# Patient Record
Sex: Female | Born: 1955
Health system: Southern US, Community
[De-identification: ages and names within clinical notes are randomized; demographics above are authoritative.]

## PROBLEM LIST (undated history)

## (undated) DIAGNOSIS — C20 Malignant neoplasm of rectum: Secondary | ICD-10-CM

## (undated) DIAGNOSIS — T451X5A Adverse effect of antineoplastic and immunosuppressive drugs, initial encounter: Secondary | ICD-10-CM

## (undated) DIAGNOSIS — Z85048 Personal history of other malignant neoplasm of rectum, rectosigmoid junction, and anus: Secondary | ICD-10-CM

## (undated) DIAGNOSIS — G620 Drug-induced polyneuropathy: Secondary | ICD-10-CM

## (undated) DIAGNOSIS — N870 Mild cervical dysplasia: Secondary | ICD-10-CM

## (undated) DIAGNOSIS — N879 Dysplasia of cervix uteri, unspecified: Secondary | ICD-10-CM

## (undated) DIAGNOSIS — N871 Moderate cervical dysplasia: Secondary | ICD-10-CM

## (undated) DIAGNOSIS — M1711 Unilateral primary osteoarthritis, right knee: Secondary | ICD-10-CM

## (undated) DIAGNOSIS — Z8719 Personal history of other diseases of the digestive system: Secondary | ICD-10-CM

## (undated) DIAGNOSIS — R1013 Epigastric pain: Secondary | ICD-10-CM

## (undated) DIAGNOSIS — R195 Other fecal abnormalities: Secondary | ICD-10-CM

## (undated) DIAGNOSIS — R1032 Left lower quadrant pain: Secondary | ICD-10-CM

## (undated) DIAGNOSIS — C189 Malignant neoplasm of colon, unspecified: Secondary | ICD-10-CM

## (undated) DIAGNOSIS — D649 Anemia, unspecified: Secondary | ICD-10-CM

## (undated) DIAGNOSIS — R112 Nausea with vomiting, unspecified: Secondary | ICD-10-CM

## (undated) DIAGNOSIS — K219 Gastro-esophageal reflux disease without esophagitis: Secondary | ICD-10-CM

## (undated) DIAGNOSIS — I10 Essential (primary) hypertension: Secondary | ICD-10-CM

## (undated) DIAGNOSIS — E7800 Pure hypercholesterolemia, unspecified: Secondary | ICD-10-CM

## (undated) HISTORY — DX: Adverse effect of antineoplastic and immunosuppressive drugs, initial encounter: T45.1X5A

## (undated) HISTORY — DX: Malignant neoplasm of colon, unspecified: C18.9

## (undated) HISTORY — DX: Personal history of other malignant neoplasm of rectum, rectosigmoid junction, and anus: Z85.048

## (undated) HISTORY — DX: Nausea with vomiting, unspecified: R11.2

## (undated) HISTORY — DX: Pure hypercholesterolemia, unspecified: E78.00

## (undated) HISTORY — PX: MOUTH SURGERY: SHX715

## (undated) HISTORY — DX: Other fecal abnormalities: R19.5

## (undated) HISTORY — DX: Dysplasia of cervix uteri, unspecified: N87.9

## (undated) HISTORY — DX: Gastro-esophageal reflux disease without esophagitis: K21.9

## (undated) HISTORY — DX: Malignant neoplasm of rectum: C20

## (undated) HISTORY — DX: Epigastric pain: R10.13

## (undated) HISTORY — DX: Anemia, unspecified: D64.9

## (undated) HISTORY — DX: Unilateral primary osteoarthritis, right knee: M17.11

## (undated) HISTORY — DX: Mild cervical dysplasia: N87.0

## (undated) HISTORY — DX: Left lower quadrant pain: R10.32

## (undated) HISTORY — DX: Moderate cervical dysplasia: N87.1

## (undated) HISTORY — DX: Personal history of other diseases of the digestive system: Z87.19

## (undated) HISTORY — PX: OTHER SURGICAL HISTORY: SHX169

## (undated) HISTORY — PX: COLON SURGERY: SHX602

## (undated) HISTORY — DX: Essential (primary) hypertension: I10

## (undated) HISTORY — DX: Drug-induced polyneuropathy: G62.0

---

## 2004-05-21 ENCOUNTER — Other Ambulatory Visit: Admission: RE | Admit: 2004-05-21 | Discharge: 2004-05-21 | Payer: Self-pay | Admitting: Family Medicine

## 2006-06-10 ENCOUNTER — Other Ambulatory Visit: Admission: RE | Admit: 2006-06-10 | Discharge: 2006-06-10 | Payer: Self-pay | Admitting: Family Medicine

## 2006-09-08 ENCOUNTER — Encounter (INDEPENDENT_AMBULATORY_CARE_PROVIDER_SITE_OTHER): Payer: Self-pay | Admitting: *Deleted

## 2006-09-08 ENCOUNTER — Ambulatory Visit (HOSPITAL_COMMUNITY): Admission: RE | Admit: 2006-09-08 | Discharge: 2006-09-08 | Payer: Self-pay | Admitting: Gastroenterology

## 2007-10-20 ENCOUNTER — Ambulatory Visit (HOSPITAL_COMMUNITY): Admission: RE | Admit: 2007-10-20 | Discharge: 2007-10-20 | Payer: Self-pay | Admitting: Gastroenterology

## 2007-10-20 ENCOUNTER — Encounter (INDEPENDENT_AMBULATORY_CARE_PROVIDER_SITE_OTHER): Payer: Self-pay | Admitting: Gastroenterology

## 2007-10-29 ENCOUNTER — Encounter: Admission: RE | Admit: 2007-10-29 | Discharge: 2007-10-29 | Payer: Self-pay | Admitting: General Surgery

## 2007-12-01 ENCOUNTER — Encounter (INDEPENDENT_AMBULATORY_CARE_PROVIDER_SITE_OTHER): Payer: Self-pay | Admitting: General Surgery

## 2007-12-01 ENCOUNTER — Inpatient Hospital Stay (HOSPITAL_COMMUNITY): Admission: RE | Admit: 2007-12-01 | Discharge: 2007-12-05 | Payer: Self-pay | Admitting: General Surgery

## 2007-12-15 ENCOUNTER — Ambulatory Visit: Payer: Self-pay | Admitting: Oncology

## 2007-12-30 ENCOUNTER — Ambulatory Visit: Admission: RE | Admit: 2007-12-30 | Discharge: 2008-02-29 | Payer: Self-pay | Admitting: Radiation Oncology

## 2008-01-12 LAB — CBC WITH DIFFERENTIAL/PLATELET
Basophils Absolute: 0 10*3/uL (ref 0.0–0.1)
EOS%: 4.2 % (ref 0.0–7.0)
HCT: 34 % — ABNORMAL LOW (ref 34.8–46.6)
HGB: 11.4 g/dL — ABNORMAL LOW (ref 11.6–15.9)
MCH: 28.5 pg (ref 26.0–34.0)
MCV: 84.7 fL (ref 81.0–101.0)
MONO%: 4.7 % (ref 0.0–13.0)
NEUT%: 64.8 % (ref 39.6–76.8)
RDW: 13.5 % (ref 11.3–14.5)

## 2008-01-19 LAB — COMPREHENSIVE METABOLIC PANEL
AST: 15 U/L (ref 0–37)
Albumin: 4.2 g/dL (ref 3.5–5.2)
Alkaline Phosphatase: 71 U/L (ref 39–117)
Potassium: 3.9 mEq/L (ref 3.5–5.3)
Sodium: 141 mEq/L (ref 135–145)
Total Protein: 7.2 g/dL (ref 6.0–8.3)

## 2008-01-19 LAB — CBC WITH DIFFERENTIAL/PLATELET
EOS%: 4.4 % (ref 0.0–7.0)
MCH: 28.3 pg (ref 26.0–34.0)
MCV: 83.9 fL (ref 81.0–101.0)
MONO%: 7.2 % (ref 0.0–13.0)
RBC: 3.8 10*6/uL (ref 3.70–5.32)
RDW: 13.4 % (ref 11.3–14.5)

## 2008-01-26 ENCOUNTER — Ambulatory Visit: Payer: Self-pay | Admitting: Oncology

## 2008-01-27 LAB — CBC WITH DIFFERENTIAL/PLATELET
Basophils Absolute: 0 10*3/uL (ref 0.0–0.1)
Eosinophils Absolute: 0.2 10*3/uL (ref 0.0–0.5)
HCT: 34.3 % — ABNORMAL LOW (ref 34.8–46.6)
HGB: 11 g/dL — ABNORMAL LOW (ref 11.6–15.9)
MCH: 28 pg (ref 26.0–34.0)
MONO#: 0.3 10*3/uL (ref 0.1–0.9)
NEUT%: 81.2 % — ABNORMAL HIGH (ref 39.6–76.8)
WBC: 4.3 10*3/uL (ref 3.9–10.0)
lymph#: 0.3 10*3/uL — ABNORMAL LOW (ref 0.9–3.3)

## 2008-02-02 LAB — CBC WITH DIFFERENTIAL/PLATELET
Basophils Absolute: 0 10*3/uL (ref 0.0–0.1)
EOS%: 8 % — ABNORMAL HIGH (ref 0.0–7.0)
HCT: 32.7 % — ABNORMAL LOW (ref 34.8–46.6)
HGB: 11 g/dL — ABNORMAL LOW (ref 11.6–15.9)
MCH: 28.4 pg (ref 26.0–34.0)
MCV: 84.1 fL (ref 81.0–101.0)
MONO%: 8.9 % (ref 0.0–13.0)
NEUT%: 75.5 % (ref 39.6–76.8)
Platelets: 188 10*3/uL (ref 145–400)

## 2008-03-01 LAB — CBC WITH DIFFERENTIAL/PLATELET
Basophils Absolute: 0.1 10*3/uL (ref 0.0–0.1)
EOS%: 3 % (ref 0.0–7.0)
Eosinophils Absolute: 0.2 10*3/uL (ref 0.0–0.5)
LYMPH%: 8.2 % — ABNORMAL LOW (ref 14.0–48.0)
MCH: 28.7 pg (ref 26.0–34.0)
MCV: 87.4 fL (ref 81.0–101.0)
MONO%: 12.3 % (ref 0.0–13.0)
NEUT#: 3.9 10*3/uL (ref 1.5–6.5)
Platelets: 185 10*3/uL (ref 145–400)
RBC: 3.91 10*6/uL (ref 3.70–5.32)

## 2008-03-01 LAB — COMPREHENSIVE METABOLIC PANEL
AST: 20 U/L (ref 0–37)
Alkaline Phosphatase: 79 U/L (ref 39–117)
BUN: 13 mg/dL (ref 6–23)
Glucose, Bld: 95 mg/dL (ref 70–99)
Sodium: 137 mEq/L (ref 135–145)
Total Bilirubin: 0.6 mg/dL (ref 0.3–1.2)

## 2008-03-18 ENCOUNTER — Ambulatory Visit: Payer: Self-pay | Admitting: Oncology

## 2008-03-22 LAB — CBC WITH DIFFERENTIAL/PLATELET
BASO%: 0.7 % (ref 0.0–2.0)
EOS%: 1.6 % (ref 0.0–7.0)
HCT: 34.6 % — ABNORMAL LOW (ref 34.8–46.6)
LYMPH%: 11.8 % — ABNORMAL LOW (ref 14.0–48.0)
MCH: 29.3 pg (ref 26.0–34.0)
MCHC: 33 g/dL (ref 32.0–36.0)
MCV: 89 fL (ref 81.0–101.0)
MONO#: 0.5 10*3/uL (ref 0.1–0.9)
NEUT%: 73.2 % (ref 39.6–76.8)
Platelets: 170 10*3/uL (ref 145–400)

## 2008-03-22 LAB — COMPREHENSIVE METABOLIC PANEL
ALT: 22 U/L (ref 0–35)
CO2: 20 mEq/L (ref 19–32)
Creatinine, Ser: 0.67 mg/dL (ref 0.40–1.20)
Total Bilirubin: 0.4 mg/dL (ref 0.3–1.2)

## 2008-03-22 LAB — CEA: CEA: <0.5 ng/mL (ref 0.0–5.0)

## 2008-04-07 ENCOUNTER — Encounter: Admission: RE | Admit: 2008-04-07 | Discharge: 2008-04-07 | Payer: Self-pay | Admitting: General Surgery

## 2008-04-12 LAB — CBC WITH DIFFERENTIAL/PLATELET
Basophils Absolute: 0.1 10*3/uL (ref 0.0–0.1)
Eosinophils Absolute: 0 10*3/uL (ref 0.0–0.5)
HCT: 32.2 % — ABNORMAL LOW (ref 34.8–46.6)
HGB: 10.3 g/dL — ABNORMAL LOW (ref 11.6–15.9)
MCH: 28.7 pg (ref 26.0–34.0)
MCV: 89.5 fL (ref 81.0–101.0)
MONO%: 19.8 % — ABNORMAL HIGH (ref 0.0–13.0)
NEUT#: 1.4 10*3/uL — ABNORMAL LOW (ref 1.5–6.5)
NEUT%: 60.1 % (ref 39.6–76.8)
Platelets: 175 10*3/uL (ref 145–400)
RDW: 19.9 % — ABNORMAL HIGH (ref 11.3–14.5)

## 2008-04-27 ENCOUNTER — Encounter (INDEPENDENT_AMBULATORY_CARE_PROVIDER_SITE_OTHER): Payer: Self-pay | Admitting: General Surgery

## 2008-04-27 ENCOUNTER — Inpatient Hospital Stay (HOSPITAL_COMMUNITY): Admission: RE | Admit: 2008-04-27 | Discharge: 2008-04-30 | Payer: Self-pay | Admitting: General Surgery

## 2008-04-30 ENCOUNTER — Emergency Department (HOSPITAL_COMMUNITY): Admission: EM | Admit: 2008-04-30 | Discharge: 2008-04-30 | Payer: Self-pay | Admitting: Emergency Medicine

## 2008-05-06 ENCOUNTER — Ambulatory Visit: Payer: Self-pay | Admitting: Oncology

## 2008-05-10 LAB — CBC WITH DIFFERENTIAL/PLATELET
Basophils Absolute: 0.1 10*3/uL (ref 0.0–0.1)
Eosinophils Absolute: 0.1 10*3/uL (ref 0.0–0.5)
HCT: 31.8 % — ABNORMAL LOW (ref 34.8–46.6)
HGB: 10.2 g/dL — ABNORMAL LOW (ref 11.6–15.9)
MONO#: 0.6 10*3/uL (ref 0.1–0.9)
NEUT#: 3.1 10*3/uL (ref 1.5–6.5)
NEUT%: 70.7 % (ref 39.6–76.8)
RDW: 17.2 % — ABNORMAL HIGH (ref 11.3–14.5)
lymph#: 0.6 10*3/uL — ABNORMAL LOW (ref 0.9–3.3)

## 2008-05-10 LAB — COMPREHENSIVE METABOLIC PANEL
AST: 16 U/L (ref 0–37)
Albumin: 4.1 g/dL (ref 3.5–5.2)
BUN: 10 mg/dL (ref 6–23)
Calcium: 9.1 mg/dL (ref 8.4–10.5)
Chloride: 106 mEq/L (ref 96–112)
Creatinine, Ser: 0.64 mg/dL (ref 0.40–1.20)
Glucose, Bld: 77 mg/dL (ref 70–99)
Potassium: 3.9 mEq/L (ref 3.5–5.3)

## 2008-05-31 LAB — CBC WITH DIFFERENTIAL/PLATELET
BASO%: 1 % (ref 0.0–2.0)
EOS%: 1.1 % (ref 0.0–7.0)
HCT: 30.1 % — ABNORMAL LOW (ref 34.8–46.6)
LYMPH%: 12.2 % — ABNORMAL LOW (ref 14.0–48.0)
MCH: 30.2 pg (ref 26.0–34.0)
MCHC: 32.1 g/dL (ref 32.0–36.0)
MONO%: 14.7 % — ABNORMAL HIGH (ref 0.0–13.0)
NEUT%: 71 % (ref 39.6–76.8)
Platelets: 174 10*3/uL (ref 145–400)

## 2008-05-31 LAB — COMPREHENSIVE METABOLIC PANEL
ALT: 16 U/L (ref 0–35)
Alkaline Phosphatase: 67 U/L (ref 39–117)
CO2: 24 mEq/L (ref 19–32)
Sodium: 139 mEq/L (ref 135–145)
Total Bilirubin: 0.4 mg/dL (ref 0.3–1.2)
Total Protein: 6.7 g/dL (ref 6.0–8.3)

## 2008-05-31 LAB — CEA: CEA: <0.5 ng/mL (ref 0.0–5.0)

## 2008-06-13 ENCOUNTER — Other Ambulatory Visit: Admission: RE | Admit: 2008-06-13 | Discharge: 2008-06-13 | Payer: Self-pay | Admitting: Family Medicine

## 2008-06-21 ENCOUNTER — Ambulatory Visit: Payer: Self-pay | Admitting: Oncology

## 2008-06-21 LAB — CBC WITH DIFFERENTIAL/PLATELET
Basophils Absolute: 0.1 10*3/uL (ref 0.0–0.1)
EOS%: 1.5 % (ref 0.0–7.0)
Eosinophils Absolute: 0.1 10*3/uL (ref 0.0–0.5)
HCT: 35.3 % (ref 34.8–46.6)
HGB: 11.1 g/dL — ABNORMAL LOW (ref 11.6–15.9)
MCH: 31 pg (ref 26.0–34.0)
MCV: 98.1 fL (ref 81.0–101.0)
NEUT%: 68.9 % (ref 39.6–76.8)
lymph#: 0.6 10*3/uL — ABNORMAL LOW (ref 0.9–3.3)

## 2008-06-21 LAB — CEA: CEA: <0.5 ng/mL (ref 0.0–5.0)

## 2008-06-21 LAB — COMPREHENSIVE METABOLIC PANEL
ALT: 26 U/L (ref 0–35)
AST: 32 U/L (ref 0–37)
Creatinine, Ser: 0.74 mg/dL (ref 0.40–1.20)
Total Bilirubin: 0.7 mg/dL (ref 0.3–1.2)

## 2008-07-12 LAB — CBC WITH DIFFERENTIAL/PLATELET
Basophils Absolute: 0 10*3/uL (ref 0.0–0.1)
EOS%: 0.7 % (ref 0.0–7.0)
HGB: 10.8 g/dL — ABNORMAL LOW (ref 11.6–15.9)
LYMPH%: 14.3 % (ref 14.0–48.0)
MCH: 30.9 pg (ref 26.0–34.0)
MCV: 96.1 fL (ref 81.0–101.0)
MONO%: 16.5 % — ABNORMAL HIGH (ref 0.0–13.0)
NEUT%: 67.4 % (ref 39.6–76.8)
Platelets: 159 10*3/uL (ref 145–400)
RDW: 19.4 % — ABNORMAL HIGH (ref 11.3–14.5)

## 2008-07-12 LAB — COMPREHENSIVE METABOLIC PANEL
AST: 33 U/L (ref 0–37)
Alkaline Phosphatase: 86 U/L (ref 39–117)
BUN: 9 mg/dL (ref 6–23)
Creatinine, Ser: 0.69 mg/dL (ref 0.40–1.20)
Potassium: 3.6 mEq/L (ref 3.5–5.3)
Total Bilirubin: 0.7 mg/dL (ref 0.3–1.2)

## 2008-08-02 LAB — COMPREHENSIVE METABOLIC PANEL
ALT: 15 U/L (ref 0–35)
AST: 29 U/L (ref 0–37)
Alkaline Phosphatase: 104 U/L (ref 39–117)
Calcium: 8.8 mg/dL (ref 8.4–10.5)
Chloride: 105 mEq/L (ref 96–112)
Creatinine, Ser: 0.64 mg/dL (ref 0.40–1.20)
Potassium: 3.6 mEq/L (ref 3.5–5.3)

## 2008-08-02 LAB — CBC WITH DIFFERENTIAL/PLATELET
Basophils Absolute: 0 10*3/uL (ref 0.0–0.1)
Eosinophils Absolute: 0 10*3/uL (ref 0.0–0.5)
HGB: 11.1 g/dL — ABNORMAL LOW (ref 11.6–15.9)
LYMPH%: 15.4 % (ref 14.0–48.0)
MCV: 97.1 fL (ref 81.0–101.0)
MONO#: 0.5 10*3/uL (ref 0.1–0.9)
MONO%: 15.4 % — ABNORMAL HIGH (ref 0.0–13.0)
NEUT#: 2.3 10*3/uL (ref 1.5–6.5)
Platelets: 107 10*3/uL — ABNORMAL LOW (ref 145–400)
RBC: 3.54 10*6/uL — ABNORMAL LOW (ref 3.70–5.32)
WBC: 3.4 10*3/uL — ABNORMAL LOW (ref 3.9–10.0)

## 2008-08-17 ENCOUNTER — Ambulatory Visit: Payer: Self-pay | Admitting: Oncology

## 2008-08-23 LAB — CBC WITH DIFFERENTIAL/PLATELET
Basophils Absolute: 0 10*3/uL (ref 0.0–0.1)
EOS%: 0.5 % (ref 0.0–7.0)
Eosinophils Absolute: 0 10*3/uL (ref 0.0–0.5)
HCT: 33.1 % — ABNORMAL LOW (ref 34.8–46.6)
HGB: 10.9 g/dL — ABNORMAL LOW (ref 11.6–15.9)
LYMPH%: 15.9 % (ref 14.0–48.0)
MCH: 32.2 pg (ref 26.0–34.0)
MCV: 97.6 fL (ref 81.0–101.0)
MONO%: 16.8 % — ABNORMAL HIGH (ref 0.0–13.0)
NEUT#: 2.2 10*3/uL (ref 1.5–6.5)
NEUT%: 65.8 % (ref 39.6–76.8)
Platelets: 60 10*3/uL — ABNORMAL LOW (ref 145–400)
RDW: 21.7 % — ABNORMAL HIGH (ref 11.3–14.5)

## 2008-08-23 LAB — COMPREHENSIVE METABOLIC PANEL
AST: 26 U/L (ref 0–37)
Albumin: 4.2 g/dL (ref 3.5–5.2)
Alkaline Phosphatase: 106 U/L (ref 39–117)
BUN: 11 mg/dL (ref 6–23)
Creatinine, Ser: 0.68 mg/dL (ref 0.40–1.20)
Glucose, Bld: 99 mg/dL (ref 70–99)
Total Bilirubin: 0.8 mg/dL (ref 0.3–1.2)

## 2008-09-26 ENCOUNTER — Ambulatory Visit: Payer: Self-pay | Admitting: Oncology

## 2008-09-28 ENCOUNTER — Ambulatory Visit (HOSPITAL_COMMUNITY): Admission: RE | Admit: 2008-09-28 | Discharge: 2008-09-28 | Payer: Self-pay | Admitting: Oncology

## 2008-09-28 LAB — CBC WITH DIFFERENTIAL/PLATELET
BASO%: 0.3 % (ref 0.0–2.0)
Basophils Absolute: 0 10*3/uL (ref 0.0–0.1)
EOS%: 0.4 % (ref 0.0–7.0)
Eosinophils Absolute: 0 10*3/uL (ref 0.0–0.5)
HCT: 36.7 % (ref 34.8–46.6)
HGB: 12.3 g/dL (ref 11.6–15.9)
LYMPH%: 11 % — ABNORMAL LOW (ref 14.0–48.0)
MCH: 34.1 pg — ABNORMAL HIGH (ref 26.0–34.0)
MCHC: 33.6 g/dL (ref 32.0–36.0)
MCV: 101.5 fL — ABNORMAL HIGH (ref 81.0–101.0)
MONO#: 0.3 10*3/uL (ref 0.1–0.9)
MONO%: 9.4 % (ref 0.0–13.0)
NEUT#: 2.8 10*3/uL (ref 1.5–6.5)
NEUT%: 78.9 % — ABNORMAL HIGH (ref 39.6–76.8)
Platelets: 130 10*3/uL — ABNORMAL LOW (ref 145–400)
RBC: 3.62 10*6/uL — ABNORMAL LOW (ref 3.70–5.32)
RDW: 17.9 % — ABNORMAL HIGH (ref 11.3–14.5)
WBC: 3.6 10*3/uL — ABNORMAL LOW (ref 3.9–10.0)
lymph#: 0.4 10*3/uL — ABNORMAL LOW (ref 0.9–3.3)

## 2008-09-28 LAB — COMPREHENSIVE METABOLIC PANEL
ALT: 18 U/L (ref 0–35)
AST: 34 U/L (ref 0–37)
Alkaline Phosphatase: 117 U/L (ref 39–117)
BUN: 10 mg/dL (ref 6–23)
Calcium: 9.2 mg/dL (ref 8.4–10.5)
Chloride: 103 mEq/L (ref 96–112)
Creatinine, Ser: 0.73 mg/dL (ref 0.40–1.20)

## 2008-12-22 ENCOUNTER — Ambulatory Visit: Payer: Self-pay | Admitting: Oncology

## 2008-12-27 LAB — COMPREHENSIVE METABOLIC PANEL
Albumin: 4.5 g/dL (ref 3.5–5.2)
Alkaline Phosphatase: 102 U/L (ref 39–117)
BUN: 15 mg/dL (ref 6–23)
Calcium: 9.1 mg/dL (ref 8.4–10.5)
Glucose, Bld: 99 mg/dL (ref 70–99)
Potassium: 4.1 mEq/L (ref 3.5–5.3)

## 2008-12-27 LAB — CBC WITH DIFFERENTIAL/PLATELET
Basophils Absolute: 0 10*3/uL (ref 0.0–0.1)
Eosinophils Absolute: 0 10*3/uL (ref 0.0–0.5)
HCT: 36.8 % (ref 34.8–46.6)
HGB: 12.4 g/dL (ref 11.6–15.9)
MCV: 91.4 fL (ref 79.5–101.0)
MONO%: 8.2 % (ref 0.0–14.0)
NEUT#: 3.3 10*3/uL (ref 1.5–6.5)
Platelets: 160 10*3/uL (ref 145–400)
RDW: 12.8 % (ref 11.2–14.5)

## 2009-03-30 ENCOUNTER — Ambulatory Visit: Payer: Self-pay | Admitting: Oncology

## 2009-04-03 ENCOUNTER — Ambulatory Visit (HOSPITAL_COMMUNITY): Admission: RE | Admit: 2009-04-03 | Discharge: 2009-04-03 | Payer: Self-pay | Admitting: Oncology

## 2009-04-03 LAB — CBC WITH DIFFERENTIAL/PLATELET
BASO%: 0.5 % (ref 0.0–2.0)
Basophils Absolute: 0 10*3/uL (ref 0.0–0.1)
EOS%: 1.8 % (ref 0.0–7.0)
HCT: 34.3 % — ABNORMAL LOW (ref 34.8–46.6)
HGB: 11.5 g/dL — ABNORMAL LOW (ref 11.6–15.9)
MCH: 29.1 pg (ref 25.1–34.0)
MCHC: 33.5 g/dL (ref 31.5–36.0)
MCV: 86.8 fL (ref 79.5–101.0)
MONO%: 9 % (ref 0.0–14.0)
NEUT%: 65.7 % (ref 38.4–76.8)
RDW: 13.3 % (ref 11.2–14.5)
lymph#: 0.9 10*3/uL (ref 0.9–3.3)

## 2009-04-03 LAB — COMPREHENSIVE METABOLIC PANEL
Albumin: 4 g/dL (ref 3.5–5.2)
Alkaline Phosphatase: 84 U/L (ref 39–117)
BUN: 10 mg/dL (ref 6–23)
Creatinine, Ser: 0.72 mg/dL (ref 0.40–1.20)
Glucose, Bld: 94 mg/dL (ref 70–99)
Total Bilirubin: 0.5 mg/dL (ref 0.3–1.2)

## 2009-07-03 ENCOUNTER — Emergency Department (HOSPITAL_COMMUNITY): Admission: EM | Admit: 2009-07-03 | Discharge: 2009-07-03 | Payer: Self-pay | Admitting: Emergency Medicine

## 2009-07-07 ENCOUNTER — Ambulatory Visit: Payer: Self-pay | Admitting: Oncology

## 2009-07-11 LAB — CBC WITH DIFFERENTIAL/PLATELET
BASO%: 0.4 % (ref 0.0–2.0)
EOS%: 1.8 % (ref 0.0–7.0)
HCT: 35.2 % (ref 34.8–46.6)
MCH: 29.1 pg (ref 25.1–34.0)
MCHC: 33.1 g/dL (ref 31.5–36.0)
MONO#: 0.4 10*3/uL (ref 0.1–0.9)
RBC: 4.01 10*6/uL (ref 3.70–5.45)
RDW: 14.3 % (ref 11.2–14.5)
WBC: 4 10*3/uL (ref 3.9–10.3)
lymph#: 0.7 10*3/uL — ABNORMAL LOW (ref 0.9–3.3)

## 2009-07-11 LAB — COMPREHENSIVE METABOLIC PANEL
ALT: 10 U/L (ref 0–35)
AST: 16 U/L (ref 0–37)
CO2: 25 mEq/L (ref 19–32)
Calcium: 9.3 mg/dL (ref 8.4–10.5)
Chloride: 105 mEq/L (ref 96–112)
Creatinine, Ser: 0.82 mg/dL (ref 0.40–1.20)
Sodium: 143 mEq/L (ref 135–145)
Total Protein: 7.5 g/dL (ref 6.0–8.3)

## 2009-07-11 LAB — CEA: CEA: <0.5 ng/mL (ref 0.0–5.0)

## 2009-09-29 ENCOUNTER — Ambulatory Visit: Payer: Self-pay | Admitting: Oncology

## 2009-10-03 ENCOUNTER — Ambulatory Visit (HOSPITAL_COMMUNITY): Admission: RE | Admit: 2009-10-03 | Discharge: 2009-10-03 | Payer: Self-pay | Admitting: Oncology

## 2009-10-03 LAB — COMPREHENSIVE METABOLIC PANEL
ALT: 14 U/L (ref 0–35)
Albumin: 3.8 g/dL (ref 3.5–5.2)
Alkaline Phosphatase: 93 U/L (ref 39–117)
CO2: 28 mEq/L (ref 19–32)
Calcium: 8.9 mg/dL (ref 8.4–10.5)
Creatinine, Ser: 0.69 mg/dL (ref 0.40–1.20)
Glucose, Bld: 100 mg/dL — ABNORMAL HIGH (ref 70–99)
Sodium: 138 mEq/L (ref 135–145)
Total Bilirubin: 0.4 mg/dL (ref 0.3–1.2)
Total Protein: 7.5 g/dL (ref 6.0–8.3)

## 2009-10-03 LAB — CBC WITH DIFFERENTIAL/PLATELET
BASO%: 0.4 % (ref 0.0–2.0)
Eosinophils Absolute: 0.1 10*3/uL (ref 0.0–0.5)
HCT: 35.1 % (ref 34.8–46.6)
LYMPH%: 17.2 % (ref 14.0–49.7)
MONO%: 9.4 % (ref 0.0–14.0)
NEUT#: 3.1 10*3/uL (ref 1.5–6.5)
Platelets: 199 10*3/uL (ref 145–400)
RBC: 3.93 10*6/uL (ref 3.70–5.45)

## 2009-10-03 LAB — CEA: CEA: <0.5 ng/mL (ref 0.0–5.0)

## 2009-11-07 IMAGING — CT CT PELVIS W/ CM
2 of 5 series · 16 of 46 positions shown, 18 images · IV contrast (RADICAT/WATER & [ID] OMNI 300)
Comparison: None.
COMPARISON: None.

CLINICAL DATA: Rectal carcinoma, staging.  
ABDOMEN CT WITH CONTRAST:
TECHNIQUE: Multidetector CT imaging of the abdomen was performed following the standard protocol during bolus administration of intravenous contrast.
Contrast:  100 cc of Omnipaque 300.
TECHNIQUE: Multidetector CT imaging of the pelvis was performed following the standard protocol during bolus administration of intravenous contrast.

[Series 2: abdomen w/ · axial · 0.70mm/px · z∈[-410,-20]mm · 13 of 88 slices shown, 15 images]
[im 5/88  soft-tissue]
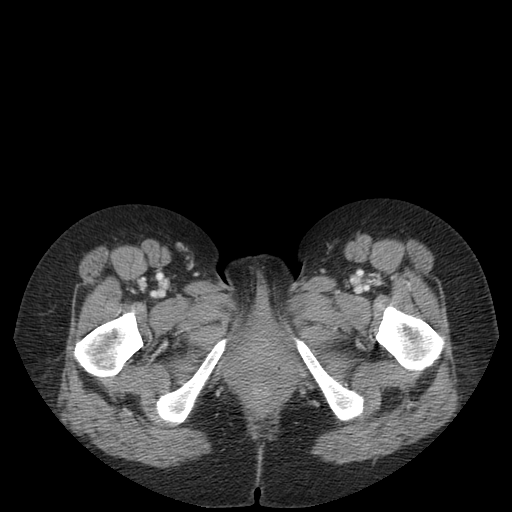
[im 5/88  bone]
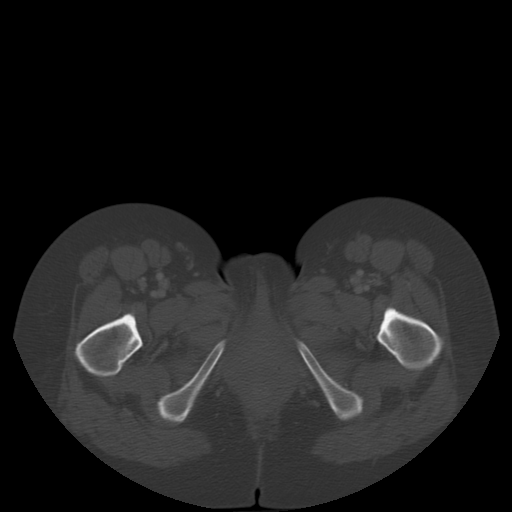
[im 14/88  soft-tissue]
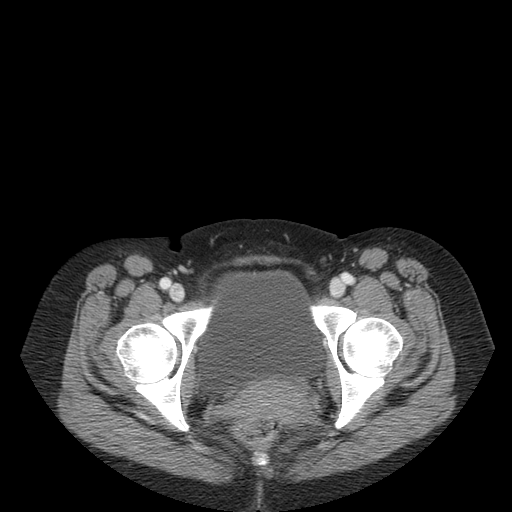
[im 19/88  soft-tissue]
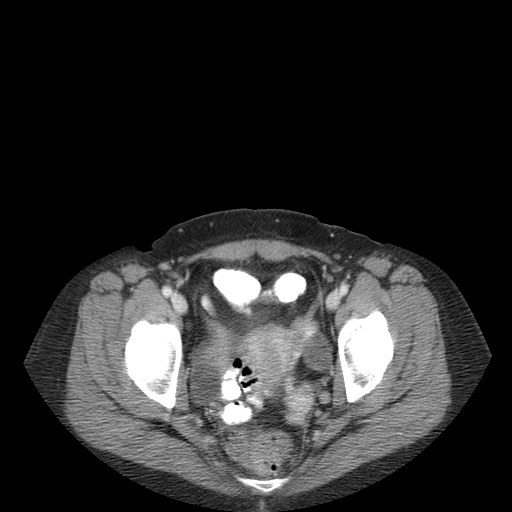
[im 23/88  soft-tissue]
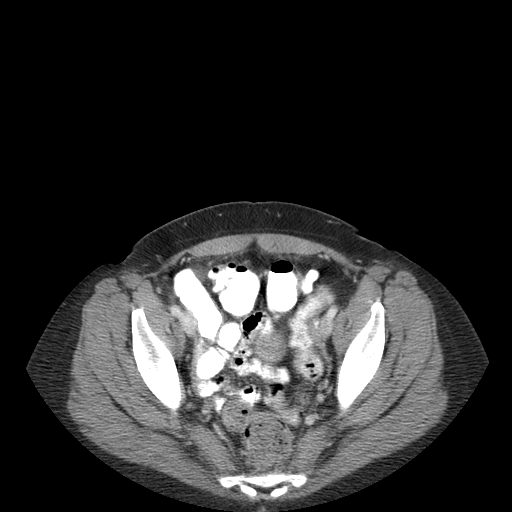
[im 33/88  soft-tissue]
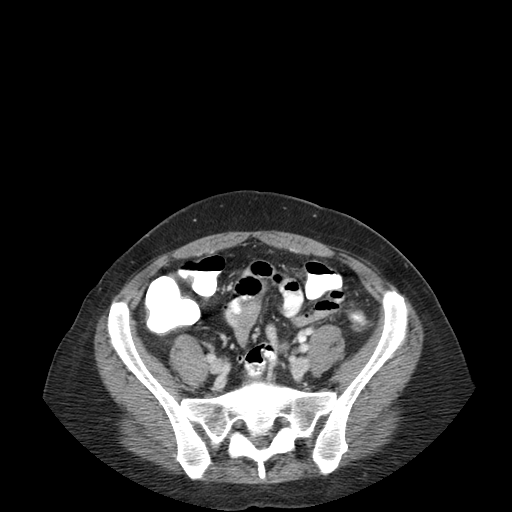
[im 37/88  soft-tissue]
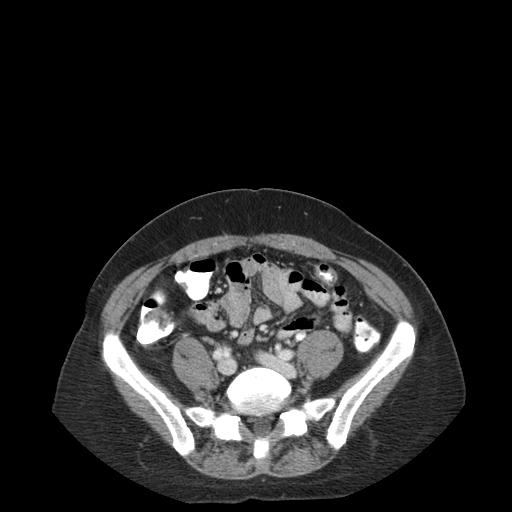
[im 46/88  soft-tissue]
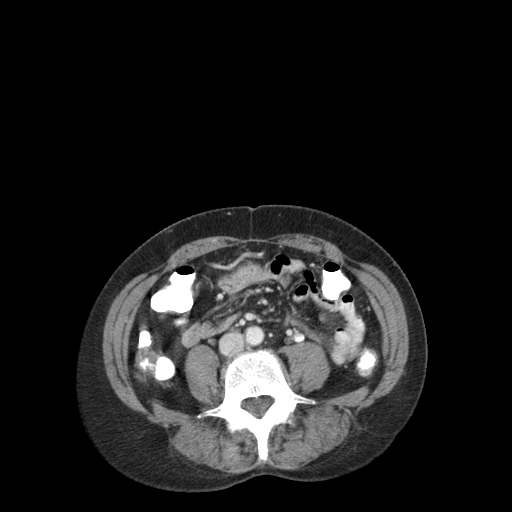
[im 51/88  soft-tissue]
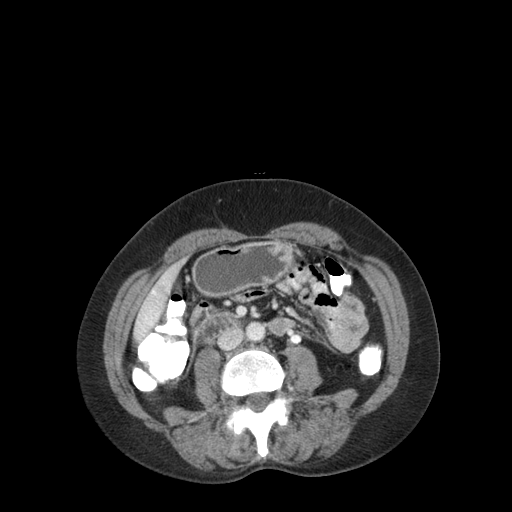
[im 55/88  soft-tissue]
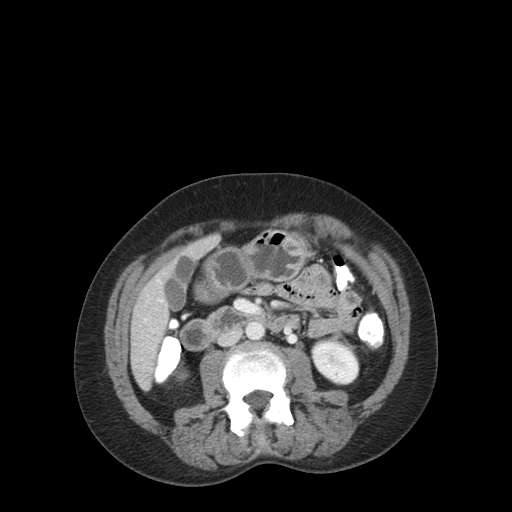
[im 55/88  bone]
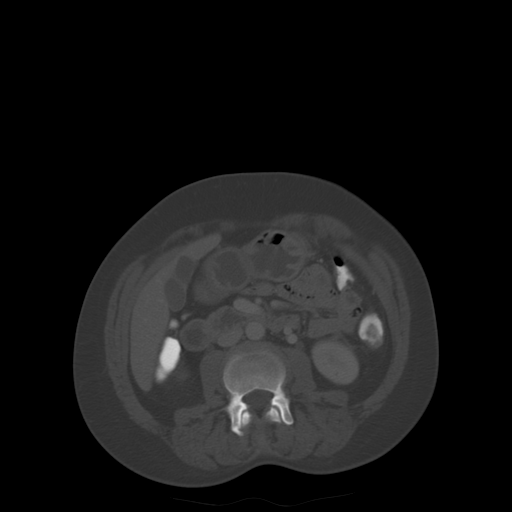
[im 65/88  soft-tissue]
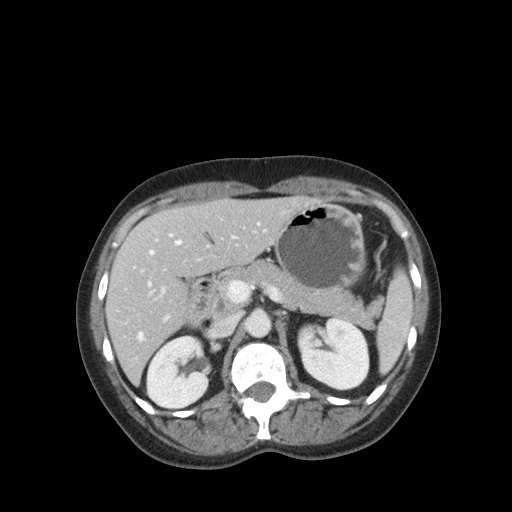
[im 69/88  soft-tissue]
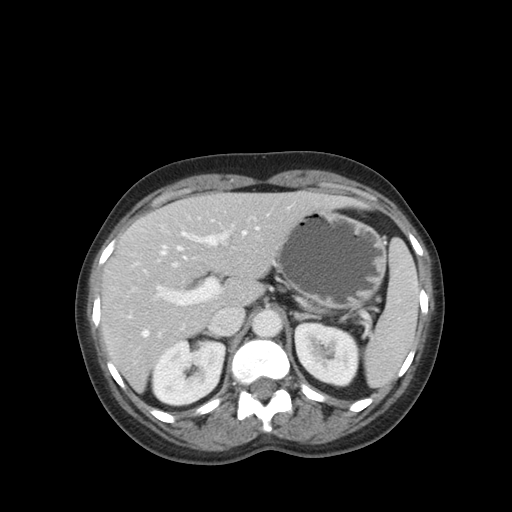
[im 74/88  soft-tissue]
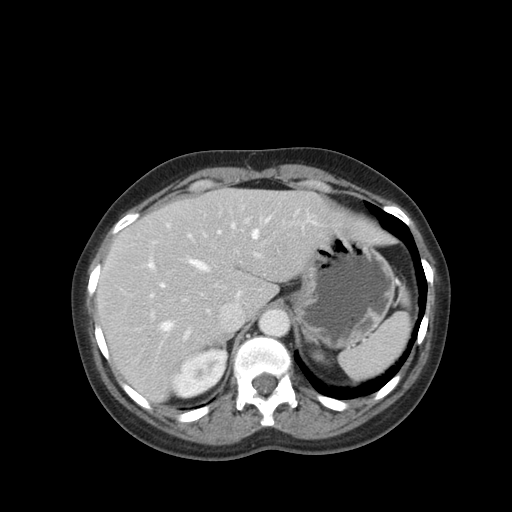
[im 83/88  soft-tissue]
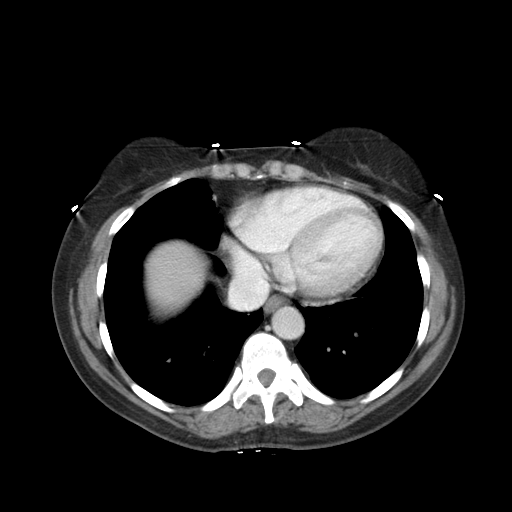

[Series 401: coronal · coronal · 0.87mm/px · 3 of 105 slices shown]
[im 35/105  soft-tissue]
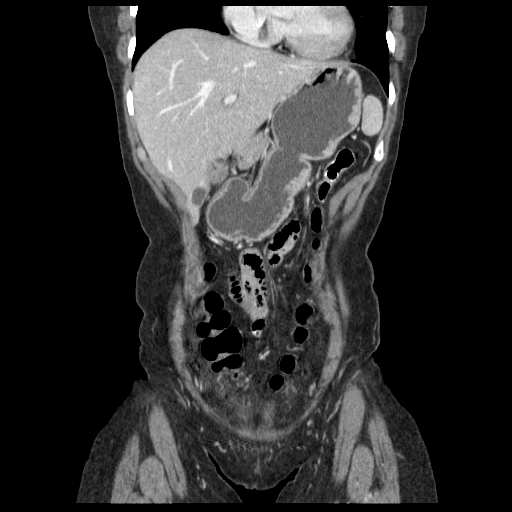
[im 47/105  soft-tissue]
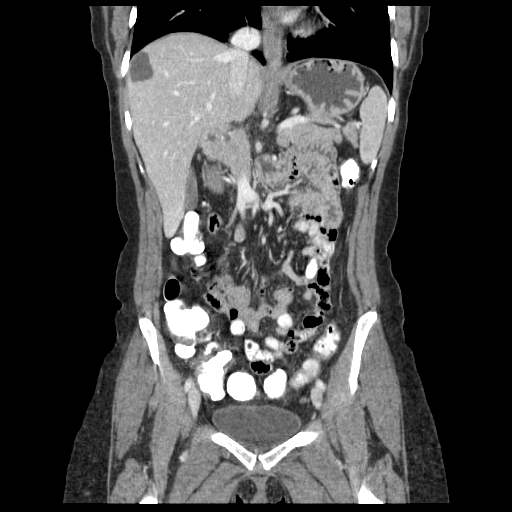
[im 58/105  soft-tissue]
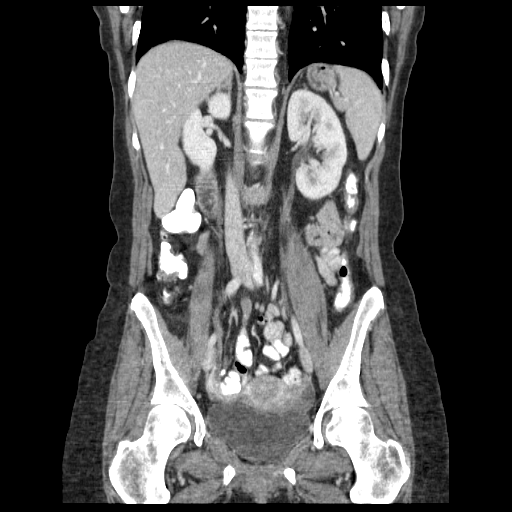

[16 of 46 positions shown; findings below may reference images not displayed]

FINDINGS: ubcapsular right hepatic low density cyst measures 2.1 cm long (sagittal image 21) by 2.1 cm AP by 2.2 cm wide (axial image 12).  4 mm anterior right dome liver low density focus (sagittal image 21) is too small to accurately characterize.  No other focal liver lesions are seen.  Base of lungs are clear.  No mesenteric nor retroperitoneal adenopathy is seen.  Slight dextrorotary scoliosis of the lumbar spine is noted with moderately severe bilateral L4-5 facet degenerative joint disease with no other significant osseous lesions.  Slight calcification of distal abdominal aorta of normal caliber noted.  Remaining abdominal organs appear normal without inflammation nor free fluid.
IMPRESSION: 1.  No evidence for metastatic disease.
2.  2.2 cm subcapsular right hepatic cyst with tiny indeterminate anterior superior right hepatic low density focus ? favor incidental cyst statistically. 
3.  No acute findings.
PELVIS CT WITH CONTRAST:
FINDINGS: No locally invasive nor metastatic rectal carcinoma findings seen without adenopathy.  Bilateral ovaries demonstrate cystic changes with the right measuring 2.8 cm AP by 1.9 cm wide and left 2.7 cm AP by 1.6 cm wide.  Heterogeneous myometrium favors subtle fibroid changes although uterine adenomyosis is possible. Remaining intestine appears normal as does distal urinary tract.
IMPRESSION: 1.  No CT evidence for locally invasive nor metastatic rectal carcinoma.
2.  Bilateral ovarian cysts with heterogeneous myometrium, as described.
3.  Otherwise negative.

## 2009-12-29 ENCOUNTER — Ambulatory Visit: Payer: Self-pay | Admitting: Oncology

## 2010-01-02 LAB — CBC WITH DIFFERENTIAL/PLATELET
BASO%: 0.3 % (ref 0.0–2.0)
Eosinophils Absolute: 0.1 10*3/uL (ref 0.0–0.5)
HCT: 35.3 % (ref 34.8–46.6)
MCH: 29.9 pg (ref 25.1–34.0)
MONO#: 0.4 10*3/uL (ref 0.1–0.9)
MONO%: 9.5 % (ref 0.0–14.0)
RDW: 14.3 % (ref 11.2–14.5)
WBC: 3.8 10*3/uL — ABNORMAL LOW (ref 3.9–10.3)

## 2010-01-02 LAB — COMPREHENSIVE METABOLIC PANEL
ALT: 9 U/L (ref 0–35)
Albumin: 4.5 g/dL (ref 3.5–5.2)
Alkaline Phosphatase: 72 U/L (ref 39–117)
BUN: 14 mg/dL (ref 6–23)
CO2: 25 mEq/L (ref 19–32)
Chloride: 106 mEq/L (ref 96–112)
Total Bilirubin: 0.5 mg/dL (ref 0.3–1.2)

## 2010-04-06 ENCOUNTER — Ambulatory Visit: Payer: Self-pay | Admitting: Oncology

## 2010-04-10 ENCOUNTER — Ambulatory Visit (HOSPITAL_COMMUNITY): Admission: RE | Admit: 2010-04-10 | Discharge: 2010-04-10 | Payer: Self-pay | Admitting: Oncology

## 2010-04-10 LAB — CBC WITH DIFFERENTIAL/PLATELET
BASO%: 0.3 % (ref 0.0–2.0)
Basophils Absolute: 0 10*3/uL (ref 0.0–0.1)
Eosinophils Absolute: 0.1 10*3/uL (ref 0.0–0.5)
HGB: 11.8 g/dL (ref 11.6–15.9)
MCH: 29.6 pg (ref 25.1–34.0)
MCHC: 33.7 g/dL (ref 31.5–36.0)
MCV: 87.8 fL (ref 79.5–101.0)
MONO#: 0.4 10*3/uL (ref 0.1–0.9)
MONO%: 9.6 % (ref 0.0–14.0)
NEUT#: 2.5 10*3/uL (ref 1.5–6.5)
RDW: 14.4 % (ref 11.2–14.5)
WBC: 3.9 10*3/uL (ref 3.9–10.3)
lymph#: 1 10*3/uL (ref 0.9–3.3)

## 2010-04-10 LAB — COMPREHENSIVE METABOLIC PANEL
ALT: 14 U/L (ref 0–35)
AST: 23 U/L (ref 0–37)
Albumin: 4 g/dL (ref 3.5–5.2)
Calcium: 8.8 mg/dL (ref 8.4–10.5)
Creatinine, Ser: 0.75 mg/dL (ref 0.40–1.20)
Glucose, Bld: 98 mg/dL (ref 70–99)
Potassium: 3.7 mEq/L (ref 3.5–5.3)

## 2010-04-10 LAB — CEA: CEA: <0.5 ng/mL (ref 0.0–5.0)

## 2010-07-02 ENCOUNTER — Other Ambulatory Visit: Admission: RE | Admit: 2010-07-02 | Discharge: 2010-07-02 | Payer: Self-pay | Admitting: Family Medicine

## 2010-07-13 ENCOUNTER — Ambulatory Visit: Payer: Self-pay | Admitting: Oncology

## 2010-07-17 LAB — CBC WITH DIFFERENTIAL/PLATELET
HCT: 35.6 % (ref 34.8–46.6)
HGB: 11.8 g/dL (ref 11.6–15.9)
NEUT%: 64.4 % (ref 38.4–76.8)
RDW: 14.1 % (ref 11.2–14.5)
WBC: 4.2 10*3/uL (ref 3.9–10.3)
lymph#: 1.1 10*3/uL (ref 0.9–3.3)

## 2010-07-17 LAB — COMPREHENSIVE METABOLIC PANEL
Albumin: 4.5 g/dL (ref 3.5–5.2)
Alkaline Phosphatase: 77 U/L (ref 39–117)
Potassium: 4 mEq/L (ref 3.5–5.3)
Total Bilirubin: 0.4 mg/dL (ref 0.3–1.2)

## 2010-10-05 ENCOUNTER — Ambulatory Visit: Payer: Self-pay | Admitting: Oncology

## 2010-10-09 ENCOUNTER — Ambulatory Visit (HOSPITAL_COMMUNITY)
Admission: RE | Admit: 2010-10-09 | Discharge: 2010-10-09 | Payer: Self-pay | Source: Home / Self Care | Attending: Oncology | Admitting: Oncology

## 2010-10-09 LAB — CBC WITH DIFFERENTIAL/PLATELET
BASO%: 0.2 % (ref 0.0–2.0)
Basophils Absolute: 0 10*3/uL (ref 0.0–0.1)
EOS%: 1.9 % (ref 0.0–7.0)
Eosinophils Absolute: 0.1 10*3/uL (ref 0.0–0.5)
HCT: 33.8 % — ABNORMAL LOW (ref 34.8–46.6)
HGB: 11.4 g/dL — ABNORMAL LOW (ref 11.6–15.9)
LYMPH%: 16.8 % (ref 14.0–49.7)
MCH: 29.2 pg (ref 25.1–34.0)
MCHC: 33.7 g/dL (ref 31.5–36.0)
MCV: 86.7 fL (ref 79.5–101.0)
MONO#: 0.4 10*3/uL (ref 0.1–0.9)
MONO%: 7.7 % (ref 0.0–14.0)
NEUT#: 3.5 10*3/uL (ref 1.5–6.5)
NEUT%: 73.4 % (ref 38.4–76.8)
Platelets: 197 10*3/uL (ref 145–400)
RBC: 3.9 10*6/uL (ref 3.70–5.45)
RDW: 14.9 % — ABNORMAL HIGH (ref 11.2–14.5)
WBC: 4.8 10*3/uL (ref 3.9–10.3)
lymph#: 0.8 10*3/uL — ABNORMAL LOW (ref 0.9–3.3)

## 2010-10-09 LAB — CMP (CANCER CENTER ONLY)
ALT(SGPT): 19 U/L (ref 10–47)
AST: 20 U/L (ref 11–38)
Albumin: 3.5 g/dL (ref 3.3–5.5)
Alkaline Phosphatase: 84 U/L (ref 26–84)
BUN, Bld: 14 mg/dL (ref 7–22)
CO2: 29 mEq/L (ref 18–33)
Calcium: 8.8 mg/dL (ref 8.0–10.3)
Chloride: 101 mEq/L (ref 98–108)
Creat: 0.7 mg/dl (ref 0.6–1.2)
Glucose, Bld: 102 mg/dL (ref 73–118)
Potassium: 4.1 mEq/L (ref 3.3–4.7)
Sodium: 140 mEq/L (ref 128–145)
Total Bilirubin: 0.5 mg/dl (ref 0.20–1.60)
Total Protein: 7.5 g/dL (ref 6.4–8.1)

## 2010-10-09 LAB — CEA: CEA: <0.5 ng/mL (ref 0.0–5.0)

## 2010-10-14 ENCOUNTER — Encounter: Payer: Self-pay | Admitting: Oncology

## 2010-10-16 ENCOUNTER — Other Ambulatory Visit: Payer: Self-pay | Admitting: Oncology

## 2010-10-16 DIAGNOSIS — C189 Malignant neoplasm of colon, unspecified: Secondary | ICD-10-CM

## 2011-02-05 NOTE — Op Note (Signed)
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   SURGEON:  [name], M.D.   ASSISTANT:  Adolph Pollack, M.D.   PROCEDURE:  A laparoscopic low anterior resection with diverting  ileostomy.   SPECIMENS:  Distal sigmoid proximal rectum.   ESTIMATED BLOOD LOSS:  Approximately 75 ml.   COMPLICATIONS:  None immediate.   A 19 [place] Blake drain was placed in the pelvis.   INDICATIONS FOR PROCEDURE:  Tiffany Walsh is a 55 year old female who was  found to have a polyp in the distal sigmoid proximal rectum.  A biopsy  was consistent with adenocarcinoma.  Due to the location of the lesion,  I discussed with the patient performing a laparoscopic low anterior  resection, and it was discussed that she may have an ostomy temporarily  due to the location of the lesion.  Informed consent was obtained prior  to the procedure.   DETAILS OF THE PROCEDURE:  Tiffany Walsh was identified in the  preoperative holding suite.  She was given heparin subcutaneously as  well as cefoxitin.  She was then taken to the operating room, placed in  a supine position.  After administration of general endotracheal  anesthesia, she was placed in a modified lithotomy position.  All  appropriate pressure points were padded.  Her rectum was irrigated with  saline and a small amount of Betadine.  Her abdomen was then prepped and  draped in the usual sterile fashion.  A time-out procedure indicated the  patient and the procedure were performed.  A supraumbilical incision was  placed.  A Varies needle was used to obtain  pneumoperitoneum.  After  adequate insufflation, a 5 mm trocar was placed at the superior  incision.  Using the OptiView, I placed this trocar in the abdominal  cavity.  Upon inspection, there was no evidence of injury on placement  of the trocar or the Varies needle.  The abdomen was fully inspected.  There were some adhesions noted on the right side of the abdomen.  No  abnormalities were noted.  The liver appeared normal.  A 5 mm port was  then placed in the right mid clavicular line approximately 10 cm lateral  to the midline trocar.  A second port was placed at the suprapubic  region.  A third 5 mm trocar was placed in the right upper quadrant at  the left mid clavicular line.  The sigmoid was identified.  Following  the sigmoid down into the peritoneal reflection, we were able to  identify the blue tattoo mark just at the peritoneal reflection.  I then  chose a point on the distal sigmoid and chose a medial to lateral  approach.  The superior rectal vessels were dissected.  I transected  this with the Ligasure device.  I then proceeded distally along the  sigmoid.  I chose a safe window through the mesentery.  At that time, I  pulled the colon medially and dissected along the line of Toldt.  The  ureter was able to be visualized along the iliac vessels.  This was kept  in view during this dissection.  I proceeded proximally and dissected  the splenic flexure for full laxity of the colon.  There were adhesions  of omentum to the spleen; however, I did not take these fully down, but  I was able to perform the splenic flexure mobilization without  difficulty.  I then turned our attention to the distal sigmoid.  I  scored the peritoneum, the right and left sides of the proximal rectum  with the electrocautery.  The dissection continued circumferentially  around the rectum.  At that time, I chose to extend the suprapubic  incision and placed a hand port.  The Gel port was placed using a  hand  dissection.  I was able to further dissect the mesentery off the distal  semi-proximal rectum with the Ligasure.  I then further carried out the  lateral dissection of the peritoneum.  At that time, we released the  pneumoperitoneum and then continued the dissection under direct  visualization into the pelvis.  The lateral attachments were divided  with the electrocautery with care to avoid injuring the proximal rectum.  The mass was able to be easily palpated, and we did provide  approximately 4 cm distal from this lesion.  I then chose a point with a  reticulating TA60 stapler, stapled across the rectum.  I then chose a  point in the sigmoid which could easily reach down into the pelvis, in  which a blood supply was good.  Using an Allen clamp, I clamped across  the distal sigmoid.  This was then incised with Mayo scissors.  The  specimen was passed off the operating room table.  The pelvic cavity was  irrigated.  There was a mild amount of bleeding on the lateral aspect of  the stapel line which was controlled with a 3-0 Vicryl suture.  With  further irrigation, there was no evidence of bleeding.  I oriented the  distal sigmoid with a mesentery in its anatomical position and placed  the anvil of the EEA stapler and secured this with a 3-0 Prolene suture.  Once the anvil was in place, Dr. Abbey Chatters went below the patient and  advanced the stapler through the patient's rectum.  I was able to  visualize this during all times.  The stapling device was then deployed  just anterior to the staple line.  I placed the anvil securely to the  stapling device; however, at some point, it became loose and the distal  portion of the EEA stapler was removed distally before I noticed this.  We then attempted to redeploy the EEA stapler through the previous  enterotomy; however, at that point, the tension on the suture line was  too great, and this pulled apart.  Dr. Abbey Chatters then removed the  EEA  stapler.  We performed a hand closure interrupted with 3-0 Vicryl  sutures of the defective staple line.  More extensive mobilization of  the anterior and posterior rectum was performed.  Once satisfied with  this closure, once again we placed the EEA staple through the rectum.  The  stapler was again placed through the patien'ts bottom; however, once  again, the staple line appeared to be ripping.  At that time, we placed  the introducer of the stapler through this small hole in the distal  rectum and deployed the stapler through the repair.  Upon firing of the  stapler and inspection of the ring, proximal ring appeared intact but  the distal donut did not appear to be fully intact.  This anastomosis  was fully visualized.  No defects were apparent.  The staple line was  secured with 3-0 Vicryl interrupted suture. Once performing the repair  posteriorly, Dr. Abbey Chatters performed a rigid proctoscopy.  Air was  insufflated through this staple line, and the abdomen was filled to  saline.  There was no bubbling within the pelvic cavity.  The staple  line did appear intact without any defects.  Once satisfied with this  repair, the proctoscope was removed.  I placed a #19 Blake drain into  the pelvis.  Due to the separation of the staple line, I chose to make a  diverting ileostomy in order to let this anastomosis heal without  complications.  The cecum was identified.  The distal ileum followed  approximately 10 cm proximally.  Using a Kocher, an incision was placed.  The anterior rectus fascia was identified.  A cruciate incision was  placed, allowing two fingers to easily fit through the anterior and  posterior fascia, separating the rectus muscle.  A distal ileum was  brought up this defect.  A rent was made in the mesentery, and a #16 red  rubber catheter was placed to secure the ileostomy while closure of the  fascia was performed.  The fascial defect at the suprapubic region was   closed with a running 0 PDS suture.  Once again, the abdomen was  insufflated and inspected the fascial repair.  This did appear intact.  The drain was in the pelvis, and no bleeding was noted upon final  inspection of the abdominal cavity.  The pneumoperitoneum was then  released.  The skin was closed with 4-0 Monocryl in a running fashion.  All port sites were closed with 4-0 Monocryl.  These were then dressed  with dry gauze and a Tegaderm.  The ileostomy was then matured with 4-0 Vicryl.  The red rubber was  secured to the patient's skin as an anchoring device for the ileostomy.  An ostomy appliance was then applied.  The patient was then awakened and  transferred to the post anesthesia care unit in stable condition.      [name], MD  Electronically Signed     ALA/MEDQ  D:  12/01/2007  T:  12/01/2007  Job:  [id number]

## 2011-02-05 NOTE — Discharge Summary (Signed)
Tiffany Walsh, Tiffany Walsh              ACCOUNT NO.:  [id number]   MEDICAL RECORD NO.:  [id number]          PATIENT TYPE:  INP   LOCATION:  1525                         FACILITY:  [facility]   PHYSICIAN:  [name], MD      DATE OF BIRTH:  11-21-1955   DATE OF ADMISSION:  12/01/2007  DATE OF DISCHARGE:  12/05/2007                               DISCHARGE SUMMARY   FINAL DIAGNOSIS:  Adenocarcinoma of the rectum.   PROCEDURE:  Laparoscopic hand-assisted low anterior resection.   POSTOPERATIVE COURSE:  Tiffany Walsh is a 55 year old female who received  a laparoscopic low anterior resection with diverting ostomy on the 10th  of March.  Postoperatively, she began having output out of her ostomy,  was advanced to a regular diet.  Pain was initially controlled with the  PCA and was switched to Percocet orally.  I did start her on Imodium  t.i.d. to aid with thickening of the stool to prevent dehydration.  The  patient was somewhat upset about having the diverting ostomy; however,  was appreciative of her care during the hospital.  She received wound  ostomy teaching prior to her discharge, and was comfortable taking care  of her ostomy.  Her discharge medications include Percocet 5/325,  Imodium 4 mg 3 times a day.  She will come this Monday following her  discharge to have the bridging device removed from her loop ileostomy,  to follow up with me in approximately 2 weeks' time.  The final  pathology was consistent with adenocarcinoma with 3 out of 9 lymph  nodes.  I will make a referral outpatient for medical oncology and have  a discussion with oncologist about possible referral to radiation  oncology.      [name], MD  Electronically Signed     ALA/MEDQ  D:  12/15/2007  T:  12/15/2007  Job:  [id number]   cc:   Everardo All. Madilyn Fireman, M.D.  Fax: [phone]   [name], M.D.  Fax: [phone]   Medical Oncology Department

## 2011-02-05 NOTE — Discharge Summary (Signed)
NAMEJAKYRIA, Tiffany Walsh              ACCOUNT NO.:  [id number]   MEDICAL RECORD NO.:  [id number]          PATIENT TYPE:  INP   LOCATION:  1524                         [facility]ACILITY:  [facility]   PHYSICIAN:  [name], MD      DATE OF BIRTH:  12/11/1955   DATE OF ADMISSION:  04/27/2008  DATE OF DISCHARGE:  04/30/2008                               DISCHARGE SUMMARY   FINAL DIAGNOSIS:  Rectal cancer status post low anterior resection with  diverting ostomy, now for ostomy takedown.   PROCEDURES PERFORMED:  [name] underwent a loop ileostomy takedown  on April 27, 2008.   HOSPITAL COURSE:  She was kept postoperatively for pain control as well  as monitoring for postoperative ileus.  She was able to have advance of  her diet to a regular diet.  She has had pain well controlled with  Percocet.  She has had some difficulty tolerating the swallowing pills,  therefore I will discharge her home on Roxicet.  Her hospital course has  been otherwise unremarkable.  She has had bowel movement today and has  had minimal amount of pain.   DISCHARGE MEDICATIONS:  Roxicet, her home medications of Nexium and  metoprolol.   She will follow up in clinic with a nurse visit Monday for removal of  the 2 wicks in her wound.  I have removed 2 today.  I have instructed  her to place a dry gauze over it, take a shower at home and she will see  me in approximately 2 week's time.      [name], MD  Electronically Signed     ALA/MEDQ  D:  04/30/2008  T:  04/30/2008  Job:  [id number]   cc:   [name], M.D.  Fax: [phone]   Blenda Nicely. Shadad  Fax: [phone]

## 2011-02-05 NOTE — Op Note (Signed)
Tiffany Walsh, Tiffany Walsh              ACCOUNT NO.:  [id number]   MEDICAL RECORD NO.:  [id number]          PATIENT TYPE:  INP   LOCATION:  1524                         [facility]ACILITY:  [facility]   PHYSICIAN:  [name], MD      DATE OF BIRTH:  Feb 08, 1956   DATE OF PROCEDURE:  04/27/2008  DATE OF DISCHARGE:                               OPERATIVE REPORT   PREOPERATIVE DIAGNOSIS:  Sigmoid rectal cancer, status post chemotherapy  and radiation treatment, the diverting ostomy now ready for takedown.   PROCEDURE:  Ostomy takedown.   SURGEON:  Bertram Savin, MD.   ASSISTANT:  None.   ANESTHESIA:  General endotracheal anesthesia.   FINDINGS:  No significant findings.   ESTIMATED BLOOD LOSS:  Approximately 50 mL.   COMPLICATIONS:  No immediate complications.   DRAINS:  No drains were placed.   INDICATIONS FOR PROCEDURE:  [name] is a 55 year old female who had  rectal cancer, had LAR approximately 5-6 months ago.  She had received  chemotherapy and radiation treatment, had a diverting ostomy.  Due to a  question of leakage she did have a preoperative barium enema which  showed no leakage from her anastomosis.  Informed consent was obtained  for the takedown.   DETAILS OF PROCEDURE:  [name] was identified in the preoperative  holding area.  She received subcu heparin and a gram Kefzol, and was  taken to the operating room.  Once in the operating room, placed in a  supine position.  Her abdomen was prepped and draped in the usual  sterile fashion.  A time-out procedure indicating the patient and  procedure were performed.  At the previous ostomy site, I performed an  elliptical incision around the ostomy.  Subcutaneous tissues were  divided with electrocautery.  Using blunt dissection as well as  electrocautery, I dissected around the loop ileostomy after closing this  with a 2-0 silk suture.  I was able to find the rectus fascia and using  blunt dissection electrocautery, I was able  to isolate the small bowel  from the rectus muscle, freeing up the small intestine.  This was  brought out to the operative field.  I then performed a side-by-side  stapled anastomosis using the GIA stapler.  I then stapled the  enterotomy site with another load of the GIA stapler.  There did seem to  be somewhat of an adhesion towards the midline.  I tried to do this with  blunt dissection but did not become too aggressive with my dissection.  The small bowel delivered easily.  Therefore, I just performed a stapled  anastomosis.  The intestine was then delivered back into the abdominal  cavity.  I irrigated the wound bed and closed it with a 1-0 PDS suture.  Once again irrigated the wound bed.  Closed the dermis with a 3-0 Vicryl  suture.  I placed Telfa pad  within the small opening.  Dry gauze was placed for a final dressing.  The patient was extubated and transferred to the postanesthesia care  unit in stable condition.  She will be monitored  for the next couple of  days and transitioned to oral medicines and a regular diet.  Will be  discharged home hopefully in a couple of days.      [name], MD  Electronically Signed     ALA/MEDQ  D:  04/27/2008  T:  04/28/2008  Job:  [id number]   cc:   [name], M.D.  Fax: [phone]   [name], M.D.  Fax: [phone]   Blenda Nicely. Shadad  Fax: [phone]

## 2011-02-05 NOTE — Op Note (Signed)
Tiffany Walsh, Tiffany Walsh              ACCOUNT NO.:  [id number]   MEDICAL RECORD NO.:  [id number]          PATIENT TYPE:  AMB   LOCATION:  ENDO                         FACILITY:  [facility]   PHYSICIAN:  [name], M.D.    DATE OF BIRTH:  07-Dec-1955   DATE OF PROCEDURE:  10/20/2007  DATE OF DISCHARGE:                               OPERATIVE REPORT   PROCEDURE:  Colonoscopy with polypectomy and biopsy.   INDICATION FOR PROCEDURE:  Previous colonoscopy one month ago showing an  ill-defined friable lesion at the rectosigmoid junction with biopsy  showing high grade dysplasia or adenocarcinoma in situ.  Aso attempted  cautery was done at that time with uncertainty as to whether the lesion  was fulgurated.  Procedure is to follow-up lesion and attempt complete  fulguration if feasible.   PROCEDURE:  The patient was placed in the left lateral decubitus  position and placed on the pulse monitor with continuous low-flow oxygen  delivered by nasal cannula.  She was sedated 75 mcg IV fentanyl and 6 mg  IV Versed.  The Olympus video colonoscope was inserted into the rectum  and advanced to the cecum, confirmed by transillumination of McBurney's  point and visualization of ileocecal valve and appendiceal orifice.  Prep was excellent.  The cecum, ascending, transverse and descending  colon all appeared normal with no masses, polyps, diverticula or other  mucosal abnormalities.  Within the sigmoid colon there was a small  translucent sessile 6 mm polyp that was fulgurated by hot biopsy.  At  the first bend in the rectum and from approximately 12-14 cm there was  previously seen vaguely delineated friable irregular small nearly flat  mass without much elevation from the surrounding mucosa.  After  prolonged inspection of this lesion I felt by appearance, it likely  harbored unfavorable histology and was of unfavorable configuration to  predict likely definitive endoscopic removal.  I therefore  decided  simply take cold biopsies and we will plan on a surgical referral.  No  other lesions were seen distal to 12 cm.  Retroflexed view of the anus  revealed no obviously enlarged internal hemorrhoids.  The scope was then  withdrawn and the patient returned to the recovery room in stable  condition.  She tolerated the procedure well.  There were no immediate  complications.   IMPRESSION:  1. Worrisome appearing small rectal flat mass with previously      determined high-grade dysplasia or carcinoma in situ.   PLAN:  Await biopsies but in any case will refer for surgical treatment           ______________________________  Everardo All. Madilyn Fireman, M.D.     JCH/MEDQ  D:  10/20/2007  T:  10/20/2007  Job:  [id number]   cc:   [name], M.D.  Fax: [phone]

## 2011-02-08 NOTE — Op Note (Signed)
Tiffany Walsh, PILLING              ACCOUNT NO.:  [id number]   MEDICAL RECORD NO.:  [id number]          PATIENT TYPE:  AMB   LOCATION:  ENDO                         FACILITY:  [facility]   PHYSICIAN:  [name], M.D.    DATE OF BIRTH:  Oct 25, 1955   DATE OF PROCEDURE:  09/08/2006  DATE OF DISCHARGE:                               OPERATIVE REPORT   PROCEDURE:  Flexible sigmoidoscopy with polypectomy.   INDICATIONS FOR PROCEDURE:  History of multiple colon polyps on initial  colonoscopy earlier this year with one sessile rectal polyp that it was  felt might not have been completely removed.  Procedure is to reassess  this area and consider laser therapy is needed.   PROCEDURE:  The patient was placed in the left lateral decubitus  position and placed on the pulse monitor with continuous low flow oxygen  delivered by nasal cannula.  She was sedated with 50 mcg IV fentanyl and  5 mg IV Versed.  Olympus video colonoscope was inserted into the rectum  and advanced to approximately 30 cm.  The prep was good.  The sigmoid  colon appeared normal.  Within the rectum at approximately 15 cm from  the anal verge, there was a 6 mm polyp that was removed by snare.  It  was not clear whether this represented the polyp previously removed.  Slightly more distal there was a tiny polyp adjacent to a white area of  apparent scar tissue that looked to be from possible previous  polypectomy.  This was also fulgurated with a snare.  On careful  inspection, no other lesions were seen in the rectum or rectosigmoid.  The scope was then withdrawn and the patient returned to the recovery  room in stable condition.  She tolerated the procedure well and there  were no immediate complications.   IMPRESSION:  Two relatively small polyps in the rectum; identification  of previous piecemeal removed polyp not completely certain but probably  representing one of the two polyps seen today.   PLAN:  Await histology and  we will probably repeat colonoscopy in 1-2  years.           ______________________________  Everardo All Madilyn Fireman, M.D.     JCH/MEDQ  D:  09/08/2006  T:  09/09/2006  Job:  [id number]   cc:   [name], M.D.

## 2011-04-09 ENCOUNTER — Other Ambulatory Visit: Payer: Self-pay | Admitting: Oncology

## 2011-04-09 ENCOUNTER — Ambulatory Visit (HOSPITAL_COMMUNITY)
Admission: RE | Admit: 2011-04-09 | Discharge: 2011-04-09 | Disposition: A | Discharge status: Home / Self Care | Payer: BC Managed Care – PPO | Source: Ambulatory Visit | Attending: Oncology | Admitting: Oncology

## 2011-04-09 ENCOUNTER — Encounter (HOSPITAL_BASED_OUTPATIENT_CLINIC_OR_DEPARTMENT_OTHER): Payer: BC Managed Care – PPO | Admitting: Oncology

## 2011-04-09 DIAGNOSIS — C19 Malignant neoplasm of rectosigmoid junction: Secondary | ICD-10-CM

## 2011-04-09 DIAGNOSIS — Z9221 Personal history of antineoplastic chemotherapy: Secondary | ICD-10-CM | POA: Insufficient documentation

## 2011-04-09 DIAGNOSIS — I2510 Atherosclerotic heart disease of native coronary artery without angina pectoris: Secondary | ICD-10-CM | POA: Insufficient documentation

## 2011-04-09 DIAGNOSIS — C189 Malignant neoplasm of colon, unspecified: Secondary | ICD-10-CM | POA: Insufficient documentation

## 2011-04-09 DIAGNOSIS — K7689 Other specified diseases of liver: Secondary | ICD-10-CM | POA: Insufficient documentation

## 2011-04-09 DIAGNOSIS — Z923 Personal history of irradiation: Secondary | ICD-10-CM | POA: Insufficient documentation

## 2011-04-09 LAB — CMP (CANCER CENTER ONLY)
ALT(SGPT): 18 U/L (ref 10–47)
AST: 21 U/L (ref 11–38)
Alkaline Phosphatase: 84 U/L (ref 26–84)
Sodium: 142 mEq/L (ref 128–145)
Total Bilirubin: 0.5 mg/dl (ref 0.20–1.60)
Total Protein: 7.7 g/dL (ref 6.4–8.1)

## 2011-04-09 LAB — CBC WITH DIFFERENTIAL/PLATELET
BASO%: 0.3 % (ref 0.0–2.0)
LYMPH%: 17.1 % (ref 14.0–49.7)
MCHC: 33.3 g/dL (ref 31.5–36.0)
MCV: 87.2 fL (ref 79.5–101.0)
MONO%: 11 % (ref 0.0–14.0)
Platelets: 208 10*3/uL (ref 145–400)
RBC: 3.98 10*6/uL (ref 3.70–5.45)
RDW: 15 % — ABNORMAL HIGH (ref 11.2–14.5)
WBC: 5.7 10*3/uL (ref 3.9–10.3)

## 2011-04-09 MED ORDER — IOHEXOL 300 MG/ML  SOLN
100.0000 mL | Freq: Once | INTRAMUSCULAR | Status: AC | PRN
  Administered 2011-04-09: 100 mL via INTRAVENOUS

## 2011-04-16 ENCOUNTER — Encounter (HOSPITAL_BASED_OUTPATIENT_CLINIC_OR_DEPARTMENT_OTHER): Payer: BC Managed Care – PPO | Admitting: Oncology

## 2011-04-16 ENCOUNTER — Other Ambulatory Visit: Payer: Self-pay | Admitting: Oncology

## 2011-04-16 DIAGNOSIS — C19 Malignant neoplasm of rectosigmoid junction: Secondary | ICD-10-CM

## 2011-04-16 DIAGNOSIS — C189 Malignant neoplasm of colon, unspecified: Secondary | ICD-10-CM

## 2011-06-17 LAB — CBC
HCT: 26.3 — ABNORMAL LOW
HCT: 27.4 — ABNORMAL LOW
Hemoglobin: 11.9 — ABNORMAL LOW
Hemoglobin: 9.1 — ABNORMAL LOW
MCHC: 34.6
MCHC: 34.8
MCV: 84.4
MCV: 85
Platelets: 179
RBC: 3.23 — ABNORMAL LOW
RDW: 14
RDW: 14.3

## 2011-06-17 LAB — BASIC METABOLIC PANEL
CO2: 28
Calcium: 9.4
Creatinine, Ser: 0.69
Glucose, Bld: 112 — ABNORMAL HIGH
Sodium: 140

## 2011-06-17 LAB — COMPREHENSIVE METABOLIC PANEL
ALT: 11
AST: 19
BUN: 3 — ABNORMAL LOW
CO2: 25
Calcium: 7.8 — ABNORMAL LOW
Calcium: 8.6
Creatinine, Ser: 0.69
Creatinine, Ser: 0.73
GFR calc Af Amer: >60
GFR calc Af Amer: >60
GFR calc non Af Amer: >60
Glucose, Bld: 124 — ABNORMAL HIGH
Sodium: 140
Total Protein: 5.9 — ABNORMAL LOW

## 2011-06-17 LAB — PHOSPHORUS: Phosphorus: 2.6

## 2011-06-17 LAB — MAGNESIUM: Magnesium: 1.5

## 2011-06-17 LAB — TYPE AND SCREEN: Antibody Screen: NEGATIVE

## 2011-06-21 LAB — CBC
HCT: 32.7 — ABNORMAL LOW
MCHC: 33.8
Platelets: 178
RDW: 21.8 — ABNORMAL HIGH

## 2011-06-21 LAB — DIFFERENTIAL
Basophils Relative: 0
Eosinophils Absolute: 0
Eosinophils Relative: 1
Lymphocytes Relative: 15
Neutro Abs: 2.2
Neutrophils Relative %: 73

## 2011-06-21 LAB — COMPREHENSIVE METABOLIC PANEL
Albumin: 3.7
Alkaline Phosphatase: 70
BUN: 12
Calcium: 9
Glucose, Bld: 98
Potassium: 3.8
Sodium: 138
Total Protein: 6.9

## 2011-07-09 ENCOUNTER — Other Ambulatory Visit: Payer: Self-pay | Admitting: Family Medicine

## 2011-07-09 DIAGNOSIS — M25569 Pain in unspecified knee: Secondary | ICD-10-CM

## 2011-07-16 ENCOUNTER — Ambulatory Visit
Admission: RE | Admit: 2011-07-16 | Discharge: 2011-07-16 | Disposition: A | Discharge status: Home / Self Care | Payer: BC Managed Care – PPO | Source: Ambulatory Visit | Attending: Family Medicine | Admitting: Family Medicine

## 2011-07-16 DIAGNOSIS — M25569 Pain in unspecified knee: Secondary | ICD-10-CM

## 2011-09-20 ENCOUNTER — Telehealth: Payer: Self-pay | Admitting: Oncology

## 2011-09-20 NOTE — Telephone Encounter (Signed)
l/m with appt info for 1/29 and 2/6.  also info for ct sent     aom °

## 2011-10-15 ENCOUNTER — Other Ambulatory Visit (HOSPITAL_COMMUNITY): Payer: BC Managed Care – PPO

## 2011-10-21 ENCOUNTER — Encounter: Payer: Self-pay | Admitting: *Deleted

## 2011-10-22 ENCOUNTER — Ambulatory Visit (HOSPITAL_COMMUNITY)
Admission: RE | Admit: 2011-10-22 | Discharge: 2011-10-22 | Disposition: A | Discharge status: Home / Self Care | Payer: BC Managed Care – PPO | Source: Ambulatory Visit | Attending: Oncology | Admitting: Oncology

## 2011-10-22 ENCOUNTER — Other Ambulatory Visit (HOSPITAL_BASED_OUTPATIENT_CLINIC_OR_DEPARTMENT_OTHER): Payer: BC Managed Care – PPO | Admitting: Lab

## 2011-10-22 DIAGNOSIS — Z9049 Acquired absence of other specified parts of digestive tract: Secondary | ICD-10-CM | POA: Insufficient documentation

## 2011-10-22 DIAGNOSIS — Z923 Personal history of irradiation: Secondary | ICD-10-CM | POA: Insufficient documentation

## 2011-10-22 DIAGNOSIS — Z9221 Personal history of antineoplastic chemotherapy: Secondary | ICD-10-CM | POA: Insufficient documentation

## 2011-10-22 DIAGNOSIS — J984 Other disorders of lung: Secondary | ICD-10-CM | POA: Insufficient documentation

## 2011-10-22 DIAGNOSIS — I10 Essential (primary) hypertension: Secondary | ICD-10-CM

## 2011-10-22 DIAGNOSIS — Z23 Encounter for immunization: Secondary | ICD-10-CM

## 2011-10-22 DIAGNOSIS — C189 Malignant neoplasm of colon, unspecified: Secondary | ICD-10-CM

## 2011-10-22 DIAGNOSIS — I2510 Atherosclerotic heart disease of native coronary artery without angina pectoris: Secondary | ICD-10-CM | POA: Insufficient documentation

## 2011-10-22 DIAGNOSIS — K7689 Other specified diseases of liver: Secondary | ICD-10-CM | POA: Insufficient documentation

## 2011-10-22 DIAGNOSIS — G609 Hereditary and idiopathic neuropathy, unspecified: Secondary | ICD-10-CM

## 2011-10-22 DIAGNOSIS — N289 Disorder of kidney and ureter, unspecified: Secondary | ICD-10-CM | POA: Insufficient documentation

## 2011-10-22 DIAGNOSIS — C19 Malignant neoplasm of rectosigmoid junction: Secondary | ICD-10-CM

## 2011-10-22 DIAGNOSIS — Z85038 Personal history of other malignant neoplasm of large intestine: Secondary | ICD-10-CM | POA: Insufficient documentation

## 2011-10-22 LAB — CBC WITH DIFFERENTIAL/PLATELET
Basophils Absolute: 0 10*3/uL (ref 0.0–0.1)
Eosinophils Absolute: 0.1 10*3/uL (ref 0.0–0.5)
HCT: 34.7 % — ABNORMAL LOW (ref 34.8–46.6)
HGB: 11.6 g/dL (ref 11.6–15.9)
LYMPH%: 23 % (ref 14.0–49.7)
MCV: 87.9 fL (ref 79.5–101.0)
MONO#: 0.3 10*3/uL (ref 0.1–0.9)
MONO%: 6.7 % (ref 0.0–14.0)
NEUT#: 3.2 10*3/uL (ref 1.5–6.5)
NEUT%: 68.6 % (ref 38.4–76.8)
Platelets: 228 10*3/uL (ref 145–400)
WBC: 4.6 10*3/uL (ref 3.9–10.3)

## 2011-10-22 LAB — CMP (CANCER CENTER ONLY)
Alkaline Phosphatase: 94 U/L — ABNORMAL HIGH (ref 26–84)
BUN, Bld: 11 mg/dL (ref 7–22)
CO2: 28 mEq/L (ref 18–33)
Glucose, Bld: 96 mg/dL (ref 73–118)
Sodium: 144 mEq/L (ref 128–145)
Total Bilirubin: 0.5 mg/dl (ref 0.20–1.60)
Total Protein: 7.6 g/dL (ref 6.4–8.1)

## 2011-10-22 LAB — CEA: CEA: <0.5 ng/mL (ref 0.0–5.0)

## 2011-10-22 MED ORDER — IOHEXOL 300 MG/ML  SOLN
100.0000 mL | Freq: Once | INTRAMUSCULAR | Status: AC | PRN
  Administered 2011-10-22: 100 mL via INTRAVENOUS

## 2011-10-30 ENCOUNTER — Ambulatory Visit (HOSPITAL_BASED_OUTPATIENT_CLINIC_OR_DEPARTMENT_OTHER): Payer: BC Managed Care – PPO | Admitting: Oncology

## 2011-10-30 ENCOUNTER — Telehealth: Payer: Self-pay | Admitting: Oncology

## 2011-10-30 VITALS — BP 123/82 | HR 65 | Temp 97.3°F | Ht 65.5 in | Wt 130.1 lb

## 2011-10-30 DIAGNOSIS — C189 Malignant neoplasm of colon, unspecified: Secondary | ICD-10-CM

## 2011-10-30 DIAGNOSIS — I10 Essential (primary) hypertension: Secondary | ICD-10-CM

## 2011-10-30 NOTE — Progress Notes (Signed)
Hematology and Oncology Follow Up Visit  Tiffany Walsh [id number] Jun 01, 1956 56 y.o. 10/30/2011 4:04 PM  CC: Tiffany Muckle, MD  Tiffany Walsh, M.D.  Tiffany Walsh, M.D.  Tiffany Walsh, M.D.    Principle Diagnosis: :  A 56 year old female diagnosed with a T3 N1, moderately differentiated adenocarcinoma of the upper rectum diagnosed in March 2009.   Prior Therapy: 1. Status post laparoscopic low anterior resection.  Tumor was T3 N1 with 3 out of 19 lymph nodes involved.  Operation was done in March 2009.  2. The patient received radiation therapy concomitantly with Xeloda.  Therapy concluded on February 22, 2008.  3. She is status post ostomy takedown in August 2009.  4. She received 6 months of therapy adjuvantly, receiving oxaliplatin and Xeloda therapy, concluded in December 2009.  Current therapy: Observation and surveillance  Interim History:  Tiffany Walsh presents today for a followup visit.  A very pleasant 56 year old with the above history.  She has continued to have no evidence of any recurrent disease.  She is close to 4 years out from her operation and is 3.5 years out from completion of her systemic chemotherapy.  She is doing very good at this point and really not reporting any major health concerns.  She is not reporting any nausea or vomiting.  Had not reported any abdominal distention.  She does report some occasional diarrhea.  Had not reported any recurrent neurological symptoms or residual neuropathy.  She has very faint sensory neuropathy in her lower extremity, but had not really changed or gotten worse by any stretch.  She has continued to work full-time and have excellent quality of life and performance status.  Medications: I have reviewed the patient's current medications. Current outpatient prescriptions:esomeprazole (NEXIUM) 20 MG capsule, Take 40 mg by mouth daily before breakfast. , Disp: , Rfl: ;  metoprolol succinate (TOPROL-XL) 100 MG 24 hr tablet, Take 100 mg by  mouth daily. Take with or immediately following a meal., Disp: , Rfl: ;  naproxen sodium (ANAPROX) 220 MG tablet, Take 220 mg by mouth as needed., Disp: , Rfl: ;  verapamil (CALAN) 120 MG tablet, Take 120 mg by mouth daily., Disp: , Rfl:   Allergies: Allergies no known allergies  Past Medical History, Surgical history, Social history, and Family History were reviewed and updated.  Review of Systems: Constitutional:  Negative for fever, chills, night sweats, anorexia, weight loss, pain. Cardiovascular: no chest pain or dyspnea on exertion Respiratory: no cough, shortness of breath, or wheezing Neurological: no TIA or stroke symptoms Dermatological: negative ENT: negative Skin: Negative. Gastrointestinal: no abdominal pain, change in bowel habits, or black or bloody stools Genito-Urinary: no dysuria, trouble voiding, or hematuria Hematological and Lymphatic: negative Breast: negative Musculoskeletal: negative Remaining ROS negative. Physical Exam: Blood pressure 123/82, pulse 65, temperature 97.3 F (36.3 C), temperature source Oral, height 5' 5.5" (1.664 m), weight 130 lb 1.6 oz (59.013 kg). ECOG: 0 General appearance: alert Head: Normocephalic, without obvious abnormality, atraumatic Neck: no adenopathy, no carotid bruit, no JVD, supple, symmetrical, trachea midline and thyroid not enlarged, symmetric, no tenderness/mass/nodules Lymph nodes: Cervical, supraclavicular, and axillary nodes normal. Heart:regular rate and rhythm, S1, S2 normal, no murmur, click, rub or gallop Lung:chest clear, no wheezing, rales, normal symmetric air entry Abdomin: soft, non-tender, without masses or organomegaly EXT:no erythema, induration, or nodules   Lab Results: Lab Results  Component Value Date   WBC 4.6 10/22/2011   HGB 11.6 10/22/2011   HCT 34.7*  10/22/2011   MCV 87.9 10/22/2011   PLT 228 10/22/2011     Chemistry      Component Value Date/Time   NA 144 10/22/2011 0818   NA 140 07/17/2010  0949   K 3.9 10/22/2011 0818   K 4.0 07/17/2010 0949   CL 105 10/22/2011 0818   CL 106 07/17/2010 0949   CO2 28 10/22/2011 0818   CO2 23 07/17/2010 0949   BUN 11 10/22/2011 0818   BUN 14 07/17/2010 0949   CREATININE 0.7 10/22/2011 0818   CREATININE 0.69 07/17/2010 0949      Component Value Date/Time   CALCIUM 8.7 10/22/2011 0818   CALCIUM 9.0 07/17/2010 0949   ALKPHOS 94* 10/22/2011 0818   ALKPHOS 77 07/17/2010 0949   AST 23 10/22/2011 0818   AST 19 07/17/2010 0949   ALT 13 07/17/2010 0949   BILITOT 0.50 10/22/2011 0818   BILITOT 0.4 07/17/2010 0949     CT CHEST, ABDOMEN AND PELVIS WITH CONTRAST  Technique: Multidetector CT imaging of the chest, abdomen and  pelvis was performed following the standard protocol during bolus  administration of intravenous contrast.  Contrast: OMNIPAQUE IOHEXOL 300 MG/ML IV SOLN  Comparison: Prior examination 04/09/2011 and 10/09/2010.  CT CHEST  Findings: The mediastinum appears stable. There are no enlarged  mediastinal or hilar lymph nodes. There is no pleural or  pericardial effusion. Coronary artery calcifications are again  noted.  Linear scarring at the left lung base is stable. The lungs are  otherwise clear. There are no suspicious osseous lesions.  IMPRESSION:  Stable chest CT. No evidence of metastatic disease.  CT ABDOMEN AND PELVIS  Findings: Dominant cystic lesion within the right hepatic lobe  demonstrates continued slow growth, measuring 3.7 x 3.2 image 47  (previously 3.4 x 3.3 cm). This demonstrates minimal thin  septations and no solid components. Additional tiny low density  liver lesions are stable. A small focus of enhancement in the  caudate lobe on image 48 is unchanged. Vague area of previously  demonstrated hyperenhancement in the left lobe is faintly  visualized on image 61, not significantly changed.  The gallbladder, biliary system and pancreas appear normal. The  spleen and adrenal glands appear normal. Small  low-density renal  lesions are unchanged.  There is apparent chronic proximal gastric wall thickening, similar  to prior studies and possibly in part due to incomplete distension.  The rectosigmoid anastomosis has a stable appearance. There is  stable stranding in the perirectal and presacral fat. The uterus  and ovaries appear normal. There are no enlarged abdominal pelvic  lymph nodes. The appendix appears normal.  There are no suspicious osseous findings. Lower lumbar spine facet  degenerative changes are associated with a stable anterolisthesis  at L4-L5.  IMPRESSION:  1. Stable postsurgical changes in the pelvis with perirectal and  presacral fibrosis. No local recurrence identified.  2. No evidence of metastatic disease.  3. Slow growth of minimally complex cystic lesion in the right  hepatic lobe. Despite the growth, this is likely a benign finding.  4. Chronic proximal gastric wall thickening may in part be due to  under distension. Chronic gastritis is a possibility. The lack of  significant change would argue against an infiltrative neoplastic  process. Correlate clinically.      Impression and Plan:  This is a pleasant 56 year old female with the following issues:  1. Stage III colorectal cancer.  She had disease diagnosed close to years out now.  She is  status post surgical resection and received adjuvant radiation therapy due to her disease being slightly under the peritoneal reflection, followed by 6 months of chemotherapy and currently really no evidence to suggest recurrent disease by her recent scan and blood testing..  The plan is to continue with active surveillance followup every 6 months and imaging study is every 12 months for the time being.   2. Screening colonoscopy will be due in 2013.  3. Hypertension is under excellent control.  Followup will be in 6 months' time.  [facility], MD 2/6/20134:04 PM

## 2011-10-30 NOTE — Telephone Encounter (Signed)
8/6 appt made and printed for pt  aom

## 2012-01-20 ENCOUNTER — Other Ambulatory Visit: Payer: Self-pay | Admitting: Gastroenterology

## 2012-03-19 ENCOUNTER — Telehealth: Payer: Self-pay | Admitting: Oncology

## 2012-03-19 NOTE — Telephone Encounter (Signed)
lmonvm for pt re appt for 8/7. Moved appt forom 8/6 to 8/7 due to call.

## 2012-03-23 ENCOUNTER — Telehealth: Payer: Self-pay | Admitting: Oncology

## 2012-03-23 NOTE — Telephone Encounter (Signed)
pt called to r/s 8/7,needs a tuesday   aom

## 2012-04-28 ENCOUNTER — Other Ambulatory Visit: Payer: BC Managed Care – PPO | Admitting: Lab

## 2012-04-28 ENCOUNTER — Ambulatory Visit: Payer: BC Managed Care – PPO | Admitting: Oncology

## 2012-04-29 ENCOUNTER — Ambulatory Visit: Payer: BC Managed Care – PPO | Admitting: Oncology

## 2012-04-29 ENCOUNTER — Other Ambulatory Visit: Payer: BC Managed Care – PPO | Admitting: Lab

## 2012-05-05 ENCOUNTER — Ambulatory Visit (HOSPITAL_BASED_OUTPATIENT_CLINIC_OR_DEPARTMENT_OTHER): Payer: BC Managed Care – PPO | Admitting: Oncology

## 2012-05-05 ENCOUNTER — Other Ambulatory Visit (HOSPITAL_BASED_OUTPATIENT_CLINIC_OR_DEPARTMENT_OTHER): Payer: BC Managed Care – PPO | Admitting: Lab

## 2012-05-05 ENCOUNTER — Telehealth: Payer: Self-pay | Admitting: Oncology

## 2012-05-05 VITALS — BP 152/87 | HR 47 | Temp 97.3°F | Resp 20 | Ht 65.5 in | Wt 127.9 lb

## 2012-05-05 DIAGNOSIS — C19 Malignant neoplasm of rectosigmoid junction: Secondary | ICD-10-CM

## 2012-05-05 DIAGNOSIS — C189 Malignant neoplasm of colon, unspecified: Secondary | ICD-10-CM

## 2012-05-05 DIAGNOSIS — I10 Essential (primary) hypertension: Secondary | ICD-10-CM

## 2012-05-05 LAB — CBC WITH DIFFERENTIAL/PLATELET
Basophils Absolute: 0 10*3/uL (ref 0.0–0.1)
Eosinophils Absolute: 0.1 10*3/uL (ref 0.0–0.5)
HGB: 12.1 g/dL (ref 11.6–15.9)
MCV: 87.8 fL (ref 79.5–101.0)
MONO#: 0.4 10*3/uL (ref 0.1–0.9)
MONO%: 8 % (ref 0.0–14.0)
NEUT#: 3.4 10*3/uL (ref 1.5–6.5)
RDW: 14.3 % (ref 11.2–14.5)
WBC: 5.1 10*3/uL (ref 3.9–10.3)
lymph#: 1.2 10*3/uL (ref 0.9–3.3)

## 2012-05-05 LAB — CEA: CEA: <0.5 ng/mL (ref 0.0–5.0)

## 2012-05-05 LAB — COMPREHENSIVE METABOLIC PANEL
Albumin: 4.2 g/dL (ref 3.5–5.2)
BUN: 13 mg/dL (ref 6–23)
CO2: 27 mEq/L (ref 19–32)
Calcium: 9.1 mg/dL (ref 8.4–10.5)
Chloride: 105 mEq/L (ref 96–112)
Glucose, Bld: 87 mg/dL (ref 70–99)
Potassium: 3.9 mEq/L (ref 3.5–5.3)
Total Protein: 7.6 g/dL (ref 6.0–8.3)

## 2012-05-05 NOTE — Progress Notes (Signed)
Hematology and Oncology Follow Up Visit  Tiffany Walsh [id number] [birth year] 56 y.o. 05/05/2012 11:44 AM  CC: Tiffany Muckle, MD  L. Tiffany Walsh, M.D.  Tiffany Walsh, M.D.  Tiffany Walsh, M.D.    Principle Diagnosis:  A 57 year old female diagnosed with a T3 N1, moderately differentiated adenocarcinoma of the upper rectum diagnosed in March 2009.  Prior Therapy: 1. Status post laparoscopic low anterior resection.  Tumor was T3 N1 with 3 out of 19 lymph nodes involved.  Operation was done in March 2009.  2. The patient received radiation therapy concomitantly with Xeloda.  Therapy concluded on February 22, 2008.  3. She is status post ostomy takedown in August 2009.  4. She received 6 months of therapy adjuvantly, receiving oxaliplatin and Xeloda therapy, concluded in December 2009.  Current therapy: Observation and surveillance  Interim History:  [name] presents today for a followup visit.  A very pleasant 56 year old with the above history.  She has continued to have no evidence of any recurrent disease.  She is over 4 years out from her operation and from completion of her systemic chemotherapy.  She is doing very good at this point and really not reporting any major health concerns.  She is not reporting any nausea or vomiting.  Had not reported any abdominal distention.  She does report some occasional diarrhea.  Had not reported any recurrent neurological symptoms or residual neuropathy.  She has very faint sensory neuropathy in her lower extremity, but had not really changed or gotten worse by any stretch.  She has continued to work full-time and have excellent quality of life and performance status. She had a colonoscopy this year with out evidence of disease at this time.   Medications: I have reviewed the patient's current medications. Current outpatient prescriptions:esomeprazole (NEXIUM) 20 MG capsule, Take 40 mg by mouth daily before breakfast. , Disp: , Rfl: ;  metoprolol  succinate (TOPROL-XL) 100 MG 24 hr tablet, Take 100 mg by mouth daily. Take with or immediately following a meal., Disp: , Rfl: ;  naproxen sodium (ANAPROX) 220 MG tablet, Take 220 mg by mouth as needed., Disp: , Rfl: ;  verapamil (CALAN) 120 MG tablet, Take 120 mg by mouth daily., Disp: , Rfl:   Allergies: Allergies no known allergies  Past Medical History, Surgical history, Social history, and Family History were reviewed and updated.  Review of Systems: Constitutional:  Negative for fever, chills, night sweats, anorexia, weight loss, pain. Cardiovascular: no chest pain or dyspnea on exertion Respiratory: no cough, shortness of breath, or wheezing Neurological: no TIA or stroke symptoms Dermatological: negative ENT: negative Skin: Negative. Gastrointestinal: no abdominal pain, change in bowel habits, or black or bloody stools Genito-Urinary: no dysuria, trouble voiding, or hematuria Hematological and Lymphatic: negative Breast: negative Musculoskeletal: negative Remaining ROS negative. Physical Exam: Blood pressure 152/87, pulse 47, temperature 97.3 F (36.3 C), temperature source Oral, resp. rate 20, height 5' 5.5" (1.664 m), weight 127 lb 14.4 oz (58.015 kg). ECOG: 0 General appearance: alert Head: Normocephalic, without obvious abnormality, atraumatic Neck: no adenopathy, no carotid bruit, no JVD, supple, symmetrical, trachea midline and thyroid not enlarged, symmetric, no tenderness/mass/nodules Lymph nodes: Cervical, supraclavicular, and axillary nodes normal. Heart:regular rate and rhythm, S1, S2 normal, no murmur, click, rub or gallop Lung:chest clear, no wheezing, rales, normal symmetric air entry Abdomin: soft, non-tender, without masses or organomegaly EXT:no erythema, induration, or nodules   Lab Results: Lab Results  Component Value Date   WBC  5.1 05/05/2012   HGB 12.1 05/05/2012   HCT 36.8 05/05/2012   MCV 87.8 05/05/2012   PLT 188 05/05/2012     Chemistry        Component Value Date/Time   NA 144 10/22/2011 0818   NA 140 07/17/2010 0949   K 3.9 10/22/2011 0818   K 4.0 07/17/2010 0949   CL 105 10/22/2011 0818   CL 106 07/17/2010 0949   CO2 28 10/22/2011 0818   CO2 23 07/17/2010 0949   BUN 11 10/22/2011 0818   BUN 14 07/17/2010 0949   CREATININE 0.7 10/22/2011 0818   CREATININE 0.69 07/17/2010 0949      Component Value Date/Time   CALCIUM 8.7 10/22/2011 0818   CALCIUM 9.0 07/17/2010 0949   ALKPHOS 94* 10/22/2011 0818   ALKPHOS 77 07/17/2010 0949   AST 23 10/22/2011 0818   AST 19 07/17/2010 0949   ALT 13 07/17/2010 0949   BILITOT 0.50 10/22/2011 0818   BILITOT 0.4 07/17/2010 0949      Impression and Plan:  This is a pleasant 56 year old female with the following issues:  1. Stage III colorectal cancer.  She remains disease free 4 years out now.  She is status post surgical resection and received adjuvant radiation therapy due to her disease being slightly under the peritoneal reflection, followed by 6 months of chemotherapy and currently really no evidence to suggest recurrent disease by her recent scan and blood testing or colonoscopy. The plan is to continue with active surveillance followup every 6 months and imaging study is every 12 months. She will be doe for that in 10/2012. 2. Screening colonoscopy will be due in 5 years.  3. Hypertension is under excellent control.  Followup will be in 6 months' time with a CT scan.   [facility], MD 8/13/201311:44 AM

## 2012-05-05 NOTE — Telephone Encounter (Signed)
Gave pt appt calendar for February 2014 lab lab and CT, gave pt oral contrast and NPO 4 hours prior to CT. Pt will see MD after CT

## 2012-07-27 ENCOUNTER — Other Ambulatory Visit (HOSPITAL_COMMUNITY)
Admission: RE | Admit: 2012-07-27 | Discharge: 2012-07-27 | Disposition: A | Discharge status: Home / Self Care | Payer: BC Managed Care – PPO | Source: Ambulatory Visit | Attending: Family Medicine | Admitting: Family Medicine

## 2012-07-27 ENCOUNTER — Other Ambulatory Visit: Payer: Self-pay | Admitting: Family Medicine

## 2012-07-27 DIAGNOSIS — Z124 Encounter for screening for malignant neoplasm of cervix: Secondary | ICD-10-CM | POA: Insufficient documentation

## 2012-10-18 IMAGING — CT CT CHEST W/ CM
1 of 3 series · 14 of 31 positions shown, 18 images · IV contrast (omnipaque)
Comparison: CT 04/10/2010

CT CHEST

CLINICAL DATA: Colon cancer with chemotherapy and radiation
therapy completed 5447.

CT CHEST, ABDOMEN AND PELVIS WITH CONTRAST
TECHNIQUE: Multidetector CT imaging of the chest, abdomen and
pelvis was performed following the standard protocol during bolus
administration of intravenous contrast.
Contrast: 100 ml Omnipaque 300

[Series 2: cap with st · axial · 0.74mm/px · z∈[-595,-70]mm · 14 of 123 slices shown, 18 images]
[im 9/123  mediastinal]
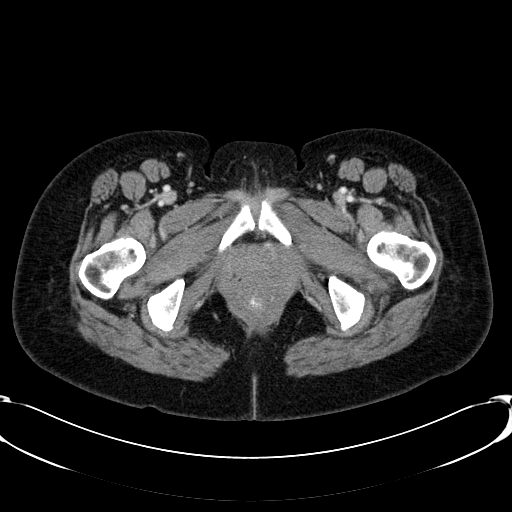
[im 9/123  lung]
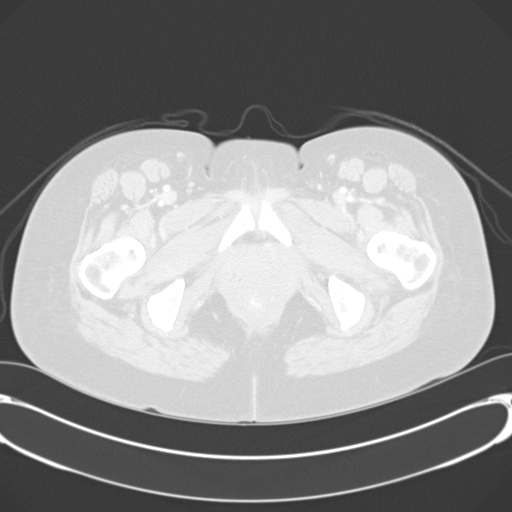
[im 17/123  lung]
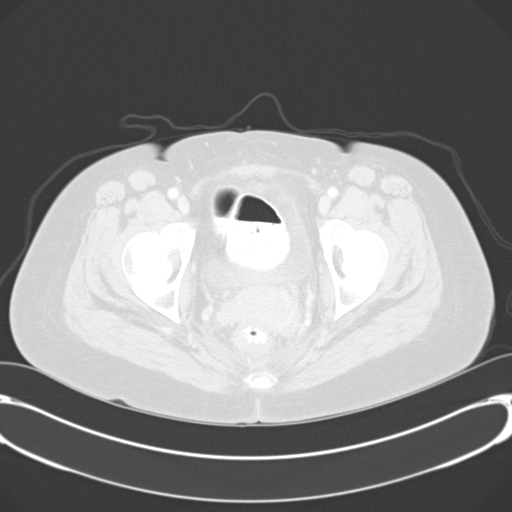
[im 25/123  lung]
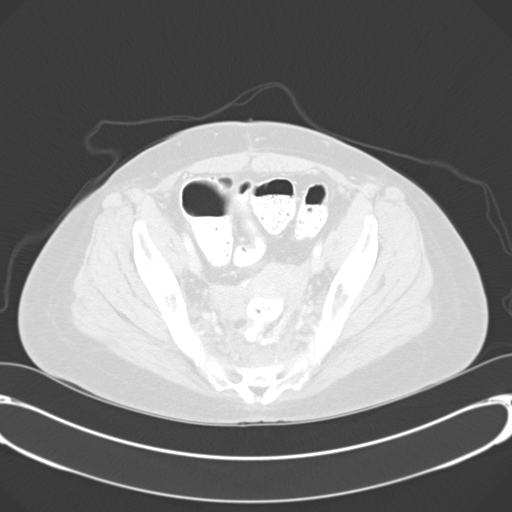
[im 41/123  lung]
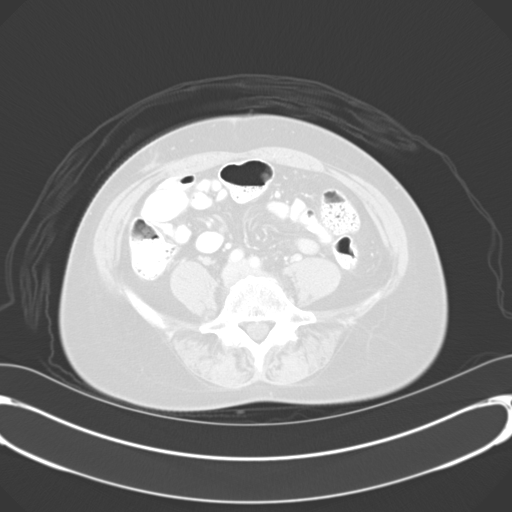
[im 49/123  mediastinal]
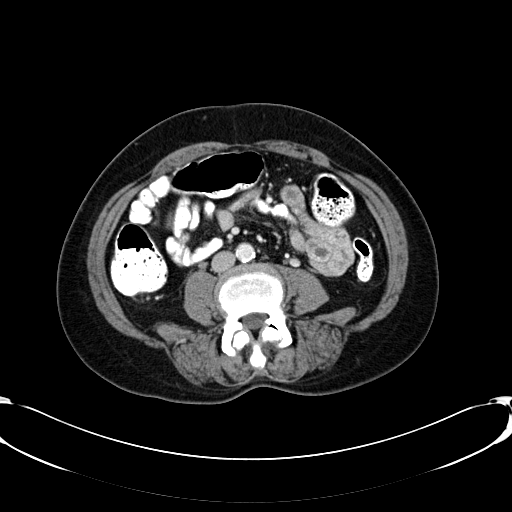
[im 49/123  lung]
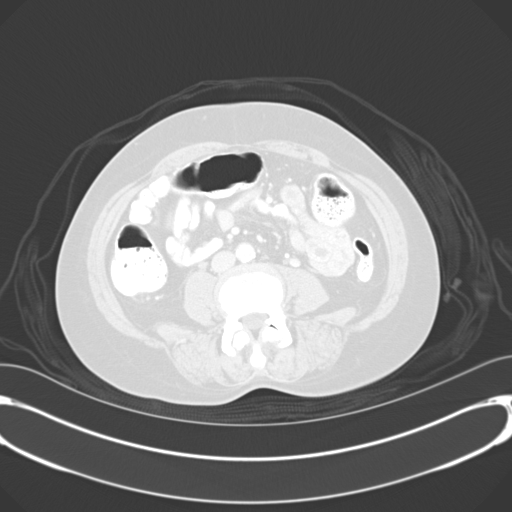
[im 57/123  lung]
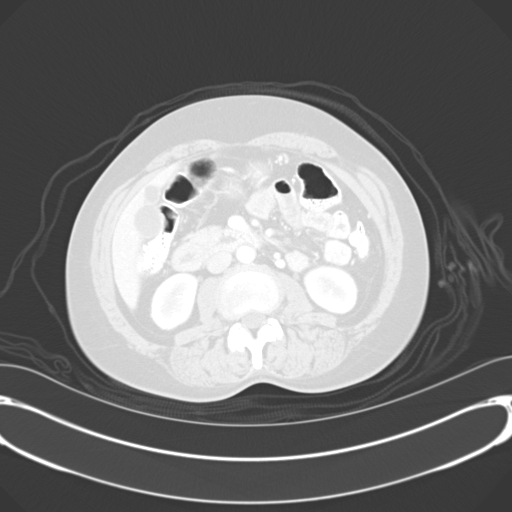
[im 60/123  lung]
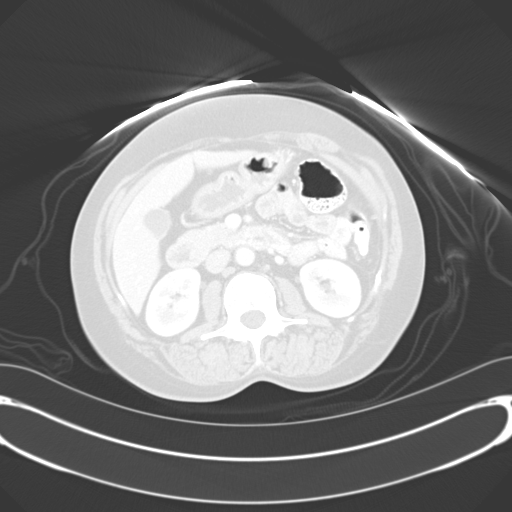
[im 62/123  lung]
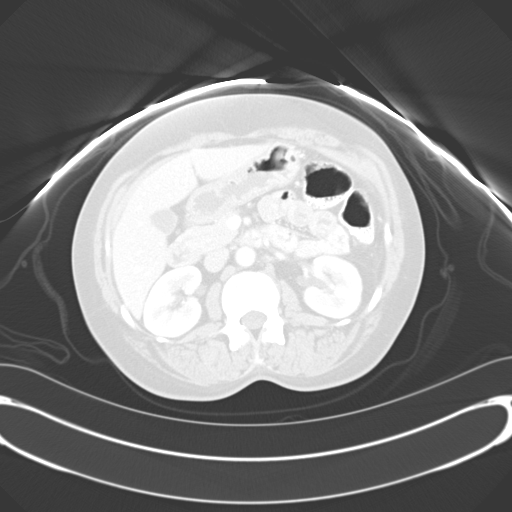
[im 66/123  mediastinal]
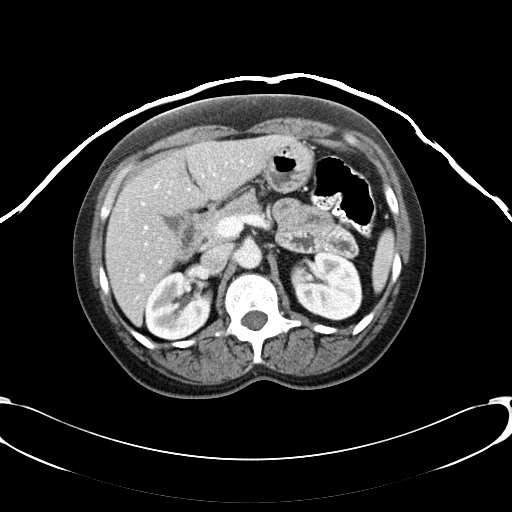
[im 66/123  lung]
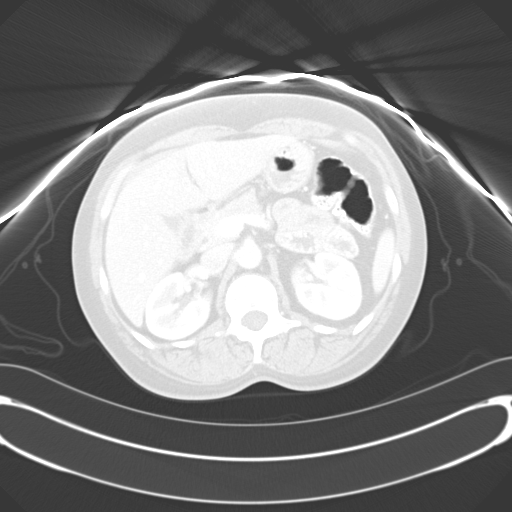
[im 74/123  lung]
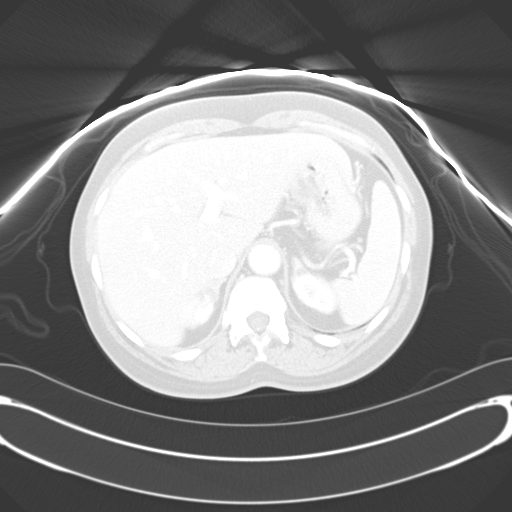
[im 90/123  lung]
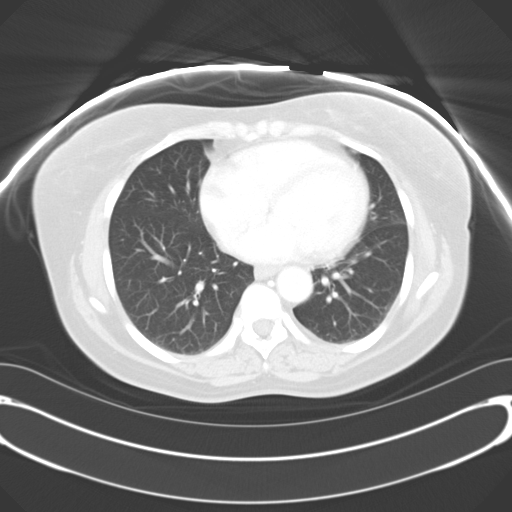
[im 98/123  lung]
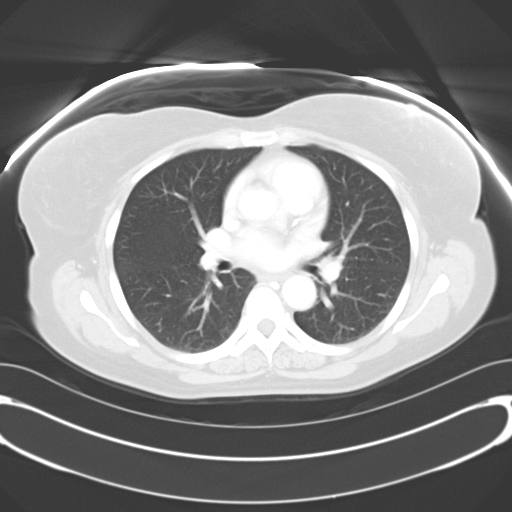
[im 106/123  mediastinal]
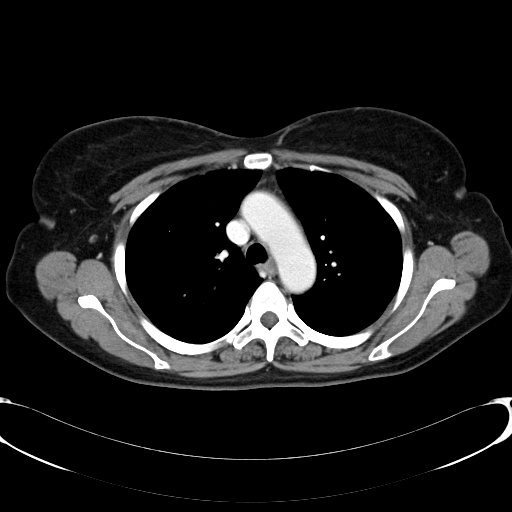
[im 106/123  lung]
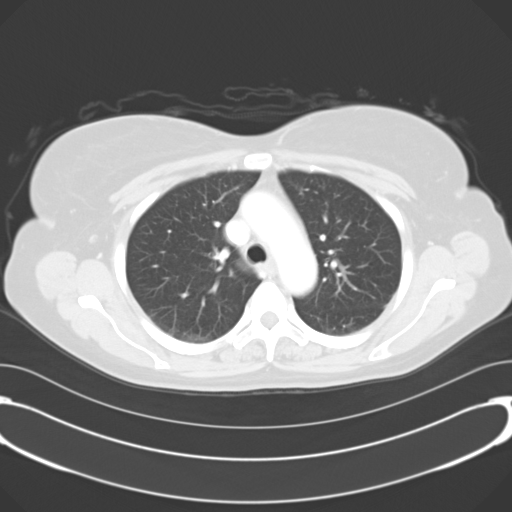
[im 114/123  lung]
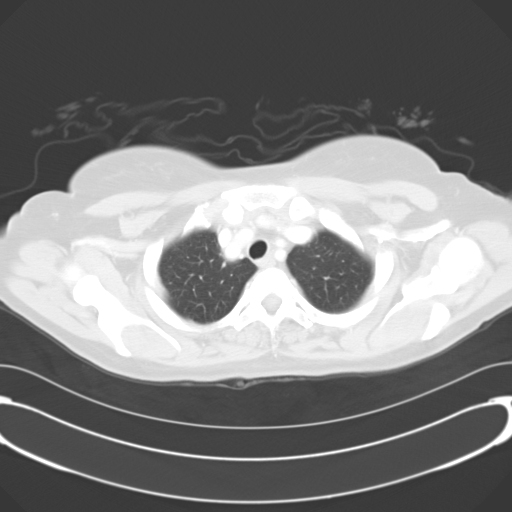

[14 of 31 positions shown; findings below may reference images not displayed]

FINDINGS: No evidence of axillary or supraclavicular
lymphadenopathy.  No mediastinal or hilar lymphadenopathy.  No
pericardial fluid.

Review of the lung parenchyma demonstrates no new or suspicious
pulmonary nodules.  The airways are normal.
IMPRESSION: No evidence of thoracic metastasis.

CT ABDOMEN AND PELVIS
FINDINGS: There is a large cystic lesion in the right hepatic lobe
which measures 29 x 35 mm not significantly changed from 30 x 34 mm
on prior.  This does have simple fluid attenuation but was noted to
enlarged on most recent comparison exam.  Smaller 3 mm hypodensity
in the anterior left hepatic lobe is also unchanged.  Similar tiny
hypodensity in the right hepatic lobe (image 48) is also unchanged.
The gallbladder, pancreas, spleen, adrenal glands, and kidneys are
normal.

The stomach, small bowel, cecum and appendix are normal.  The colon
and rectosigmoid colon are normal.  There is a surgical anastomoses
in the distal sigmoid colon without evidence of nodularity or
obstruction.  There is some thickening of the distal rectum which
is similar to prior (image 109).

Abdominal aorta is normal caliber.  No retroperitoneal or
periportal lymphadenopathy.

No free fluid the pelvis.  There is some fat stranding and fascial
thickening within the presacral soft tissues which is similar to
prior.  No evidence of lymphadenopathy the uterus, ovaries, and
bladder are normal.  Mild thickening of the rectal wall is
unchanged.

Review of  bone windows demonstrates no aggressive osseous lesions.
IMPRESSION: 1.  No evidence of local rectal cancer recurrence or metastasis
within the abdomen pelvis.
2.  Thickening of distal rectum as well as stranding in the
presacral soft tissues likely relates to postsurgical or
postradiation change.
3.  Stable hepatic hypodensities.

## 2012-10-27 ENCOUNTER — Ambulatory Visit (HOSPITAL_COMMUNITY)
Admission: RE | Admit: 2012-10-27 | Discharge: 2012-10-27 | Disposition: A | Discharge status: Home / Self Care | Payer: BC Managed Care – PPO | Source: Ambulatory Visit | Attending: Oncology | Admitting: Oncology

## 2012-10-27 ENCOUNTER — Other Ambulatory Visit (HOSPITAL_BASED_OUTPATIENT_CLINIC_OR_DEPARTMENT_OTHER): Payer: BC Managed Care – PPO | Admitting: Lab

## 2012-10-27 DIAGNOSIS — Z980 Intestinal bypass and anastomosis status: Secondary | ICD-10-CM | POA: Insufficient documentation

## 2012-10-27 DIAGNOSIS — K7689 Other specified diseases of liver: Secondary | ICD-10-CM | POA: Insufficient documentation

## 2012-10-27 DIAGNOSIS — C189 Malignant neoplasm of colon, unspecified: Secondary | ICD-10-CM

## 2012-10-27 DIAGNOSIS — I2510 Atherosclerotic heart disease of native coronary artery without angina pectoris: Secondary | ICD-10-CM | POA: Insufficient documentation

## 2012-10-27 LAB — CBC WITH DIFFERENTIAL/PLATELET
BASO%: 0.7 % (ref 0.0–2.0)
EOS%: 1.2 % (ref 0.0–7.0)
LYMPH%: 16.5 % (ref 14.0–49.7)
MCH: 29.2 pg (ref 25.1–34.0)
MCHC: 33.3 g/dL (ref 31.5–36.0)
MCV: 87.7 fL (ref 79.5–101.0)
MONO%: 9.3 % (ref 0.0–14.0)
Platelets: 162 10*3/uL (ref 145–400)
RBC: 3.9 10*6/uL (ref 3.70–5.45)
WBC: 4.8 10*3/uL (ref 3.9–10.3)

## 2012-10-27 LAB — COMPREHENSIVE METABOLIC PANEL (CC13)
ALT: 12 U/L (ref 0–55)
AST: 16 U/L (ref 5–34)
BUN: 14.9 mg/dL (ref 7.0–26.0)
Creatinine: 0.8 mg/dL (ref 0.6–1.1)
Total Bilirubin: 0.36 mg/dL (ref 0.20–1.20)

## 2012-10-27 LAB — CEA: CEA: <0.5 ng/mL (ref 0.0–5.0)

## 2012-10-27 MED ORDER — IOHEXOL 300 MG/ML  SOLN
100.0000 mL | Freq: Once | INTRAMUSCULAR | Status: AC | PRN
  Administered 2012-10-27: 100 mL via INTRAVENOUS

## 2012-11-03 ENCOUNTER — Telehealth: Payer: Self-pay | Admitting: Oncology

## 2012-11-03 ENCOUNTER — Encounter: Payer: Self-pay | Admitting: *Deleted

## 2012-11-03 ENCOUNTER — Ambulatory Visit (HOSPITAL_BASED_OUTPATIENT_CLINIC_OR_DEPARTMENT_OTHER): Payer: BC Managed Care – PPO | Admitting: Oncology

## 2012-11-03 VITALS — BP 169/94 | HR 61 | Temp 96.9°F | Resp 20 | Ht 65.5 in | Wt 129.7 lb

## 2012-11-03 DIAGNOSIS — C19 Malignant neoplasm of rectosigmoid junction: Secondary | ICD-10-CM

## 2012-11-03 DIAGNOSIS — I10 Essential (primary) hypertension: Secondary | ICD-10-CM

## 2012-11-03 DIAGNOSIS — C189 Malignant neoplasm of colon, unspecified: Secondary | ICD-10-CM

## 2012-11-03 NOTE — Progress Notes (Signed)
Hematology and Oncology Follow Up Walsh  [name] [id number] 1956-09-07 57 y.o. 11/03/2012 10:49 AM  CC: [name], MD  [name], M.D.  [name], M.D.  [name], M.D.    Principle Diagnosis:  A 57 year old female diagnosed with a T3 N1, moderately differentiated adenocarcinoma of the upper rectum diagnosed in March 2009.  Prior Therapy: 1. Status post laparoscopic low anterior resection.  Tumor was T3 N1 with 3 out of 19 lymph nodes involved.  Operation was done in March 2009.  2. The patient received radiation therapy concomitantly with Xeloda.  Therapy concluded on February 22, 2008.  3. She is status post ostomy takedown in August 2009.  4. She received 6 months of therapy adjuvantly, receiving oxaliplatin and Xeloda therapy, concluded in December 2009.  Current therapy: Observation and surveillance.  Interim History:  [name] presents today for a followup Walsh.  A very pleasant 57 year old with the above history.  She has continued to have no evidence of any recurrent disease.  She is over 4 years out from her operation and from completion of her systemic chemotherapy.  She is doing very good at this point and really not reporting any major health concerns.  She is not reporting any nausea or vomiting.  Had not reported any abdominal distention.  She does report some occasional diarrhea.  Had not reported any recurrent neurological symptoms or residual neuropathy.  She has very faint sensory neuropathy in her lower extremity, but had not really changed or gotten worse by any stretch.  She has continued to work full-time and have excellent quality of life and performance status.   Medications: I have reviewed the patient's current medications. Current outpatient prescriptions:esomeprazole (NEXIUM) 20 MG capsule, Take 40 mg by mouth daily before breakfast. , Disp: , Rfl: ;  metoprolol succinate (TOPROL-XL) 100 MG 24 hr tablet, Take 100 mg by mouth daily. Take  with or immediately following a meal., Disp: , Rfl: ;  naproxen sodium (ANAPROX) 220 MG tablet, Take 220 mg by mouth as needed., Disp: , Rfl: ;  verapamil (CALAN) 120 MG tablet, Take 120 mg by mouth daily., Disp: , Rfl:   Allergies: No Known Allergies  Past Medical History, Surgical history, Social history, and Family History were reviewed and updated.  Review of Systems: Constitutional:  Negative for fever, chills, night sweats, anorexia, weight loss, pain. Cardiovascular: no chest pain or dyspnea on exertion Respiratory: no cough, shortness of breath, or wheezing Neurological: no TIA or stroke symptoms Dermatological: negative ENT: negative Skin: Negative. Gastrointestinal: no abdominal pain, change in bowel habits, or black or bloody stools Genito-Urinary: no dysuria, trouble voiding, or hematuria Hematological and Lymphatic: negative Breast: negative Musculoskeletal: negative Remaining ROS negative. Physical Exam: Blood pressure 169/94, pulse 61, temperature 96.9 F (36.1 C), temperature source Oral, resp. rate 20, height 5' 5.5" (1.664 m), weight 129 lb 11.2 oz (58.832 kg). ECOG: 0 General appearance: alert Head: Normocephalic, without obvious abnormality, atraumatic Neck: no adenopathy, no carotid bruit, no JVD, supple, symmetrical, trachea midline and thyroid not enlarged, symmetric, no tenderness/mass/nodules Lymph nodes: Cervical, supraclavicular, and axillary nodes normal. Heart:regular rate and rhythm, S1, S2 normal, no murmur, click, rub or gallop Lung:chest clear, no wheezing, rales, normal symmetric air entry Abdomin: soft, non-tender, without masses or organomegaly EXT:no erythema, induration, or nodules   Lab Results: Lab Results  Component Value Date   WBC 4.8 10/27/2012   HGB 11.4* 10/27/2012   HCT 34.2* 10/27/2012   MCV  87.7 10/27/2012   PLT 162 10/27/2012     Chemistry      Component Value Date/Time   NA 137 10/27/2012 0913   NA 142 05/05/2012 1110   NA 144  10/22/2011 0818   K 3.8 10/27/2012 0913   K 3.9 05/05/2012 1110   K 3.9 10/22/2011 0818   CL 101 10/27/2012 0913   CL 105 05/05/2012 1110   CL 105 10/22/2011 0818   CO2 25 10/27/2012 0913   CO2 27 05/05/2012 1110   CO2 28 10/22/2011 0818   BUN 14.9 10/27/2012 0913   BUN 13 05/05/2012 1110   BUN 11 10/22/2011 0818   CREATININE 0.8 10/27/2012 0913   CREATININE 0.89 05/05/2012 1110   CREATININE 0.7 10/22/2011 0818      Component Value Date/Time   CALCIUM 8.5 10/27/2012 0913   CALCIUM 9.1 05/05/2012 1110   CALCIUM 8.7 10/22/2011 0818   ALKPHOS 80 10/27/2012 0913   ALKPHOS 73 05/05/2012 1110   ALKPHOS 94* 10/22/2011 0818   AST 16 10/27/2012 0913   AST 16 05/05/2012 1110   AST 23 10/22/2011 0818   ALT 12 10/27/2012 0913   ALT 9 05/05/2012 1110   BILITOT 0.36 10/27/2012 0913   BILITOT 0.4 05/05/2012 1110   BILITOT 0.50 10/22/2011 0818     CT CHEST, ABDOMEN AND PELVIS WITH CONTRAST  Technique: Multidetector CT imaging of the chest, abdomen and  pelvis was performed following the standard protocol during bolus  administration of intravenous contrast.  Contrast: OMNIPAQUE IOHEXOL 300 MG/ML SOLN  Comparison: 10/22/2011  CT CHEST  Findings: Lungs/pleura: No pleural effusion. Scar is identified  within the left base, image 43. There is no pulmonary nodule or  mass identified.  Heart/Mediastinum: No mediastinal or hilar adenopathy. Normal heart  size. No pericardial effusion. Calcification identified within  the LAD coronary artery.  Bones/Musculoskeletal: Review of the visualized bony structures is  unremarkable.  IMPRESSION:  1. No acute findings. No evidence of metastatic disease.  CT ABDOMEN AND PELVIS  Findings: Mildly complex cyst within the right hepatic lobe  measures 3.6 x 3.4 cm, image 47. Previously 3.7 x 3.2 cm. Tiny  hypodensity within the medial segment of the left hepatic lobe is  unchanged, image 50. Stable hypodensity within the periphery of  the posterior inferior right hepatic lobe, image  57/series 2.  The gallbladder appears within normal limits. There is no biliary  dilatation. The pancreas is unremarkable. Normal appearance of  the spleen.  Both of the adrenal glands are within normal limits. The right  kidney is normal.  The left kidney is normal. Urinary bladder is unremarkable. The  uterus and adnexal structures are negative.  Mild calcified atherosclerotic changes involve the abdominal aorta.  No aneurysm noted. No upper abdominal adenopathy identified.  There is no pelvic or inguinal adenopathy noted.  No free fluid or abnormal fluid collections identified within the  abdomen or the pelvis. The stomach and the small bowel loops are  unremarkable. Normal appearance of the colon.  The rectosigmoid anastomose has a stable appearance, image  101/series 2. Similar appearance of stranding in the perirectal  and presacral fat.  Review of the visualized bony structures is significant for mild  degenerative disc disease. This is most severe at the L4-5 level  where there is a first-degree anterolisthesis.  IMPRESSION:  1. No acute findings.  2. Stable postsurgical changes in the pelvis with peri rectal  presacral fibrosis. No local recurrence or evidence of metastatic  disease.   Impression and Plan:  This is a pleasant 57 year old female with the following issues:  1. Stage III colorectal cancer.  She remains disease free more than 4 years out now.  She is status post surgical resection and received adjuvant radiation therapy due to her disease being slightly under the peritoneal reflection, followed by 6 months of chemotherapy and currently really no evidence to suggest recurrent disease by her recent scan and blood testing or colonoscopy. The plan is to continue with active surveillance followup every 6 months and imaging study is every 12 months.  2. Screening colonoscopy will be due in 5 years.  3. Hypertension is under excellent control.  Followup will be in 6  months' time with labs and exam.   Zori Benbrook, MD 2/11/201410:49 AM

## 2012-11-03 NOTE — Telephone Encounter (Signed)
gv and printed appt schedule for pt for Aug °

## 2013-05-04 ENCOUNTER — Telehealth: Payer: Self-pay | Admitting: Oncology

## 2013-05-04 ENCOUNTER — Other Ambulatory Visit (HOSPITAL_BASED_OUTPATIENT_CLINIC_OR_DEPARTMENT_OTHER): Payer: BC Managed Care – PPO | Admitting: Lab

## 2013-05-04 ENCOUNTER — Ambulatory Visit (HOSPITAL_BASED_OUTPATIENT_CLINIC_OR_DEPARTMENT_OTHER): Payer: BC Managed Care – PPO | Admitting: Oncology

## 2013-05-04 VITALS — BP 156/91 | HR 61 | Temp 97.1°F | Resp 18 | Ht 65.5 in | Wt 128.3 lb

## 2013-05-04 DIAGNOSIS — C189 Malignant neoplasm of colon, unspecified: Secondary | ICD-10-CM

## 2013-05-04 DIAGNOSIS — Z85038 Personal history of other malignant neoplasm of large intestine: Secondary | ICD-10-CM

## 2013-05-04 DIAGNOSIS — I10 Essential (primary) hypertension: Secondary | ICD-10-CM

## 2013-05-04 LAB — COMPREHENSIVE METABOLIC PANEL (CC13)
ALT: 10 U/L (ref 0–55)
BUN: 12.1 mg/dL (ref 7.0–26.0)
CO2: 23 mEq/L (ref 22–29)
Calcium: 8.9 mg/dL (ref 8.4–10.4)
Chloride: 108 mEq/L (ref 98–109)
Creatinine: 0.9 mg/dL (ref 0.6–1.1)
Total Bilirubin: 0.38 mg/dL (ref 0.20–1.20)

## 2013-05-04 LAB — CBC WITH DIFFERENTIAL/PLATELET
BASO%: 1 % (ref 0.0–2.0)
Basophils Absolute: 0 10*3/uL (ref 0.0–0.1)
EOS%: 3.6 % (ref 0.0–7.0)
HCT: 35 % (ref 34.8–46.6)
HGB: 11.7 g/dL (ref 11.6–15.9)
LYMPH%: 23.3 % (ref 14.0–49.7)
MCH: 28.6 pg (ref 25.1–34.0)
MCHC: 33.3 g/dL (ref 31.5–36.0)
MONO#: 0.4 10*3/uL (ref 0.1–0.9)
NEUT%: 61.7 % (ref 38.4–76.8)
Platelets: 157 10*3/uL (ref 145–400)

## 2013-05-04 NOTE — Telephone Encounter (Signed)
Moved all appt to tueday per pt rqst

## 2013-05-04 NOTE — Telephone Encounter (Signed)
gave pt appt for lab, Ct before Md on february 2014, gave pt oral contrast

## 2013-05-04 NOTE — Progress Notes (Signed)
Hematology and Oncology Follow Up Visit  Tiffany Walsh [id number] 1956-07-27 57 y.o. 05/04/2013 10:00 AM  CC: Tiffany Muckle, MD  Tiffany Walsh, M.D.  Tiffany Walsh, M.D.  Tiffany Walsh, M.D.    Principle Diagnosis:  A 57 year old female diagnosed with a T3 N1, moderately differentiated adenocarcinoma of the upper rectum diagnosed in March 2009.  Prior Therapy: 1. Status post laparoscopic low anterior resection.  Tumor was T3 N1 with 3 out of 19 lymph nodes involved.  Operation was done in March 2009.  2. The patient received radiation therapy concomitantly with Xeloda.  Therapy concluded on February 22, 2008.  3. She is status post ostomy takedown in August 2009.  4. She received 6 months of therapy adjuvantly, receiving oxaliplatin and Xeloda therapy, concluded in December 2009.  Current therapy: Observation and surveillance.  Interim History:  [name] presents today for a followup visit.  A very pleasant 57 year old with the above history.  She has continued to have no evidence of any recurrent disease.  She is over 5 years out from her operation and from completion of her systemic chemotherapy. She is doing very good at this point and really not reporting any major health concerns.  She is not reporting any nausea or vomiting.  Had not reported any abdominal distention.  She does report some occasional diarrhea.  Had not reported any recurrent neurological symptoms or residual neuropathy.  She has very faint sensory neuropathy in her lower extremity, but had not really changed or gotten worse by any stretch.  She has continued to work full-time and have excellent quality of life and performance status.   Medications: I have reviewed the patient's current medications.   Allergies: No Known Allergies  Past Medical History, Surgical history, Social history, and Family History were reviewed and updated.  Review of Systems: Constitutional:  Negative for fever, chills, night sweats,  anorexia, weight loss, pain. Cardiovascular: no chest pain or dyspnea on exertion Respiratory: no cough, shortness of breath, or wheezing Neurological: no TIA or stroke symptoms Dermatological: negative ENT: negative Skin: Negative. Gastrointestinal: no abdominal pain, change in bowel habits, or black or bloody stools Genito-Urinary: no dysuria, trouble voiding, or hematuria Hematological and Lymphatic: negative Breast: negative Musculoskeletal: negative Remaining ROS negative. Physical Exam: Blood pressure 156/91, pulse 61, temperature 97.1 F (36.2 C), temperature source Oral, resp. rate 18, height 5' 5.5" (1.664 m), weight 128 lb 4.8 oz (58.196 kg). ECOG: 0 General appearance: alert Head: Normocephalic, without obvious abnormality, atraumatic Neck: no adenopathy, no carotid bruit, no JVD, supple, symmetrical, trachea midline and thyroid not enlarged, symmetric, no tenderness/mass/nodules Lymph nodes: Cervical, supraclavicular, and axillary nodes normal. Heart:regular rate and rhythm, S1, S2 normal, no murmur, click, rub or gallop Lung:chest clear, no wheezing, rales, normal symmetric air entry Abdomin: soft, non-tender, without masses or organomegaly EXT:no erythema, induration, or nodules   Lab Results: Lab Results  Component Value Date   WBC 3.9 05/04/2013   HGB 11.7 05/04/2013   HCT 35.0 05/04/2013   MCV 85.9 05/04/2013   PLT 157 05/04/2013     Impression and Plan:  This is a pleasant 57 year old female with the following issues:  1. Stage III colorectal cancer.  She remains disease free more than 5 years out now.  She is status post surgical resection and received adjuvant radiation therapy due to her disease being slightly under the peritoneal reflection, followed by 6 months of chemotherapy and currently really no evidence to suggest recurrent disease by  her recent scan and blood testing or colonoscopy. The plan is to continue with active surveillance. She will follow up  in 6 months and have a CT scan then as well.  2. Screening colonoscopy will be due in 5 years.  3. Hypertension: followed by her PCP. Her medications have been adjusted recently.   [facility], MD 8/12/201410:00 AM

## 2013-10-26 ENCOUNTER — Ambulatory Visit (HOSPITAL_COMMUNITY)
Admission: RE | Admit: 2013-10-26 | Discharge: 2013-10-26 | Disposition: A | Discharge status: Home / Self Care | Payer: BC Managed Care – PPO | Source: Ambulatory Visit | Attending: Oncology | Admitting: Oncology

## 2013-10-26 ENCOUNTER — Other Ambulatory Visit (HOSPITAL_BASED_OUTPATIENT_CLINIC_OR_DEPARTMENT_OTHER): Payer: BC Managed Care – PPO

## 2013-10-26 ENCOUNTER — Encounter (HOSPITAL_COMMUNITY): Payer: Self-pay

## 2013-10-26 DIAGNOSIS — M5137 Other intervertebral disc degeneration, lumbosacral region: Secondary | ICD-10-CM | POA: Insufficient documentation

## 2013-10-26 DIAGNOSIS — K7689 Other specified diseases of liver: Secondary | ICD-10-CM | POA: Insufficient documentation

## 2013-10-26 DIAGNOSIS — M51379 Other intervertebral disc degeneration, lumbosacral region without mention of lumbar back pain or lower extremity pain: Secondary | ICD-10-CM | POA: Insufficient documentation

## 2013-10-26 DIAGNOSIS — C189 Malignant neoplasm of colon, unspecified: Secondary | ICD-10-CM

## 2013-10-26 DIAGNOSIS — C19 Malignant neoplasm of rectosigmoid junction: Secondary | ICD-10-CM

## 2013-10-26 LAB — COMPREHENSIVE METABOLIC PANEL (CC13)
ALT: 13 U/L (ref 0–55)
AST: 17 U/L (ref 5–34)
Albumin: 4 g/dL (ref 3.5–5.0)
Alkaline Phosphatase: 87 U/L (ref 40–150)
Anion Gap: 9 meq/L (ref 3–11)
BUN: 12 mg/dL (ref 7.0–26.0)
CO2: 27 meq/L (ref 22–29)
Calcium: 9.4 mg/dL (ref 8.4–10.4)
Chloride: 103 meq/L (ref 98–109)
Creatinine: 0.8 mg/dL (ref 0.6–1.1)
Glucose: 93 mg/dL (ref 70–140)
Potassium: 4.6 meq/L (ref 3.5–5.1)
Sodium: 139 meq/L (ref 136–145)
Total Bilirubin: 0.22 mg/dL (ref 0.20–1.20)
Total Protein: 7.3 g/dL (ref 6.4–8.3)

## 2013-10-26 LAB — CBC WITH DIFFERENTIAL/PLATELET
BASO%: 0.6 % (ref 0.0–2.0)
Basophils Absolute: 0 10*3/uL (ref 0.0–0.1)
EOS%: 1.2 % (ref 0.0–7.0)
Eosinophils Absolute: 0.1 10*3/uL (ref 0.0–0.5)
HCT: 35.3 % (ref 34.8–46.6)
HGB: 11.7 g/dL (ref 11.6–15.9)
LYMPH%: 22.3 % (ref 14.0–49.7)
MCH: 29 pg (ref 25.1–34.0)
MCHC: 33.1 g/dL (ref 31.5–36.0)
MCV: 87.4 fL (ref 79.5–101.0)
MONO#: 0.4 10*3/uL (ref 0.1–0.9)
MONO%: 8.7 % (ref 0.0–14.0)
NEUT#: 3.5 10*3/uL (ref 1.5–6.5)
NEUT%: 67.2 % (ref 38.4–76.8)
Platelets: 181 10*3/uL (ref 145–400)
RBC: 4.03 10*6/uL (ref 3.70–5.45)
RDW: 14.2 % (ref 11.2–14.5)
WBC: 5.1 10*3/uL (ref 3.9–10.3)
lymph#: 1.1 10*3/uL (ref 0.9–3.3)

## 2013-10-26 MED ORDER — IOHEXOL 300 MG/ML  SOLN
80.0000 mL | Freq: Once | INTRAMUSCULAR | Status: AC | PRN
  Administered 2013-10-26: 80 mL via INTRAVENOUS

## 2013-11-02 ENCOUNTER — Ambulatory Visit (HOSPITAL_BASED_OUTPATIENT_CLINIC_OR_DEPARTMENT_OTHER): Payer: BC Managed Care – PPO | Admitting: Oncology

## 2013-11-02 ENCOUNTER — Encounter: Payer: Self-pay | Admitting: Oncology

## 2013-11-02 ENCOUNTER — Telehealth: Payer: Self-pay | Admitting: Oncology

## 2013-11-02 VITALS — BP 164/77 | HR 59 | Temp 97.6°F | Resp 18 | Ht 65.0 in | Wt 129.6 lb

## 2013-11-02 DIAGNOSIS — C189 Malignant neoplasm of colon, unspecified: Secondary | ICD-10-CM

## 2013-11-02 DIAGNOSIS — I10 Essential (primary) hypertension: Secondary | ICD-10-CM

## 2013-11-02 DIAGNOSIS — Z85048 Personal history of other malignant neoplasm of rectum, rectosigmoid junction, and anus: Secondary | ICD-10-CM

## 2013-11-02 NOTE — Telephone Encounter (Signed)
Gave pt appt for lab and MD for February 2015, also gave pt oral contrast for Ct

## 2013-11-02 NOTE — Progress Notes (Signed)
Hematology and Oncology Follow Up Visit  Tiffany Walsh [id number] [birth year] 58 y.o. 11/02/2013 9:39 AM  CC: Tiffany Loan, MD  Tiffany Walsh, M.D.  Tiffany Walsh, M.D.  Tiffany Walsh, M.D.    Principle Diagnosis:  A 58 year old female diagnosed with a T3 N1, moderately differentiated adenocarcinoma of the upper rectum diagnosed in March 2009.  Prior Therapy: 1. Status post laparoscopic low anterior resection.  Tumor was T3 N1 with 3 out of 19 lymph nodes involved.  Operation was done in March 2009.  2. The patient received radiation therapy concomitantly with Xeloda.  Therapy concluded on February 22, 2008.  3. She is status post ostomy takedown in August 2009.  4. She received 6 months of therapy adjuvantly, receiving oxaliplatin and Xeloda therapy, concluded in December 2009.  Current therapy: Observation and surveillance.  Interim History:  Tiffany Walsh presents today for a followup visit.  A very pleasant 58 year old with the above history.  She has continued to have no evidence of any recurrent disease.  She is over 5 years out from her operation and from completion of her systemic chemotherapy. She is doing very good at this point and really not reporting any major health concerns.  She is not reporting any nausea or vomiting.  Had not reported any abdominal distention.  She does report some occasional diarrhea.  Had not reported any recurrent neurological symptoms or residual neuropathy.  She has very faint sensory neuropathy in her lower extremity, but had not really changed or gotten worse by any stretch.  She has continued to work full-time and have excellent quality of life and performance status. She does reports occasional painful bowel movements associated with hemorrhoids. She has not reported any weight loss or appetite changes.  Medications: I have reviewed the patient's current medications.   Allergies: No Known Allergies  Past Medical History, Surgical history, Social  history, and Family History were reviewed and updated.  Review of Systems: Constitutional:  Negative for fever, chills, night sweats, anorexia, weight loss, pain. Cardiovascular: no chest pain or dyspnea on exertion Respiratory: no cough, shortness of breath, or wheezing Neurological: no TIA or stroke symptoms  Remaining ROS negative. Physical Exam: Blood pressure 164/77, pulse 59, temperature 97.6 F (36.4 C), temperature source Oral, resp. rate 18, height 5\' 5"  (1.651 m), weight 129 lb 9.6 oz (58.786 kg), SpO2 97.00%. ECOG: 0 General appearance: alert Head: Normocephalic, without obvious abnormality, atraumatic Neck: no adenopathy, no carotid bruit, no JVD, supple, symmetrical, trachea midline and thyroid not enlarged, symmetric, no tenderness/mass/nodules Lymph nodes: Cervical, supraclavicular, and axillary nodes normal. Heart:regular rate and rhythm, S1, S2 normal, no murmur, click, rub or gallop Lung:chest clear, no wheezing, rales, normal symmetric air entry Abdomin: soft, non-tender, without masses or organomegaly EXT:no erythema, induration, or nodules   Lab Results: Lab Results  Component Value Date   WBC 5.1 10/26/2013   HGB 11.7 10/26/2013   HCT 35.3 10/26/2013   MCV 87.4 10/26/2013   PLT 181 10/26/2013   EXAM:  CT CHEST, ABDOMEN, AND PELVIS WITH CONTRAST  TECHNIQUE:  Multidetector CT imaging of the chest, abdomen and pelvis was  performed following the standard protocol during bolus  administration of intravenous contrast.  CONTRAST: 77mL OMNIPAQUE IOHEXOL 300 MG/ML SOLN  COMPARISON: 10/27/2012  FINDINGS:  CT CHEST FINDINGS  No pleural effusion. No airspace consolidation or atelectasis. No  suspicious pulmonary nodules or mass is identified. The trachea  appears patent and is midline.  The heart size is  normal. There is no pericardial effusion  identified. Calcification within the LAD coronary artery is noted.  No enlarged mediastinal or hilar lymph nodes. There is no  axillary  or supraclavicular lymph nodes.  Review of the visualized bony structures is unremarkable. No  aggressive lytic or sclerotic bone lesions.  CT ABDOMEN AND PELVIS FINDINGS  Stable cyst in the right hepatic lobe measuring 4 mm, image  48/series 2. Stable 4 mm low attenuation structure, image 48/series  2. There is a stable 3 mm hypodensity within the central right  hepatic lobe. The gallbladder is normal. No biliary dilatation.  Normal appearance of the spleen.  The adrenal glands appear normal. Normal appearance of the kidneys.  The urinary bladder is normal. The uterus and the adnexal structures  are unremarkable.  Normal caliber of the abdominal aorta. There is atherosclerotic  disease. No aneurysm. No upper abdominal adenopathy. No pelvic or  inguinal adenopathy.  The stomach appears normal. The small bowel loops have a normal  course and caliber. The appendix is within normal limits. The  proximal colon appears within normal limits. The rectosigmoid  anastomosis has a stable appearance. Similar appearance of stranding  in the perirectal and presacral fat.  Review of the visualized bony structures is remarkable for a mild  lumbar degenerative disc disease and facet degenerative change. A  first degree anterolisthesis of L4 on L5 is again noted.  IMPRESSION:  1. No acute findings. There is no evidence for local recurrence or  evidence of metastatic disease within the chest abdomen or pelvis.  2. Stable postsurgical changes in the pelvis with perirectal and  presacral fibrosis.   Impression and Plan:  This is a pleasant 58 year old female with the following issues:  1. Stage III colorectal cancer.  She remains disease free more than 5 years out now.  She is status post surgical resection and received adjuvant radiation therapy due to her disease being slightly under the peritoneal reflection, followed by 6 months of chemotherapy. Her laboratory data and CT scan from  10/26/2013 were discussed today and showed no evidence of any recurrent disease. The plan is to continue with active surveillance and return in one year and repeat imaging studies and a CT scan. 2. Screening colonoscopy will be due in 5 years.  3. Hypertension: followed by her PCP. Her medications have been adjusted recently.   [facility], MD 2/10/20159:39 AM

## 2014-03-15 ENCOUNTER — Other Ambulatory Visit: Payer: Self-pay | Admitting: Gastroenterology

## 2014-03-15 DIAGNOSIS — R1013 Epigastric pain: Secondary | ICD-10-CM

## 2014-03-16 ENCOUNTER — Ambulatory Visit
Admission: RE | Admit: 2014-03-16 | Discharge: 2014-03-16 | Disposition: A | Discharge status: Home / Self Care | Payer: BC Managed Care – PPO | Source: Ambulatory Visit | Attending: Gastroenterology | Admitting: Gastroenterology

## 2014-03-16 DIAGNOSIS — R1013 Epigastric pain: Secondary | ICD-10-CM

## 2014-04-19 ENCOUNTER — Other Ambulatory Visit: Payer: Self-pay | Admitting: Gastroenterology

## 2014-06-22 ENCOUNTER — Other Ambulatory Visit (HOSPITAL_COMMUNITY): Payer: Self-pay | Admitting: Gastroenterology

## 2014-06-22 DIAGNOSIS — R1010 Upper abdominal pain, unspecified: Secondary | ICD-10-CM

## 2014-06-28 ENCOUNTER — Encounter (HOSPITAL_COMMUNITY)
Admission: RE | Admit: 2014-06-28 | Discharge: 2014-06-28 | Disposition: A | Discharge status: Home / Self Care | Payer: BC Managed Care – PPO | Source: Ambulatory Visit | Attending: Gastroenterology | Admitting: Gastroenterology

## 2014-06-28 DIAGNOSIS — R1010 Upper abdominal pain, unspecified: Secondary | ICD-10-CM | POA: Insufficient documentation

## 2014-06-28 MED ORDER — TECHNETIUM TC 99M SULFUR COLLOID
2.2000 | Freq: Once | INTRAVENOUS | Status: AC | PRN
Start: 2014-06-28 — End: 2014-06-28
  Administered 2014-06-28: 2.2 via ORAL

## 2014-11-01 ENCOUNTER — Encounter (HOSPITAL_COMMUNITY): Payer: Self-pay

## 2014-11-01 ENCOUNTER — Other Ambulatory Visit (HOSPITAL_BASED_OUTPATIENT_CLINIC_OR_DEPARTMENT_OTHER): Payer: 59

## 2014-11-01 ENCOUNTER — Ambulatory Visit (HOSPITAL_COMMUNITY)
Admission: RE | Admit: 2014-11-01 | Discharge: 2014-11-01 | Disposition: A | Discharge status: Home / Self Care | Payer: 59 | Source: Ambulatory Visit | Attending: Oncology | Admitting: Oncology

## 2014-11-01 ENCOUNTER — Telehealth: Payer: Self-pay | Admitting: Oncology

## 2014-11-01 DIAGNOSIS — C189 Malignant neoplasm of colon, unspecified: Secondary | ICD-10-CM | POA: Insufficient documentation

## 2014-11-01 DIAGNOSIS — Z9221 Personal history of antineoplastic chemotherapy: Secondary | ICD-10-CM | POA: Diagnosis not present

## 2014-11-01 DIAGNOSIS — I10 Essential (primary) hypertension: Secondary | ICD-10-CM | POA: Insufficient documentation

## 2014-11-01 DIAGNOSIS — Z85048 Personal history of other malignant neoplasm of rectum, rectosigmoid junction, and anus: Secondary | ICD-10-CM

## 2014-11-01 LAB — CBC WITH DIFFERENTIAL/PLATELET
BASO%: 0.3 % (ref 0.0–2.0)
Basophils Absolute: 0 10*3/uL (ref 0.0–0.1)
EOS ABS: 0.1 10*3/uL (ref 0.0–0.5)
EOS%: 0.9 % (ref 0.0–7.0)
HEMATOCRIT: 31.3 % — AB (ref 34.8–46.6)
HGB: 10.1 g/dL — ABNORMAL LOW (ref 11.6–15.9)
LYMPH#: 1 10*3/uL (ref 0.9–3.3)
LYMPH%: 12.5 % — ABNORMAL LOW (ref 14.0–49.7)
MCH: 28.6 pg (ref 25.1–34.0)
MCHC: 32.3 g/dL (ref 31.5–36.0)
MCV: 88.7 fL (ref 79.5–101.0)
MONO#: 0.8 10*3/uL (ref 0.1–0.9)
MONO%: 9.7 % (ref 0.0–14.0)
NEUT%: 76.6 % (ref 38.4–76.8)
NEUTROS ABS: 6 10*3/uL (ref 1.5–6.5)
PLATELETS: 190 10*3/uL (ref 145–400)
RBC: 3.53 10*6/uL — AB (ref 3.70–5.45)
RDW: 14.2 % (ref 11.2–14.5)
WBC: 7.8 10*3/uL (ref 3.9–10.3)

## 2014-11-01 LAB — COMPREHENSIVE METABOLIC PANEL (CC13)
ALT: 10 U/L (ref 0–55)
AST: 16 U/L (ref 5–34)
Albumin: 3.7 g/dL (ref 3.5–5.0)
Alkaline Phosphatase: 95 U/L (ref 40–150)
Anion Gap: 10 mEq/L (ref 3–11)
BUN: 11.8 mg/dL (ref 7.0–26.0)
CO2: 28 mEq/L (ref 22–29)
Calcium: 8.8 mg/dL (ref 8.4–10.4)
Chloride: 105 mEq/L (ref 98–109)
Creatinine: 0.8 mg/dL (ref 0.6–1.1)
EGFR: >90 mL/min/{1.73_m2} (ref >90)
Glucose: 98 mg/dl (ref 70–140)
POTASSIUM: 3.6 meq/L (ref 3.5–5.1)
SODIUM: 142 meq/L (ref 136–145)
TOTAL PROTEIN: 7.2 g/dL (ref 6.4–8.3)
Total Bilirubin: 0.35 mg/dL (ref 0.20–1.20)

## 2014-11-01 LAB — CEA

## 2014-11-01 MED ORDER — IOHEXOL 300 MG/ML  SOLN
100.0000 mL | Freq: Once | INTRAMUSCULAR | Status: AC | PRN
  Administered 2014-11-01: 100 mL via INTRAVENOUS

## 2014-11-01 NOTE — Telephone Encounter (Signed)
S/w pt confirming updated sch from 02/11 to 02/16 due to work... Tiffany Walsh

## 2014-11-02 ENCOUNTER — Ambulatory Visit: Payer: BC Managed Care – PPO | Admitting: Oncology

## 2014-11-03 ENCOUNTER — Ambulatory Visit: Payer: Self-pay | Admitting: Oncology

## 2014-11-08 ENCOUNTER — Telehealth: Payer: Self-pay | Admitting: Oncology

## 2014-11-08 ENCOUNTER — Ambulatory Visit (HOSPITAL_BASED_OUTPATIENT_CLINIC_OR_DEPARTMENT_OTHER): Payer: 59 | Admitting: Oncology

## 2014-11-08 VITALS — BP 130/76 | HR 61 | Temp 97.8°F | Resp 18 | Ht 65.0 in | Wt 128.5 lb

## 2014-11-08 DIAGNOSIS — N8320 Unspecified ovarian cysts: Secondary | ICD-10-CM

## 2014-11-08 DIAGNOSIS — Z85048 Personal history of other malignant neoplasm of rectum, rectosigmoid junction, and anus: Secondary | ICD-10-CM

## 2014-11-08 DIAGNOSIS — C189 Malignant neoplasm of colon, unspecified: Secondary | ICD-10-CM

## 2014-11-08 DIAGNOSIS — D649 Anemia, unspecified: Secondary | ICD-10-CM

## 2014-11-08 NOTE — Progress Notes (Signed)
Hematology and Oncology Follow Up Visit  Tiffany Walsh [id number] 1956/09/22 59 y.o. 11/08/2014 3:57 PM  CC: [name], MD  [name], M.D.  [name], M.D.  [name], M.D.    Principle Diagnosis:  A 59 year old female diagnosed with a T3 N1, moderately differentiated adenocarcinoma of the upper rectum diagnosed in March 2009.  Prior Therapy: 1. Status post laparoscopic low anterior resection.  Tumor was T3 N1 with 3 out of 19 lymph nodes involved.  Operation was done in March 2009.  2. The patient received radiation therapy concomitantly with Xeloda.  Therapy concluded on February 22, 2008.  3. She is status post ostomy takedown in August 2009.  4. She received 6 months of therapy adjuvantly, receiving oxaliplatin and Xeloda therapy, concluded in December 2009.  Current therapy: Observation and surveillance.  Interim History:  Ms. Rauda presents today for a followup visit. Since the last visit, she has not reported any major complaints. She does report intermittent abdominal discomfort that is described as epigastric and not persistent. Is not associated with any change in her bowel habits. Has not reported any GI, GU or GYN bleeding.  She is not reporting any nausea or vomiting.  Had not reported any abdominal distention.  She does report some occasional diarrhea.  Had not reported any recurrent neurological symptoms or residual neuropathy.  She has very faint sensory neuropathy in her lower extremity, but had not really changed or gotten worse by any stretch.  She has continued to work full-time and have excellent quality of life and performance status. She does reports occasional painful bowel movements associated with hemorrhoids. She has not reported any weight loss or appetite changes. She does not report any headaches or blurry vision or syncope. She does not report any constitutional symptoms. Rest of her review of systems unremarkable.  Medications: I have  reviewed the patient's current medications.   Allergies: No Known Allergies   Physical Exam: Blood pressure 130/76, pulse 61, temperature 97.8 F (36.6 C), temperature source Oral, resp. rate 18, height 5\' 5"  (1.651 m), weight 128 lb 8 oz (58.287 kg), SpO2 100 %. ECOG: 0 General appearance: alert Head: Normocephalic, without obvious abnormality Neck: no adenopathy Lymph nodes: Cervical, supraclavicular, and axillary nodes normal. Heart:regular rate and rhythm, S1, S2 normal, no murmur, click, rub or gallop Lung:chest clear, no wheezing, rales, normal symmetric air entry Abdomin: soft, non-tender, without masses or organomegaly EXT:no erythema, induration, or nodules   Lab Results: Lab Results  Component Value Date   WBC 7.8 11/01/2014   HGB 10.1* 11/01/2014   HCT 31.3* 11/01/2014   MCV 88.7 11/01/2014   PLT 190 11/01/2014   EXAM: CT CHEST, ABDOMEN, AND PELVIS WITH CONTRAST  TECHNIQUE: Multidetector CT imaging of the chest, abdomen and pelvis was performed following the standard protocol during bolus administration of intravenous contrast.  CONTRAST: 160mL OMNIPAQUE IOHEXOL 300 MG/ML SOLN  COMPARISON: Multiple exams, including 10/26/2013  FINDINGS: CT CHEST FINDINGS  Mediastinum/Nodes: No pathologic mediastinal adenopathy. Left anterior descending coronary artery atherosclerotic calcification. Borderline prominent main pulmonary artery, diameter 3.3 cm on image 25 series 2.  Lungs/Pleura: New linear opacity medially in the left lower lobe, image 31 series 4, favoring subsegmental atelectasis based on configuration. Chronic scarring anteriorly in the left lower lobe.  Musculoskeletal: Unremarkable  CT ABDOMEN PELVIS FINDINGS  Hepatobiliary: Dominant 4.5 by 3.6 cm cyst in the right hepatic lobe, possibly with faint internal septation, by my measurements previously 4.3 by 3.8 cm.  A total of 7 additional tiny hypodense lesions are present in the  liver but not changed from 1 year ago, and likely benign given the lack of progression.  Pancreas: Pancreas divisum.  Spleen: Unremarkable  Adrenals/Urinary Tract: Unremarkable  Stomach/Bowel: Rectal anastomotic staple line observed. The cecum is in the central pelvis and the appendix tracks adjacent to the upper rectum on image 96 of series 2.  Vascular/Lymphatic: Unremarkable  Reproductive: 1.7 by 1.2 cm hypodense lesion in the vicinity of the right ovary. Indistinctness of surrounding tissue planes along the uterus and ovaries likely attributable to prior therapy in this vicinity.  Other: Presacral and perirectal stranding/soft tissue density, stable, likely attributable to radiation therapy. No progressive mass in this vicinity. Trace free pelvic fluid.  Musculoskeletal: Degenerative facet arthropathy at L4-5 with grade 1 anterolisthesis, disc bulge, and a central disc protrusion. Mild disc bulge at L3-4.  IMPRESSION: 1. No findings worrisome for recurrence. 2. In addition to the dominant right hepatic lobe cyst, there are a few tiny hypodense lesions in the liver which are stable from last year and highly likely to be benign. 3. Pancreas divisum. 4. 1.7 by 1.2 cm hypodense lesion of the right ovary. Subjective visual assessment is considered "Probably benign Cyst." Accordingly, pelvic ultrasound is recommended. This recommendation follows ACR consensus guidelines: White Paper of the ACR Incidental Findings Committee II on Adnexal Findings. J Am Coll Radiol 2013:10:675-681. 5. Lower lumbar spondylosis and degenerative disc disease. 6. Coronary artery atherosclerosis.    Impression and Plan:  This is a pleasant 59 year old female with the following issues:  1. Stage III colorectal cancer.  She remains disease free more than 5 years out now.  She is status post surgical resection and received adjuvant radiation therapy due to her disease being slightly under  the peritoneal reflection, followed by 6 months of chemotherapy. Her laboratory data and CT scan from 11/01/2014 were discussed today and showed no evidence of any recurrent disease. I see no reason for any further imaging studies regarding her colon cancer. 2. Screening colonoscopy: She follows up routinely with gastroenterology regarding this issue. She will likely be due in 3-4 years. 3. Ovarian cyst: This was picked up on a CT scan incidentally and I do not think is contributing to her intermittent abdominal discomfort. Based on radiology recommendation, I will obtain a ultrasound of the pelvis. I have also recommended she continues with her routine GYN examination by her primary care physician. If her ultrasound is abnormal, I will refer her to GYN for an evaluation.  4. Hypertension: followed by her PCP. Her medications have been adjusted recently.  5. Anemia: Mild at this point I will continue to follow. 6. Follow-up: Will be in 6 months to make sure all her other issues have resolved at this time including the anemia as well as the ovarian cyst.  Zola Button, MD 2/16/20163:57 PM

## 2014-11-08 NOTE — Telephone Encounter (Signed)
Gave avs & calendar for August °

## 2014-11-16 ENCOUNTER — Other Ambulatory Visit: Payer: Self-pay | Admitting: Oncology

## 2014-11-16 ENCOUNTER — Ambulatory Visit (HOSPITAL_COMMUNITY)
Admission: RE | Admit: 2014-11-16 | Discharge: 2014-11-16 | Disposition: A | Discharge status: Home / Self Care | Payer: 59 | Source: Ambulatory Visit | Attending: Oncology | Admitting: Oncology

## 2014-11-16 DIAGNOSIS — C189 Malignant neoplasm of colon, unspecified: Secondary | ICD-10-CM

## 2014-11-16 DIAGNOSIS — N858 Other specified noninflammatory disorders of uterus: Secondary | ICD-10-CM | POA: Insufficient documentation

## 2014-11-16 DIAGNOSIS — N8320 Unspecified ovarian cysts: Secondary | ICD-10-CM | POA: Diagnosis not present

## 2014-11-16 DIAGNOSIS — N8331 Acquired atrophy of ovary: Secondary | ICD-10-CM | POA: Diagnosis not present

## 2014-11-18 ENCOUNTER — Telehealth: Payer: Self-pay | Admitting: *Deleted

## 2014-11-18 NOTE — Telephone Encounter (Signed)
Spoke with patient, per dr Alen Blew, her [place] is fine.

## 2015-02-01 ENCOUNTER — Other Ambulatory Visit (HOSPITAL_COMMUNITY)
Admission: RE | Admit: 2015-02-01 | Discharge: 2015-02-01 | Disposition: A | Discharge status: Home / Self Care | Payer: 59 | Source: Ambulatory Visit | Attending: Physician Assistant | Admitting: Physician Assistant

## 2015-02-01 ENCOUNTER — Other Ambulatory Visit: Payer: Self-pay | Admitting: Physician Assistant

## 2015-02-01 DIAGNOSIS — R87810 Cervical high risk human papillomavirus (HPV) DNA test positive: Secondary | ICD-10-CM | POA: Insufficient documentation

## 2015-02-01 DIAGNOSIS — Z124 Encounter for screening for malignant neoplasm of cervix: Secondary | ICD-10-CM | POA: Insufficient documentation

## 2015-02-01 DIAGNOSIS — Z1151 Encounter for screening for human papillomavirus (HPV): Secondary | ICD-10-CM | POA: Insufficient documentation

## 2015-02-03 LAB — CYTOLOGY - PAP

## 2015-02-28 ENCOUNTER — Other Ambulatory Visit: Payer: Self-pay | Admitting: Obstetrics and Gynecology

## 2015-03-30 ENCOUNTER — Encounter (HOSPITAL_COMMUNITY)
Admission: RE | Admit: 2015-03-30 | Discharge: 2015-03-30 | Disposition: A | Discharge status: Home / Self Care | Payer: 59 | Source: Ambulatory Visit | Attending: Obstetrics and Gynecology | Admitting: Obstetrics and Gynecology

## 2015-03-30 ENCOUNTER — Other Ambulatory Visit: Payer: Self-pay

## 2015-03-30 ENCOUNTER — Encounter (HOSPITAL_COMMUNITY): Payer: Self-pay

## 2015-03-30 DIAGNOSIS — Z01818 Encounter for other preprocedural examination: Secondary | ICD-10-CM | POA: Insufficient documentation

## 2015-03-30 DIAGNOSIS — R87610 Atypical squamous cells of undetermined significance on cytologic smear of cervix (ASC-US): Secondary | ICD-10-CM | POA: Insufficient documentation

## 2015-03-30 LAB — CBC
HEMATOCRIT: 29.3 % — AB (ref 36.0–46.0)
HEMOGLOBIN: 9.4 g/dL — AB (ref 12.0–15.0)
MCH: 25.7 pg — ABNORMAL LOW (ref 26.0–34.0)
MCHC: 32.1 g/dL (ref 30.0–36.0)
MCV: 80.1 fL (ref 78.0–100.0)
PLATELETS: 212 10*3/uL (ref 150–400)
RBC: 3.66 MIL/uL — ABNORMAL LOW (ref 3.87–5.11)
RDW: 16.5 % — AB (ref 11.5–15.5)
WBC: 4.3 10*3/uL (ref 4.0–10.5)

## 2015-03-30 LAB — TYPE AND SCREEN
ABO/RH(D): O POS
Antibody Screen: NEGATIVE

## 2015-03-30 LAB — BASIC METABOLIC PANEL
ANION GAP: 6 (ref 5–15)
BUN: 14 mg/dL (ref 6–20)
CHLORIDE: 107 mmol/L (ref 101–111)
CO2: 27 mmol/L (ref 22–32)
CREATININE: 0.8 mg/dL (ref 0.44–1.00)
Calcium: 8.7 mg/dL — ABNORMAL LOW (ref 8.9–10.3)
GFR calc non Af Amer: >60 mL/min (ref >60)
GLUCOSE: 97 mg/dL (ref 65–99)
Potassium: 3.9 mmol/L (ref 3.5–5.1)
Sodium: 140 mmol/L (ref 135–145)

## 2015-03-30 LAB — ABO/RH: ABO/RH(D): O POS

## 2015-03-30 NOTE — Pre-Procedure Instructions (Signed)
Patient had abnormal EKG at PAT visit. Dr. Royce Macadamia reviewed and no new orders received.

## 2015-03-30 NOTE — Patient Instructions (Signed)
Your procedure is scheduled on:  April 04, 2015    Enter through the Main Entrance of [facility] at:  11;00 am   Pick up the phone at the desk and dial [phone].  Call this number if you have problems the morning of surgery: [phone].  Remember: Do NOT eat food: after midnight on Monday  Do NOT drink clear liquids after:  8:30 am day of surgery    Take these medicines the morning of surgery with a SIP OF WATER: Verapamil, Toprol XL, and Protonix   Do NOT wear jewelry (body piercing), metal hair clips/bobby pins, make-up, or nail polish. Do NOT wear lotions, powders, or perfumes.  You may wear deoderant. Do NOT shave for 48 hours prior to surgery. Do NOT bring valuables to the hospital. Contacts, dentures, or bridgework may not be worn into surgery. Have a responsible adult drive you home and stay with you for 24 hours after your procedure

## 2015-04-03 NOTE — H&P (Signed)
  History of Present Illness  General:  59 year old presents for a preop for cold knife cone biopsy of cervix. H. has a history of an ASCUS Pap with HPV positive. Colposcopy showed CIN-1 on endocervical curettage and more worrisome fragment of higher grade dysplasia grade 2. Patient previously had negative Paps on a regular basis. Last Aleve taken 1 week ago, does not take aspirin regularly. Denies abnormal discharge. We have received medical clearance from Dr. Sabra Heck. History of hypertension well-controlled.   Current Medications  Taking   Metoprolol Succinate 100mg  Tablet Extended Release 24 Hour 1 tablet Once a day   Pantoprazole Sodium 40 MG Tablet Delayed Release 1 tablet twice a day   Sucralfate 1 GM Tablet 1 tablet on an empty stomach four times a day before meals and at bedtime   Verapamil HCl 240MG  ER Capsule Extended Release 24 Hour 1 tablet every morning with food Orally Once a day Once a day    Past Medical History  Esophageal reflux - Dr. Amedeo Plenty  Hypertension  cancer, rectal (Stage 3)- Dr. Alen Blew  knee pain - Dr. Rhona Raider  S/p colostomy 3/09-8/09   Surgical History  colectomy 2009  reversal of colostomy 8\05\09  Colonoscopy 01-20-12   Family History  Father: alive, unknown  Mother: alive  Neg GI Family hx  Only child.   Social History  General:  Tobacco use  cigarettes: Former smoker Quit in year 2000 Pack-year Hx: 15 Tobacco history last updated 03/28/2015 Alcohol: occasionally, wine.  no Caffeine.  no Recreational drug use.  Diet: low fat, low salt.  Exercise: walks occasionally.  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  Children: 2.    Gyn History  Sexual activity currently sexually active.  Periods : postmenopausal.  Denies H/O LMP.  Denies H/O Birth control.  Last pap smear date 02/01/15, ASCUS with + HPV.  Last mammogram date 2015.  Abnormal pap smear assessed with colposcopy, CIN 2 on ECC.    OB History  Number of pregnancies 2.  Pregnancy  # 1 live birth, vaginal delivery.  Pregnancy # 2 live birth, vaginal delivery.    Allergies  N.K.D.A.   Hospitalization/Major Diagnostic Procedure  for surgery   Not in the past year 04/2014   Review of Systems  See HPI Denies fever/chills, chest pain, SOB, headaches, numbness/tingling. No h/o complication with anesthesia, bleeding disorders or blood clots.   Vital Signs  Wt 125, Wt change -1 lb, Ht 65.5, BMI 20.48, Pulse sitting 68, BP sitting 138/80.   Physical Examination  GENERAL:  Patient appears alert and oriented.  General Appearance: well-appearing, well-developed, no acute distress.  Speech: clear.  LUNGS:  Auscultation: no wheezing/rhonchi/rales. CTA bilaterally.  HEART:  Heart sounds: normal. RRR. no murmur.  ABDOMEN:  General: soft nontender, nondistended, no masses.  FEMALE GENITOURINARY:  Pelvic Vulva without lesions,, Cervix normal, Uterus normal, Uterus normal, vagina-no discharge.  EXTREMITIES:  General: No edema or calf tenderness.     Assessments   1. Pre-operative clearance - Z01.818 (Primary)   2. CIN II (cervical intraepithelial neoplasia II) - N87.1, Cervical dysplasia on endocervix. Include inconclusive sampling on endocervical curettage.   Treatment  1. Pre-operative clearance  Notes: Recommend CKC over LEEP for definitive diagnosis. Patient counts counseled on risk and benefits of each. Patient agreeable to proceed with the surgery. Risk of bleeding infection reviewed at length.    Follow Up  2 Weeks post op

## 2015-04-03 NOTE — Anesthesia Preprocedure Evaluation (Addendum)
Anesthesia Evaluation  Patient identified by MRN, date of birth, ID band Patient awake    Reviewed: Allergy & Precautions, NPO status , Patient's Chart, lab work & pertinent test results  History of Anesthesia Complications Negative for: history of anesthetic complications  Airway Mallampati: II  TM Distance: >3 FB Neck ROM: Full    Dental no notable dental hx. (+) Dental Advisory Given   Pulmonary former smoker,  breath sounds clear to auscultation  Pulmonary exam normal       Cardiovascular hypertension, Pt. on medications Normal cardiovascular examRhythm:Regular Rate:Normal     Neuro/Psych negative neurological ROS  negative psych ROS   GI/Hepatic Neg liver ROS, GERD-  Medicated and Controlled,  Endo/Other  negative endocrine ROS  Renal/GU negative Renal ROS  negative genitourinary   Musculoskeletal negative musculoskeletal ROS (+)   Abdominal   Peds negative pediatric ROS (+)  Hematology negative hematology ROS (+)   Anesthesia Other Findings   Reproductive/Obstetrics negative OB ROS                           Anesthesia Physical Anesthesia Plan  ASA: II  Anesthesia Plan: General   Post-op Pain Management:    Induction: Intravenous  Airway Management Planned: LMA  Additional Equipment:   Intra-op Plan:   Post-operative Plan:   Informed Consent: I have reviewed the patients History and Physical, chart, labs and discussed the procedure including the risks, benefits and alternatives for the proposed anesthesia with the patient or authorized representative who has indicated his/her understanding and acceptance.   Dental advisory given  Plan Discussed with: CRNA  Anesthesia Plan Comments:         Anesthesia Quick Evaluation

## 2015-04-04 ENCOUNTER — Ambulatory Visit (HOSPITAL_COMMUNITY)
Admission: RE | Admit: 2015-04-04 | Discharge: 2015-04-04 | Disposition: A | Discharge status: Home / Self Care | Payer: 59 | Source: Ambulatory Visit | Attending: Obstetrics and Gynecology | Admitting: Obstetrics and Gynecology

## 2015-04-04 ENCOUNTER — Ambulatory Visit (HOSPITAL_COMMUNITY): Payer: 59 | Admitting: Anesthesiology

## 2015-04-04 ENCOUNTER — Encounter (HOSPITAL_COMMUNITY): Payer: Self-pay | Admitting: *Deleted

## 2015-04-04 ENCOUNTER — Encounter (HOSPITAL_COMMUNITY)
Admission: RE | Disposition: A | Discharge status: Home / Self Care | Payer: Self-pay | Source: Ambulatory Visit | Attending: Obstetrics and Gynecology

## 2015-04-04 DIAGNOSIS — N882 Stricture and stenosis of cervix uteri: Secondary | ICD-10-CM | POA: Insufficient documentation

## 2015-04-04 DIAGNOSIS — Z85048 Personal history of other malignant neoplasm of rectum, rectosigmoid junction, and anus: Secondary | ICD-10-CM | POA: Insufficient documentation

## 2015-04-04 DIAGNOSIS — I10 Essential (primary) hypertension: Secondary | ICD-10-CM | POA: Diagnosis not present

## 2015-04-04 DIAGNOSIS — Z87891 Personal history of nicotine dependence: Secondary | ICD-10-CM | POA: Diagnosis not present

## 2015-04-04 DIAGNOSIS — K219 Gastro-esophageal reflux disease without esophagitis: Secondary | ICD-10-CM | POA: Diagnosis not present

## 2015-04-04 DIAGNOSIS — N871 Moderate cervical dysplasia: Secondary | ICD-10-CM | POA: Insufficient documentation

## 2015-04-04 DIAGNOSIS — N870 Mild cervical dysplasia: Secondary | ICD-10-CM | POA: Diagnosis present

## 2015-04-04 HISTORY — PX: CERVICAL CONIZATION W/BX: SHX1330

## 2015-04-04 LAB — TYPE AND SCREEN
ABO/RH(D): O POS
Antibody Screen: NEGATIVE

## 2015-04-04 SURGERY — CONE BIOPSY, CERVIX
Anesthesia: General | Site: Vagina

## 2015-04-04 MED ORDER — IBUPROFEN 600 MG PO TABS: 600.0000 mg | ORAL_TABLET | Freq: Four times a day (QID) | ORAL | Status: DC | PRN

## 2015-04-04 MED ORDER — DEXAMETHASONE SODIUM PHOSPHATE 10 MG/ML IJ SOLN
INTRAMUSCULAR | Status: DC | PRN
  Administered 2015-04-04: 4 mg via INTRAVENOUS

## 2015-04-04 MED ORDER — SCOPOLAMINE 1 MG/3DAYS TD PT72: 1.0000 | MEDICATED_PATCH | Freq: Once | TRANSDERMAL | Status: DC

## 2015-04-04 MED ORDER — KETOROLAC TROMETHAMINE 30 MG/ML IJ SOLN
INTRAMUSCULAR | Status: DC | PRN
  Administered 2015-04-04: 30 mg via INTRAVENOUS

## 2015-04-04 MED ORDER — MIDAZOLAM HCL 2 MG/2ML IJ SOLN
INTRAMUSCULAR | Status: DC | PRN
  Administered 2015-04-04 (×2): 1 mg via INTRAVENOUS

## 2015-04-04 MED ORDER — ONDANSETRON HCL 4 MG/2ML IJ SOLN
INTRAMUSCULAR | Status: AC
  Filled 2015-04-04: qty 2

## 2015-04-04 MED ORDER — ACETIC ACID 4% SOLUTION
Status: DC | PRN
  Administered 2015-04-04: 2 via TOPICAL

## 2015-04-04 MED ORDER — FENTANYL CITRATE (PF) 100 MCG/2ML IJ SOLN: 25.0000 ug | INTRAMUSCULAR | Status: DC | PRN

## 2015-04-04 MED ORDER — SCOPOLAMINE 1 MG/3DAYS TD PT72
1.0000 | MEDICATED_PATCH | TRANSDERMAL | Status: DC
  Administered 2015-04-04: 1.5 mg via TRANSDERMAL

## 2015-04-04 MED ORDER — ACETIC ACID 5 % SOLN
Status: AC
  Filled 2015-04-04: qty 500

## 2015-04-04 MED ORDER — SCOPOLAMINE 1 MG/3DAYS TD PT72
MEDICATED_PATCH | TRANSDERMAL | Status: AC
  Administered 2015-04-04: 1.5 mg via TRANSDERMAL
  Filled 2015-04-04: qty 1

## 2015-04-04 MED ORDER — LIDOCAINE-EPINEPHRINE 1 %-1:100000 IJ SOLN
INTRAMUSCULAR | Status: AC
  Filled 2015-04-04: qty 1

## 2015-04-04 MED ORDER — PROPOFOL 10 MG/ML IV BOLUS
INTRAVENOUS | Status: AC
  Filled 2015-04-04: qty 20

## 2015-04-04 MED ORDER — LACTATED RINGERS IV SOLN
INTRAVENOUS | Status: DC
  Administered 2015-04-04: 11:00:00 via INTRAVENOUS

## 2015-04-04 MED ORDER — ONDANSETRON HCL 4 MG/2ML IJ SOLN: 4.0000 mg | Freq: Once | INTRAMUSCULAR | Status: DC | PRN

## 2015-04-04 MED ORDER — PROPOFOL 10 MG/ML IV BOLUS
INTRAVENOUS | Status: DC | PRN
  Administered 2015-04-04: 150 mg via INTRAVENOUS

## 2015-04-04 MED ORDER — OXYCODONE-ACETAMINOPHEN 5-325 MG PO TABS
2.0000 | ORAL_TABLET | ORAL | Status: DC | PRN
Start: 2015-04-04 — End: 2015-05-09

## 2015-04-04 MED ORDER — LIDOCAINE-EPINEPHRINE 1 %-1:100000 IJ SOLN
INTRAMUSCULAR | Status: DC | PRN
  Administered 2015-04-04: 10 mL

## 2015-04-04 MED ORDER — MIDAZOLAM HCL 2 MG/2ML IJ SOLN
INTRAMUSCULAR | Status: AC
  Filled 2015-04-04: qty 2

## 2015-04-04 MED ORDER — LIDOCAINE HCL (CARDIAC) 20 MG/ML IV SOLN
INTRAVENOUS | Status: AC
  Filled 2015-04-04: qty 5

## 2015-04-04 MED ORDER — ONDANSETRON HCL 4 MG/2ML IJ SOLN
INTRAMUSCULAR | Status: DC | PRN
  Administered 2015-04-04: 4 mg via INTRAVENOUS

## 2015-04-04 MED ORDER — FERRIC SUBSULFATE 259 MG/GM EX SOLN
CUTANEOUS | Status: AC
Start: 2015-04-04 — End: 2015-04-04
  Filled 2015-04-04: qty 8

## 2015-04-04 MED ORDER — FENTANYL CITRATE (PF) 100 MCG/2ML IJ SOLN
INTRAMUSCULAR | Status: DC | PRN
  Administered 2015-04-04 (×2): 50 ug via INTRAVENOUS

## 2015-04-04 MED ORDER — CEFAZOLIN SODIUM-DEXTROSE 2-3 GM-% IV SOLR
2.0000 g | INTRAVENOUS | Status: AC
  Administered 2015-04-04: 2 g via INTRAVENOUS

## 2015-04-04 MED ORDER — LIDOCAINE HCL (CARDIAC) 20 MG/ML IV SOLN
INTRAVENOUS | Status: DC | PRN
  Administered 2015-04-04: 50 mg via INTRAVENOUS

## 2015-04-04 MED ORDER — IODINE STRONG (LUGOLS) 5 % PO SOLN
ORAL | Status: DC | PRN
  Administered 2015-04-04: 0.1 mL

## 2015-04-04 MED ORDER — CEFAZOLIN SODIUM-DEXTROSE 2-3 GM-% IV SOLR
INTRAVENOUS | Status: AC
  Filled 2015-04-04: qty 50

## 2015-04-04 MED ORDER — MICROFIBRILLAR COLL HEMOSTAT EX POWD
CUTANEOUS | Status: AC
  Filled 2015-04-04: qty 5

## 2015-04-04 MED ORDER — MICROFIBRILLAR COLL HEMOSTAT EX PADS
MEDICATED_PAD | CUTANEOUS | Status: DC | PRN
  Administered 2015-04-04: 1 via TOPICAL

## 2015-04-04 MED ORDER — FENTANYL CITRATE (PF) 100 MCG/2ML IJ SOLN
INTRAMUSCULAR | Status: AC
  Filled 2015-04-04: qty 2

## 2015-04-04 MED ORDER — KETOROLAC TROMETHAMINE 30 MG/ML IJ SOLN
INTRAMUSCULAR | Status: AC
  Filled 2015-04-04: qty 1

## 2015-04-04 MED ORDER — DEXAMETHASONE SODIUM PHOSPHATE 4 MG/ML IJ SOLN
INTRAMUSCULAR | Status: AC
  Filled 2015-04-04: qty 1

## 2015-04-04 SURGICAL SUPPLY — 29 items
APPLICATOR COTTON TIP 6IN STRL (MISCELLANEOUS) ×2 IMPLANT
BLADE SURG 11 STRL SS (BLADE) ×3 IMPLANT
CATH ROBINSON RED A/P 16FR (CATHETERS) ×2 IMPLANT
CLOTH BEACON ORANGE TIMEOUT ST (SAFETY) ×2 IMPLANT
CONTAINER PREFILL 10% NBF 60ML (FORM) IMPLANT
COUNTER NEEDLE 1200 MAGNETIC (NEEDLE) IMPLANT
ELECT BALL LEEP 5MM RED (ELECTRODE) ×1 IMPLANT
ELECT REM PT RETURN 9FT ADLT (ELECTROSURGICAL) ×2
ELECTRODE REM PT RTRN 9FT ADLT (ELECTROSURGICAL) ×1 IMPLANT
GLOVE BIO SURGEON STRL SZ7 (GLOVE) ×2 IMPLANT
GLOVE BIOGEL PI IND STRL 7.0 (GLOVE) ×1 IMPLANT
GLOVE BIOGEL PI INDICATOR 7.0 (GLOVE) ×1
GOWN STRL REUS W/TWL LRG LVL3 (GOWN DISPOSABLE) ×4 IMPLANT
HEMOSTAT SURGICEL 2X3 (HEMOSTASIS) IMPLANT
NS IRRIG 1000ML POUR BTL (IV SOLUTION) ×2 IMPLANT
PACK VAGINAL MINOR WOMEN LF (CUSTOM PROCEDURE TRAY) ×2 IMPLANT
PAD OB MATERNITY 4.3X12.25 (PERSONAL CARE ITEMS) ×2 IMPLANT
PENCIL BUTTON HOLSTER BLD 10FT (ELECTRODE) ×2 IMPLANT
SCOPETTES 8  STERILE (MISCELLANEOUS) ×2
SCOPETTES 8 STERILE (MISCELLANEOUS) ×2 IMPLANT
SPONGE SURGIFOAM ABS GEL 12-7 (HEMOSTASIS) IMPLANT
SUT VIC AB 0 CT1 27 (SUTURE) ×4
SUT VIC AB 0 CT1 27XBRD ANBCTR (SUTURE) ×2 IMPLANT
SUT VIC AB 3-0 CT1 27 (SUTURE) ×2
SUT VIC AB 3-0 CT1 TAPERPNT 27 (SUTURE) ×1 IMPLANT
TOWEL OR 17X24 6PK STRL BLUE (TOWEL DISPOSABLE) ×4 IMPLANT
TUBING NON-CON 1/4 X 20 CONN (TUBING) ×2 IMPLANT
WATER STERILE IRR 1000ML POUR (IV SOLUTION) ×2 IMPLANT
YANKAUER SUCT BULB TIP NO VENT (SUCTIONS) ×2 IMPLANT

## 2015-04-04 NOTE — Anesthesia Procedure Notes (Signed)
Procedure Name: LMA Insertion Date/Time: 04/04/2015 1:57 PM Performed by: Jonna Munro Pre-anesthesia Checklist: Patient identified, Emergency Drugs available, Suction available, Patient being monitored and Timeout performed Patient Re-evaluated:Patient Re-evaluated prior to inductionOxygen Delivery Method: Circle system utilized Preoxygenation: Pre-oxygenation with 100% oxygen Intubation Type: IV induction LMA: LMA inserted LMA Size: 4.0 Number of attempts: 1 Placement Confirmation: positive ETCO2 and breath sounds checked- equal and bilateral Dental Injury: Teeth and Oropharynx as per pre-operative assessment

## 2015-04-04 NOTE — Anesthesia Postprocedure Evaluation (Signed)
  Anesthesia Post-op Note  Patient: Tiffany Walsh  Procedure(s) Performed: Procedure(s): CONIZATION CERVIX WITH BIOPSY (N/A)  Patient Location: PACU  Anesthesia Type:General  Level of Consciousness: awake and alert   Airway and Oxygen Therapy: Patient Spontanous Breathing  Post-op Pain: none  Post-op Assessment: Post-op Vital signs reviewed, Patient's Cardiovascular Status Stable, Respiratory Function Stable, Patent Airway and No signs of Nausea or vomiting              Post-op Vital Signs: Reviewed and stable  Last Vitals:  Filed Vitals:   04/04/15 1600  BP: 126/69  Pulse: 58  Temp: 36.8 C  Resp: 12    Complications: No apparent anesthesia complications

## 2015-04-04 NOTE — Brief Op Note (Signed)
04/04/2015  3:05 PM  PATIENT:  Tiffany Walsh  59 y.o. female  PRE-OPERATIVE DIAGNOSIS:  R87.610 Cervical Dysplasia  POST-OPERATIVE DIAGNOSIS:  R87.610 cervical dysplasia  PROCEDURE:  Procedure(s): CONIZATION CERVIX WITH BIOPSY (N/A), ECC, EMB  SURGEON:  Surgeon(s) and Role:    * Thurnell Lose, MD - Primary  PHYSICIAN ASSISTANT: None  ASSISTANTS: Technician   ANESTHESIA:   local and general  EBL:  Total I/O In: 1400 [I.V.:1400] Out: 110 [Urine:100; Blood:10]  BLOOD ADMINISTERED:none  DRAINS: none   LOCAL MEDICATIONS USED:  1% LIDOCAINE w/ 1/100,000 epinephrine  SPECIMEN:  Source of Specimen:  Cone biopsy of cervix, ECC, EMB  DISPOSITION OF SPECIMEN:  PATHOLOGY  COUNTS:  YES  TOURNIQUET:  * No tourniquets in log *  DICTATION: .Other Dictation: Dictation Number [id number]  PLAN OF CARE: Discharge to home after PACU  PATIENT DISPOSITION:  PACU - hemodynamically stable.   Delay start of Pharmacological VTE agent (>24hrs) due to surgical blood loss or risk of bleeding: yes

## 2015-04-04 NOTE — Interval H&P Note (Signed)
History and Physical Interval Note:  04/04/2015 12:14 PM  Tiffany Walsh  has presented today for surgery, with the diagnosis of R87.610 Cervical Dysplasia  The various methods of treatment have been discussed with the patient and family. After consideration of risks, benefits and other options for treatment, the patient has consented to  Procedure(s): CONIZATION CERVIX WITH BIOPSY (N/A) as a surgical intervention .  The patient's history has been reviewed, patient examined, no change in status, stable for surgery.  I have reviewed the patient's chart and labs.  Questions were answered to the patient's satisfaction.     Simona Huh, Christyna Letendre

## 2015-04-04 NOTE — Transfer of Care (Signed)
Immediate Anesthesia Transfer of Care Note  Patient: Tiffany Walsh  Procedure(s) Performed: Procedure(s): CONIZATION CERVIX WITH BIOPSY (N/A)  Patient Location: PACU  Anesthesia Type:General  Level of Consciousness: awake, alert  and oriented  Airway & Oxygen Therapy: Patient Spontanous Breathing and Patient connected to nasal cannula oxygen  Post-op Assessment: Report given to RN and Post -op Vital signs reviewed and stable  Post vital signs: Reviewed and stable  Last Vitals:  Filed Vitals:   04/04/15 1058  BP: 150/77  Pulse: 49  Temp: 36.7 C  Resp: 16    Complications: No apparent anesthesia complications

## 2015-04-04 NOTE — Discharge Instructions (Signed)
Conization of the Cervix  Cervical conization is the cutting (excision) of a cone-shaped portion of the cervix. The procedure is performed through the vagina in either your health care provider's office or an operating room. This procedure is usually done when there is abnormal bleeding from the cervix. It can also be done to evaluate an abnormal Pap test or if an abnormality is seen on the cervix during an exam. The tissue is then examined to see if there are precancerous cells or cancer present.   Conization of the cervix is not done during a menstrual period or pregnancy.   LET YOUR HEALTH CARE PROVIDER KNOW ABOUT:  · Any allergies you have.    · All medicines you are taking, including vitamins, herbs, eye drops, creams, and over-the-counter medicines.    · Previous problems you or members of your family have had with the use of anesthetics.    · Any blood disorders you have.    · Previous surgeries you have had.    · Medical conditions you have.    · Your smoking habits.    · The possibility of being pregnant.    RISKS AND COMPLICATIONS   Generally, conization of the cervix is a safe procedure. However, as with any procedure, complications can occur. Possible complications include:  · Heavy bleeding several days or weeks after the procedure. Light bleeding or spotting after the procedure is normal.  · Infection (rare).  · Damage to the cervix or surrounding organs (uncommon).    · Problems with the anesthesia.    · Increased risk of preterm labor in future pregnancies.  BEFORE THE PROCEDURE  · Do not eat or drink anything for 6-8 hours before the procedure.    · Do not take aspirin or blood thinners for at least a week before the procedure or as directed by your health care provider.    · Arrange for someone to take you home after the procedure.    PROCEDURE  There are three different methods to perform conization of the cervix. These include:   · The cold knife method. In this method a small cone-shaped sample  of tissue is cut out with a knife (scalpel) from the cervical canal and the transformation zone (where the normal cells end and the abnormal cells begin).    · The LEEP method. In this method a small cone-shaped sample of tissue is cut out with a thin wire that can burn (cauterize) the cervical tissue with an electrical current.    · Laser treatment. In this method a small cone-shaped sample of tissue is cut out and then cauterized with a laser beam to prevent bleeding.    The procedure will be performed as follows:   · Depending on the method, you will either be given a medicine to make you sleep (general anesthetic) or a numbing medicine (local anesthetic). A medicine that numbs the cervix (cervical block) may be given.    · A lubricated device called a speculum will be inserted into the vagina to spread open the walls of the vagina. This will help your health care provider see the inside of the vagina and cervix better.    · The tissue from the cervix will be removed and examined.    · The results of the procedure will help your health care provider decide if further treatment is necessary. They will also help your health care provider decide on the best treatment if your results are abnormal.  AFTER THE PROCEDURE  · If you had a general anesthetic, you may be groggy for 2-3 hours after   to 3 weeks.   You may have some cramping for about 1 week.   You may have bloody discharge or light bleeding for 1-2 weeks.   You may have black discharge coming from the vagina. This is from the paste used on the cervix to prevent bleeding. This is normal discharge.  Document Released: 06/19/2005 Document Revised: 09/14/2013 Document Reviewed: 03/05/2013 [employer] Patient Information 2015 [employer]. This information is not intended to replace advice  given to you by your health care provider. Make sure you discuss any questions you have with your health care provider.  

## 2015-04-05 ENCOUNTER — Encounter (HOSPITAL_COMMUNITY): Payer: Self-pay | Admitting: Obstetrics and Gynecology

## 2015-04-06 NOTE — Op Note (Signed)
Tiffany Walsh, Tiffany Walsh              ACCOUNT NO.:  [id number]  MEDICAL RECORD NO.:  [id number]  LOCATION:  [facility]                          FACILITY:  [facility]  PHYSICIAN:  Jola Schmidt, MD   DATE OF BIRTH:  1955-10-25  DATE OF PROCEDURE:  04/04/2015 DATE OF DISCHARGE:  04/04/2015                              OPERATIVE REPORT   PREOPERATIVE DIAGNOSIS:  Cervical dysplasia (endocervical canal).  POSTOPERATIVE DIAGNOSIS:  Cervical dysplasia (endocervical canal).  PROCEDURE:  Conization of cervix with biopsy, endocervical curettage, and endometrial biopsy.  SURGEON:  Jola Schmidt, MD.  PHYSICIAN ASSISTANT:  None.  ASSISTANT:  [employer].  ANESTHESIA:  Local and general.  ESTIMATED BLOOD LOSS:  10.  LOCAL:  1% lidocaine with 1:100,000 epinephrine.  SPECIMENS:  Cone biopsy of cervix, endocervical curettage, and endometrial biopsy.  Specimen to Pathology.  DISPOSITION:  The patient dispositioned to PACU, hemodynamically stable.  FINDINGS:  Normal-appearing cervix, endocervical canal was stenotic.  No vaginal lining.  No lesions.  Minimal bleeding.  COMPLICATIONS:  None.  PROCEDURE IN DETAIL:  [name] is a 59 year old female with no extensive history of abnormal Pap smears.  She had an ASCUS HPV positive Pap.  Colposcopy showed that cervical lesions were negative for dysplasia, but the endocervical canal had type 1 dysplasia and cells that were worrisome for more abnormal dysplasia in the endocervical canal, high-grade dysplasia.  The patient was counseled on cold knife cone biopsy of the cervix and consented.  The patient was taken from the holding area.  Underwent general endotracheal anesthesia without complication.  She was placed in the dorsal lithotomy position and prepped and draped in a normal sterile fashion after time-out was taken.  Her vagina was prepped.  The cervix was visualized once a weighted speculum and a narrow Deaver were placed in the vagina.  I  injected the lidocaine at the anterior lip of the cervix to grasp with a single-tooth tenaculum.  Stay sutures were applied with 0 Vicryl suture at 9 and 3 o'clock.  Those were held with the hemostat.  I then went around, Lugol was then used for any demarcation.  I then made a circumferential incision on the cervix with an 11 blade scalpel.  Then, I used the angled scalpel to make a cone portion of the cervix.  I then transected the stump with the curved Mayo.  Prior to doing that, I marked the 12 o'clock flap with a 3-0 Vicryl suture.  Once the cone portion of the cervix was removed, I performed an endocervical curettage.  There was only probably about less than 1 cm of endocervix remaining.  I then attempted to do an EMB, again it was stenotic, but I got in about 3 cm into the lower uterine segment.  I did not get a lot back on the endometrial biopsy.  There was minimal bleeding.  I did use the roller ball for cautery and then placed Avitene in the cone bed.  Sutures were cut.  All instruments were removed from the vagina.  The patient tolerated the procedure well.  Instrument, sponge, and needle counts were correct x3.  The patient was given Ancef prior to the procedure.  I  did use lidocaine with epi circumferentially and the bed of the cervix before removing it and again she had minimal bleeding.     Jola Schmidt, MD     EBV/MEDQ  D:  04/06/2015  T:  04/06/2015  Job:  [id number]

## 2015-05-09 ENCOUNTER — Telehealth: Payer: Self-pay | Admitting: Oncology

## 2015-05-09 ENCOUNTER — Other Ambulatory Visit (HOSPITAL_BASED_OUTPATIENT_CLINIC_OR_DEPARTMENT_OTHER): Payer: 59

## 2015-05-09 ENCOUNTER — Telehealth: Payer: Self-pay | Admitting: *Deleted

## 2015-05-09 ENCOUNTER — Ambulatory Visit (HOSPITAL_BASED_OUTPATIENT_CLINIC_OR_DEPARTMENT_OTHER): Payer: 59 | Admitting: Oncology

## 2015-05-09 VITALS — BP 142/91 | HR 67 | Temp 97.9°F | Resp 20 | Ht 65.5 in | Wt 125.6 lb

## 2015-05-09 DIAGNOSIS — C189 Malignant neoplasm of colon, unspecified: Secondary | ICD-10-CM

## 2015-05-09 DIAGNOSIS — Z85048 Personal history of other malignant neoplasm of rectum, rectosigmoid junction, and anus: Secondary | ICD-10-CM

## 2015-05-09 DIAGNOSIS — D509 Iron deficiency anemia, unspecified: Secondary | ICD-10-CM

## 2015-05-09 LAB — CBC WITH DIFFERENTIAL/PLATELET
BASO%: 0.8 % (ref 0.0–2.0)
BASOS ABS: 0 10*3/uL (ref 0.0–0.1)
EOS%: 1.8 % (ref 0.0–7.0)
Eosinophils Absolute: 0.1 10*3/uL (ref 0.0–0.5)
HCT: 29 % — ABNORMAL LOW (ref 34.8–46.6)
HGB: 9.1 g/dL — ABNORMAL LOW (ref 11.6–15.9)
LYMPH%: 24.6 % (ref 14.0–49.7)
MCH: 24.8 pg — AB (ref 25.1–34.0)
MCHC: 31.3 g/dL — AB (ref 31.5–36.0)
MCV: 79.1 fL — ABNORMAL LOW (ref 79.5–101.0)
MONO#: 0.3 10*3/uL (ref 0.1–0.9)
MONO%: 8.3 % (ref 0.0–14.0)
NEUT#: 2.6 10*3/uL (ref 1.5–6.5)
NEUT%: 64.5 % (ref 38.4–76.8)
PLATELETS: 194 10*3/uL (ref 145–400)
RBC: 3.67 10*6/uL — AB (ref 3.70–5.45)
RDW: 17.1 % — ABNORMAL HIGH (ref 11.2–14.5)
WBC: 4.1 10*3/uL (ref 3.9–10.3)
lymph#: 1 10*3/uL (ref 0.9–3.3)

## 2015-05-09 LAB — COMPREHENSIVE METABOLIC PANEL (CC13)
ALT: 10 U/L (ref 0–55)
ANION GAP: 9 meq/L (ref 3–11)
AST: 13 U/L (ref 5–34)
Albumin: 3.7 g/dL (ref 3.5–5.0)
Alkaline Phosphatase: 76 U/L (ref 40–150)
BUN: 12.6 mg/dL (ref 7.0–26.0)
CHLORIDE: 109 meq/L (ref 98–109)
CO2: 26 meq/L (ref 22–29)
CREATININE: 0.8 mg/dL (ref 0.6–1.1)
Calcium: 8.5 mg/dL (ref 8.4–10.4)
EGFR: >90 mL/min/{1.73_m2} (ref >90)
Glucose: 92 mg/dl (ref 70–140)
POTASSIUM: 3.9 meq/L (ref 3.5–5.1)
Sodium: 144 mEq/L (ref 136–145)
Total Bilirubin: 0.27 mg/dL (ref 0.20–1.20)
Total Protein: 6.9 g/dL (ref 6.4–8.3)

## 2015-05-09 MED ORDER — FERROUS SULFATE 325 (65 FE) MG PO TBEC
DELAYED_RELEASE_TABLET | ORAL | Status: DC
Start: 2015-05-09 — End: 2019-05-27

## 2015-05-09 NOTE — Telephone Encounter (Signed)
-----   Message from Wyatt Portela, MD sent at 05/09/2015 10:06 AM EDT ----- Please call her to pick up iron pills from her pharmacy. Already prescribed.   Thanks.

## 2015-05-09 NOTE — Telephone Encounter (Signed)
per pof to sch pt appt-gave pt copy of avs °

## 2015-05-09 NOTE — Telephone Encounter (Signed)
Called patient, dr Alen Blew e-scribed iron pills to her pharmacy. She will pick up today.

## 2015-05-09 NOTE — Progress Notes (Signed)
Hematology and Oncology Follow Up Visit  Tiffany Walsh [id number] 12/16/1955 59 y.o. 05/09/2015 9:54 AM  CC: [name], MD  [name], M.D.  [name], M.D.  [name], M.D.    Principle Diagnosis:  A 59 year old female diagnosed with a T3 N1, moderately differentiated adenocarcinoma of the upper rectum diagnosed in March 2009. She also has an element of microcytosis and possibly iron deficiency anemia.  Prior Therapy: 1. Status post laparoscopic low anterior resection.  Tumor was T3 N1 with 3 out of 19 lymph nodes involved.  Operation was done in March 2009.  2. The patient received radiation therapy concomitantly with Xeloda.  Therapy concluded on February 22, 2008.  3. She is status post ostomy takedown in August 2009.  4. She received 6 months of therapy adjuvantly, receiving oxaliplatin and Xeloda therapy, concluded in December 2009.  Current therapy: Observation and surveillance from a colon cancer standpoint. Oral iron replacement for iron deficiency anemia.  Interim History:  Tiffany Walsh presents today for a followup visit. Since the last visit, she continues to do well. She underwent GYN examination and found to have abnormal Pap smear. She underwent a conization procedure on 04/04/2015 for cervical dysplasia. The final pathology did not reveal any malignancy. She tolerated the procedure well and have not had any other complaints.   She is no longer reporting abdominal pain or pelvic pain. She does report some occasional spotting but no bleeding or clots. She has not reported any hematochezia but does report of bleeding hemorrhoids at times. She has not reported any epistaxis or hemoptysis. She continues to work full time without any decline. She has not reported any chest pain or difficulty breathing. Did not report any exertional dyspnea. Did not report any ice craving.   She is not reporting any nausea or vomiting.  Had not reported any abdominal distention.  She  does report some occasional diarrhea.  Had not reported any recurrent neurological symptoms or residual neuropathy.  She has very faint sensory neuropathy in her lower extremity, but had not changed.  She has continued to work full-time and have excellent quality of life and performance status. She has not reported any weight loss or appetite changes. She does not report any headaches or blurry vision or syncope. She does not report any constitutional symptoms. Rest of her review of systems unremarkable.  Medications: I have reviewed the patient's current medications.   Allergies: No Known Allergies   Physical Exam: Blood pressure 142/91, pulse 67, temperature 97.9 F (36.6 C), temperature source Oral, resp. rate 20, height 5' 5.5" (1.664 m), weight 125 lb 9.6 oz (56.972 kg), SpO2 100 %. ECOG: 0 General appearance: alert , awake healthy appearing woman without any distress. Head: Normocephalic, without obvious abnormality Neck: no adenopathy Lymph nodes: Cervical, supraclavicular, and axillary nodes normal. Heart:regular rate and rhythm, S1, S2 normal, no murmur, click, rub or gallop Lung:chest clear, no wheezing, rales, normal symmetric air entry Abdomin: soft, non-tender, without masses or organomegaly no ascites or shifting dullness. EXT:no erythema, induration, or nodules   Lab Results: Lab Results  Component Value Date   WBC 4.1 05/09/2015   HGB 9.1* 05/09/2015   HCT 29.0* 05/09/2015   MCV 79.1* 05/09/2015   PLT 194 05/09/2015   EXAM: TRANSABDOMINAL AND TRANSVAGINAL ULTRASOUND OF PELVIS  TECHNIQUE: Both transabdominal and transvaginal ultrasound examinations of the pelvis were performed. Transabdominal technique was performed for global imaging of the pelvis including uterus, ovaries, adnexal regions, and  pelvic cul-de-sac. It was necessary to proceed with endovaginal exam following the transabdominal exam to visualize the endometrium and ovaries.  COMPARISON: CT  abdomen and pelvis 11/01/2014  FINDINGS: Uterus  Measurements: 3.9 x 2.3 x 2.8 cm. Atrophic appearance. No discrete mass. Heterogeneous myometrial echogenicity.  Endometrium  Thickness: 2 mm thick, normal. No endometrial fluid. Single tiny hyperechoic focus at the endometrial complex at the upper uterine segment 3 mm diameter, nonspecific.  Right ovary  Measurements: 1.9 x 1.6 x 2.1 cm. Small cyst 16 x 14 x 17 mm in RIGHT ovary, containing simple features.  Left ovary  Measurements: 1.2 x 0.9 x 1.1 cm. Atrophic without focal abnormality.  Other findings  Small amount of free pelvic fluid in cul de sac and anteriorly.  IMPRESSION: Atrophic uterus and LEFT ovary.  17 mm RIGHT ovarian cyst with simple features.  Impression and Plan:  This is a pleasant 59 year old female with the following issues:  1. Stage III colorectal cancer:  She remains disease free without any evidence of recurrence..  She is status post surgical resection and received adjuvant radiation therapy due to her disease being slightly under the peritoneal reflection, followed by 6 months of chemotherapy. Her laboratory data and CT scan from 02/09/201 showed no evidence of any recurrent disease. I see no reason for any further imaging studies unless she develops any symptoms in the future.  2. Screening colonoscopy: She follows up routinely with gastroenterology regarding this issue. She will be due next year. I continue to encourage her to keep up with screening colonoscopies.  3.Ovarian cyst: Ultrasound done in February 2016 was reviewed and showed predominantly simple cystic features. She continue to follow-up with her GYN regarding this issue.  4. Abnormal Pap smear: She status post conization with final pathology revealing no evidence of malignancy.  5. Microcytosis and anemia: Her hemoglobin has drifted down to 9.1 with MCV of 79 and RDW of 17.1. She has element of iron deficiency anemia.  She is up-to-date on her GI workup and she has no other signs or symptoms of bleeding. I will start her on iron replacement and recheck your iron stores in 4 months.  6. Hypertension: followed by her PCP.   7. Follow-up: Will be in 4 months to follow-up on her iron deficiency.  [facility], MD 8/16/20169:54 AM

## 2015-09-12 ENCOUNTER — Ambulatory Visit: Payer: 59 | Admitting: Oncology

## 2015-09-12 ENCOUNTER — Other Ambulatory Visit: Payer: 59

## 2015-11-08 ENCOUNTER — Other Ambulatory Visit (HOSPITAL_COMMUNITY)
Admission: RE | Admit: 2015-11-08 | Discharge: 2015-11-08 | Disposition: A | Discharge status: Home / Self Care | Payer: BLUE CROSS/BLUE SHIELD | Source: Ambulatory Visit | Attending: Obstetrics and Gynecology | Admitting: Obstetrics and Gynecology

## 2015-11-08 ENCOUNTER — Other Ambulatory Visit: Payer: Self-pay | Admitting: Obstetrics and Gynecology

## 2015-11-08 DIAGNOSIS — Z1151 Encounter for screening for human papillomavirus (HPV): Secondary | ICD-10-CM | POA: Diagnosis not present

## 2015-11-08 DIAGNOSIS — Z01411 Encounter for gynecological examination (general) (routine) with abnormal findings: Secondary | ICD-10-CM | POA: Insufficient documentation

## 2015-11-09 LAB — CYTOLOGY - PAP

## 2016-02-06 DIAGNOSIS — E7800 Pure hypercholesterolemia, unspecified: Secondary | ICD-10-CM | POA: Diagnosis not present

## 2016-02-06 DIAGNOSIS — I10 Essential (primary) hypertension: Secondary | ICD-10-CM | POA: Diagnosis not present

## 2016-02-06 DIAGNOSIS — G620 Drug-induced polyneuropathy: Secondary | ICD-10-CM | POA: Diagnosis not present

## 2016-02-06 DIAGNOSIS — Z23 Encounter for immunization: Secondary | ICD-10-CM | POA: Diagnosis not present

## 2016-02-06 DIAGNOSIS — D649 Anemia, unspecified: Secondary | ICD-10-CM | POA: Diagnosis not present

## 2016-02-06 DIAGNOSIS — K219 Gastro-esophageal reflux disease without esophagitis: Secondary | ICD-10-CM | POA: Diagnosis not present

## 2016-03-05 ENCOUNTER — Other Ambulatory Visit: Payer: Self-pay | Admitting: Obstetrics and Gynecology

## 2016-03-05 ENCOUNTER — Other Ambulatory Visit (HOSPITAL_COMMUNITY)
Admission: RE | Admit: 2016-03-05 | Discharge: 2016-03-05 | Disposition: A | Discharge status: Home / Self Care | Payer: BLUE CROSS/BLUE SHIELD | Source: Ambulatory Visit | Attending: Obstetrics and Gynecology | Admitting: Obstetrics and Gynecology

## 2016-03-05 DIAGNOSIS — Z1151 Encounter for screening for human papillomavirus (HPV): Secondary | ICD-10-CM | POA: Diagnosis not present

## 2016-03-05 DIAGNOSIS — Z01419 Encounter for gynecological examination (general) (routine) without abnormal findings: Secondary | ICD-10-CM | POA: Insufficient documentation

## 2016-03-05 DIAGNOSIS — Z01411 Encounter for gynecological examination (general) (routine) with abnormal findings: Secondary | ICD-10-CM | POA: Diagnosis not present

## 2016-03-06 LAB — CYTOLOGY - PAP

## 2016-03-27 DIAGNOSIS — N852 Hypertrophy of uterus: Secondary | ICD-10-CM | POA: Diagnosis not present

## 2016-03-28 DIAGNOSIS — Z1231 Encounter for screening mammogram for malignant neoplasm of breast: Secondary | ICD-10-CM | POA: Diagnosis not present

## 2016-05-13 ENCOUNTER — Telehealth: Payer: Self-pay | Admitting: *Deleted

## 2016-05-13 NOTE — Telephone Encounter (Signed)
Received call from patient asking to return her phone call. Was instructed to call [phone]. This number has not been set up to receive voicemail.

## 2016-05-24 DIAGNOSIS — R1032 Left lower quadrant pain: Secondary | ICD-10-CM | POA: Diagnosis not present

## 2016-05-29 DIAGNOSIS — R1032 Left lower quadrant pain: Secondary | ICD-10-CM | POA: Diagnosis not present

## 2016-05-29 DIAGNOSIS — Z85048 Personal history of other malignant neoplasm of rectum, rectosigmoid junction, and anus: Secondary | ICD-10-CM | POA: Diagnosis not present

## 2016-05-29 DIAGNOSIS — R195 Other fecal abnormalities: Secondary | ICD-10-CM | POA: Diagnosis not present

## 2016-06-04 DIAGNOSIS — Z980 Intestinal bypass and anastomosis status: Secondary | ICD-10-CM | POA: Diagnosis not present

## 2016-06-04 DIAGNOSIS — Z85038 Personal history of other malignant neoplasm of large intestine: Secondary | ICD-10-CM | POA: Diagnosis not present

## 2016-08-20 DIAGNOSIS — G620 Drug-induced polyneuropathy: Secondary | ICD-10-CM | POA: Diagnosis not present

## 2016-08-20 DIAGNOSIS — I10 Essential (primary) hypertension: Secondary | ICD-10-CM | POA: Diagnosis not present

## 2016-08-20 DIAGNOSIS — Z23 Encounter for immunization: Secondary | ICD-10-CM | POA: Diagnosis not present

## 2016-08-20 DIAGNOSIS — E7800 Pure hypercholesterolemia, unspecified: Secondary | ICD-10-CM | POA: Diagnosis not present

## 2016-08-20 DIAGNOSIS — K219 Gastro-esophageal reflux disease without esophagitis: Secondary | ICD-10-CM | POA: Diagnosis not present

## 2016-10-14 DIAGNOSIS — R591 Generalized enlarged lymph nodes: Secondary | ICD-10-CM | POA: Diagnosis not present

## 2017-02-25 DIAGNOSIS — E7800 Pure hypercholesterolemia, unspecified: Secondary | ICD-10-CM | POA: Diagnosis not present

## 2017-02-25 DIAGNOSIS — K219 Gastro-esophageal reflux disease without esophagitis: Secondary | ICD-10-CM | POA: Diagnosis not present

## 2017-02-25 DIAGNOSIS — G620 Drug-induced polyneuropathy: Secondary | ICD-10-CM | POA: Diagnosis not present

## 2017-02-25 DIAGNOSIS — I10 Essential (primary) hypertension: Secondary | ICD-10-CM | POA: Diagnosis not present

## 2017-02-25 DIAGNOSIS — D649 Anemia, unspecified: Secondary | ICD-10-CM | POA: Diagnosis not present

## 2017-03-11 ENCOUNTER — Other Ambulatory Visit: Payer: Self-pay | Admitting: Obstetrics and Gynecology

## 2017-03-11 ENCOUNTER — Other Ambulatory Visit (HOSPITAL_COMMUNITY)
Admission: RE | Admit: 2017-03-11 | Discharge: 2017-03-11 | Disposition: A | Discharge status: Home / Self Care | Payer: BLUE CROSS/BLUE SHIELD | Source: Ambulatory Visit | Attending: Obstetrics and Gynecology | Admitting: Obstetrics and Gynecology

## 2017-03-11 DIAGNOSIS — Z01419 Encounter for gynecological examination (general) (routine) without abnormal findings: Secondary | ICD-10-CM | POA: Diagnosis not present

## 2017-03-11 DIAGNOSIS — Z01411 Encounter for gynecological examination (general) (routine) with abnormal findings: Secondary | ICD-10-CM | POA: Diagnosis not present

## 2017-03-14 LAB — CYTOLOGY - PAP: Diagnosis: NEGATIVE

## 2017-04-01 DIAGNOSIS — Z1231 Encounter for screening mammogram for malignant neoplasm of breast: Secondary | ICD-10-CM | POA: Diagnosis not present

## 2017-06-23 DIAGNOSIS — Z111 Encounter for screening for respiratory tuberculosis: Secondary | ICD-10-CM | POA: Diagnosis not present

## 2017-06-25 DIAGNOSIS — Z23 Encounter for immunization: Secondary | ICD-10-CM | POA: Diagnosis not present

## 2017-09-08 DIAGNOSIS — I10 Essential (primary) hypertension: Secondary | ICD-10-CM | POA: Diagnosis not present

## 2017-09-08 DIAGNOSIS — K219 Gastro-esophageal reflux disease without esophagitis: Secondary | ICD-10-CM | POA: Diagnosis not present

## 2017-09-08 DIAGNOSIS — E7800 Pure hypercholesterolemia, unspecified: Secondary | ICD-10-CM | POA: Diagnosis not present

## 2017-09-08 DIAGNOSIS — Z23 Encounter for immunization: Secondary | ICD-10-CM | POA: Diagnosis not present

## 2017-09-08 DIAGNOSIS — Z1159 Encounter for screening for other viral diseases: Secondary | ICD-10-CM | POA: Diagnosis not present

## 2017-09-08 DIAGNOSIS — G620 Drug-induced polyneuropathy: Secondary | ICD-10-CM | POA: Diagnosis not present

## 2018-02-23 DIAGNOSIS — M546 Pain in thoracic spine: Secondary | ICD-10-CM | POA: Diagnosis not present

## 2018-03-18 DIAGNOSIS — I10 Essential (primary) hypertension: Secondary | ICD-10-CM | POA: Diagnosis not present

## 2018-03-18 DIAGNOSIS — G620 Drug-induced polyneuropathy: Secondary | ICD-10-CM | POA: Diagnosis not present

## 2018-03-18 DIAGNOSIS — K219 Gastro-esophageal reflux disease without esophagitis: Secondary | ICD-10-CM | POA: Diagnosis not present

## 2018-03-18 DIAGNOSIS — E7800 Pure hypercholesterolemia, unspecified: Secondary | ICD-10-CM | POA: Diagnosis not present

## 2018-03-18 DIAGNOSIS — D649 Anemia, unspecified: Secondary | ICD-10-CM | POA: Diagnosis not present

## 2018-03-23 ENCOUNTER — Other Ambulatory Visit: Payer: Self-pay | Admitting: Obstetrics and Gynecology

## 2018-03-23 ENCOUNTER — Other Ambulatory Visit (HOSPITAL_COMMUNITY)
Admission: RE | Admit: 2018-03-23 | Discharge: 2018-03-23 | Disposition: A | Discharge status: Home / Self Care | Payer: BLUE CROSS/BLUE SHIELD | Source: Ambulatory Visit | Attending: Obstetrics and Gynecology | Admitting: Obstetrics and Gynecology

## 2018-03-23 DIAGNOSIS — Z01411 Encounter for gynecological examination (general) (routine) with abnormal findings: Secondary | ICD-10-CM | POA: Diagnosis not present

## 2018-03-25 LAB — CYTOLOGY - PAP
Diagnosis: NEGATIVE
HPV: NOT DETECTED

## 2018-04-06 DIAGNOSIS — Z1231 Encounter for screening mammogram for malignant neoplasm of breast: Secondary | ICD-10-CM | POA: Diagnosis not present

## 2018-06-10 DIAGNOSIS — R1013 Epigastric pain: Secondary | ICD-10-CM | POA: Diagnosis not present

## 2018-06-10 DIAGNOSIS — Z85048 Personal history of other malignant neoplasm of rectum, rectosigmoid junction, and anus: Secondary | ICD-10-CM | POA: Diagnosis not present

## 2018-06-10 DIAGNOSIS — K219 Gastro-esophageal reflux disease without esophagitis: Secondary | ICD-10-CM | POA: Diagnosis not present

## 2018-06-11 ENCOUNTER — Other Ambulatory Visit (HOSPITAL_COMMUNITY): Payer: Self-pay | Admitting: Gastroenterology

## 2018-06-11 DIAGNOSIS — R1013 Epigastric pain: Secondary | ICD-10-CM

## 2018-06-24 ENCOUNTER — Encounter (HOSPITAL_COMMUNITY): Payer: BLUE CROSS/BLUE SHIELD

## 2018-08-31 DIAGNOSIS — M1711 Unilateral primary osteoarthritis, right knee: Secondary | ICD-10-CM | POA: Diagnosis not present

## 2018-10-06 DIAGNOSIS — G620 Drug-induced polyneuropathy: Secondary | ICD-10-CM | POA: Diagnosis not present

## 2018-10-06 DIAGNOSIS — E7800 Pure hypercholesterolemia, unspecified: Secondary | ICD-10-CM | POA: Diagnosis not present

## 2018-10-06 DIAGNOSIS — K219 Gastro-esophageal reflux disease without esophagitis: Secondary | ICD-10-CM | POA: Diagnosis not present

## 2018-10-06 DIAGNOSIS — I10 Essential (primary) hypertension: Secondary | ICD-10-CM | POA: Diagnosis not present

## 2019-02-01 DIAGNOSIS — M546 Pain in thoracic spine: Secondary | ICD-10-CM | POA: Diagnosis not present

## 2019-04-06 DIAGNOSIS — E7800 Pure hypercholesterolemia, unspecified: Secondary | ICD-10-CM | POA: Diagnosis not present

## 2019-04-06 DIAGNOSIS — G620 Drug-induced polyneuropathy: Secondary | ICD-10-CM | POA: Diagnosis not present

## 2019-04-06 DIAGNOSIS — I10 Essential (primary) hypertension: Secondary | ICD-10-CM | POA: Diagnosis not present

## 2019-04-06 DIAGNOSIS — K219 Gastro-esophageal reflux disease without esophagitis: Secondary | ICD-10-CM | POA: Diagnosis not present

## 2019-05-12 DIAGNOSIS — Z1231 Encounter for screening mammogram for malignant neoplasm of breast: Secondary | ICD-10-CM | POA: Diagnosis not present

## 2019-05-24 ENCOUNTER — Encounter (HOSPITAL_COMMUNITY): Payer: Self-pay | Admitting: Emergency Medicine

## 2019-05-24 ENCOUNTER — Other Ambulatory Visit: Payer: Self-pay

## 2019-05-24 ENCOUNTER — Inpatient Hospital Stay (HOSPITAL_COMMUNITY)
Admission: EM | Admit: 2019-05-24 | Discharge: 2019-05-27 | DRG: 390 | Disposition: A | Discharge status: Home / Self Care | Payer: BC Managed Care – PPO | Attending: Internal Medicine | Admitting: Internal Medicine

## 2019-05-24 DIAGNOSIS — Z79899 Other long term (current) drug therapy: Secondary | ICD-10-CM | POA: Diagnosis not present

## 2019-05-24 DIAGNOSIS — K5651 Intestinal adhesions [bands], with partial obstruction: Principal | ICD-10-CM | POA: Diagnosis present

## 2019-05-24 DIAGNOSIS — D638 Anemia in other chronic diseases classified elsewhere: Secondary | ICD-10-CM | POA: Diagnosis not present

## 2019-05-24 DIAGNOSIS — R1111 Vomiting without nausea: Secondary | ICD-10-CM | POA: Diagnosis present

## 2019-05-24 DIAGNOSIS — Z791 Long term (current) use of non-steroidal anti-inflammatories (NSAID): Secondary | ICD-10-CM | POA: Diagnosis not present

## 2019-05-24 DIAGNOSIS — K219 Gastro-esophageal reflux disease without esophagitis: Secondary | ICD-10-CM | POA: Diagnosis not present

## 2019-05-24 DIAGNOSIS — Z9221 Personal history of antineoplastic chemotherapy: Secondary | ICD-10-CM | POA: Diagnosis not present

## 2019-05-24 DIAGNOSIS — I10 Essential (primary) hypertension: Secondary | ICD-10-CM | POA: Diagnosis present

## 2019-05-24 DIAGNOSIS — Z03818 Encounter for observation for suspected exposure to other biological agents ruled out: Secondary | ICD-10-CM | POA: Diagnosis not present

## 2019-05-24 DIAGNOSIS — Z20828 Contact with and (suspected) exposure to other viral communicable diseases: Secondary | ICD-10-CM | POA: Diagnosis not present

## 2019-05-24 DIAGNOSIS — Z923 Personal history of irradiation: Secondary | ICD-10-CM | POA: Diagnosis not present

## 2019-05-24 DIAGNOSIS — E876 Hypokalemia: Secondary | ICD-10-CM | POA: Diagnosis not present

## 2019-05-24 DIAGNOSIS — Z4682 Encounter for fitting and adjustment of non-vascular catheter: Secondary | ICD-10-CM | POA: Diagnosis not present

## 2019-05-24 DIAGNOSIS — K56609 Unspecified intestinal obstruction, unspecified as to partial versus complete obstruction: Secondary | ICD-10-CM

## 2019-05-24 DIAGNOSIS — R1110 Vomiting, unspecified: Secondary | ICD-10-CM | POA: Diagnosis not present

## 2019-05-24 DIAGNOSIS — Z85048 Personal history of other malignant neoplasm of rectum, rectosigmoid junction, and anus: Secondary | ICD-10-CM

## 2019-05-24 DIAGNOSIS — K30 Functional dyspepsia: Secondary | ICD-10-CM | POA: Diagnosis not present

## 2019-05-24 DIAGNOSIS — K5669 Other partial intestinal obstruction: Secondary | ICD-10-CM | POA: Diagnosis not present

## 2019-05-24 DIAGNOSIS — Z0189 Encounter for other specified special examinations: Secondary | ICD-10-CM

## 2019-05-24 DIAGNOSIS — Z87891 Personal history of nicotine dependence: Secondary | ICD-10-CM | POA: Diagnosis not present

## 2019-05-24 LAB — COMPREHENSIVE METABOLIC PANEL
ALT: 13 U/L (ref 0–44)
AST: 16 U/L (ref 15–41)
Albumin: 4.4 g/dL (ref 3.5–5.0)
Alkaline Phosphatase: 61 U/L (ref 38–126)
Anion gap: 11 (ref 5–15)
BUN: 12 mg/dL (ref 8–23)
CO2: 23 mmol/L (ref 22–32)
Calcium: 9.4 mg/dL (ref 8.9–10.3)
Chloride: 106 mmol/L (ref 98–111)
Creatinine, Ser: 0.63 mg/dL (ref 0.44–1.00)
GFR calc Af Amer: >60 mL/min (ref >60)
GFR calc non Af Amer: >60 mL/min (ref >60)
Glucose, Bld: 113 mg/dL — ABNORMAL HIGH (ref 70–99)
Potassium: 3.8 mmol/L (ref 3.5–5.1)
Sodium: 140 mmol/L (ref 135–145)
Total Bilirubin: 1 mg/dL (ref 0.3–1.2)
Total Protein: 7.8 g/dL (ref 6.5–8.1)

## 2019-05-24 LAB — URINALYSIS, ROUTINE W REFLEX MICROSCOPIC
Bilirubin Urine: NEGATIVE
Glucose, UA: NEGATIVE mg/dL
Ketones, ur: 20 mg/dL — AB
Leukocytes,Ua: NEGATIVE
Nitrite: NEGATIVE
Protein, ur: 30 mg/dL — AB
Specific Gravity, Urine: 1.028 (ref 1.005–1.030)
pH: 5 (ref 5.0–8.0)

## 2019-05-24 LAB — CBC
HCT: 41.3 % (ref 36.0–46.0)
Hemoglobin: 13.1 g/dL (ref 12.0–15.0)
MCH: 29.4 pg (ref 26.0–34.0)
MCHC: 31.7 g/dL (ref 30.0–36.0)
MCV: 92.8 fL (ref 80.0–100.0)
Platelets: 185 10*3/uL (ref 150–400)
RBC: 4.45 MIL/uL (ref 3.87–5.11)
RDW: 13.8 % (ref 11.5–15.5)
WBC: 6.9 10*3/uL (ref 4.0–10.5)
nRBC: 0 % (ref 0.0–0.2)

## 2019-05-24 LAB — LIPASE, BLOOD: Lipase: 23 U/L (ref 11–51)

## 2019-05-24 MED ORDER — FAMOTIDINE IN NACL 20-0.9 MG/50ML-% IV SOLN
20.0000 mg | Freq: Once | INTRAVENOUS | Status: AC
  Administered 2019-05-25: 20 mg via INTRAVENOUS
  Filled 2019-05-24: qty 50

## 2019-05-24 MED ORDER — ONDANSETRON HCL 4 MG/2ML IJ SOLN
4.0000 mg | Freq: Once | INTRAMUSCULAR | Status: AC
  Administered 2019-05-25: 4 mg via INTRAVENOUS
  Filled 2019-05-24: qty 2

## 2019-05-24 MED ORDER — SODIUM CHLORIDE 0.9 % IV BOLUS
1000.0000 mL | Freq: Once | INTRAVENOUS | Status: AC
  Administered 2019-05-25: 1000 mL via INTRAVENOUS

## 2019-05-24 MED ORDER — SODIUM CHLORIDE 0.9% FLUSH: 3.0000 mL | Freq: Once | INTRAVENOUS | Status: DC

## 2019-05-24 NOTE — ED Triage Notes (Signed)
Pt complaint of abd pain onset Friday, resolved, and restarted today with n/v/d.

## 2019-05-24 NOTE — ED Provider Notes (Signed)
[facility] DEPT Provider Note   CSN: [id number] Arrival date & time: 05/24/19  1259     History   Chief Complaint Chief Complaint  Patient presents with  . Abdominal Pain    HPI Tiffany MANIGAULT is a 63 y.o. female with history of adenocarcinoma of the upper rectum s/p posterior laparoscopic low anterior resection in March 2009 and ostomy take down in August 2009, s/p chemotherapy in 2009 who presents to the emergency department with upper abdominal pain accompanied by nausea, vomiting, and loose stools, onset three days ago.  The patient endorses non-radiating epigastric pain that has been intermittent since onset three days ago.  She reports that the pain will last for a couple of minutes before resolving.  She has had frequent episodes of pain since onset yesterday.  She reports she is also having some burning discomfort in her upper chest and was concerned that she may be having acid reflux, but has been compliant with her home PPI.  She reports that she had 4 episodes of loose stools yesterday, but has not had a bowel movement in the last 24 hours.  She is also had approximately 2-3 episodes of nonbloody, nonbilious vomiting 3 days ago that returned today.  She denies fever, chills, back pain, dysuria, hematuria, vaginal pain or bleeding, cough, shortness of breath, or chest pain.      The history is provided by the patient. No language interpreter was used.    Past Medical History:  Diagnosis Date  . Colon cancer ([place])   . GERD (gastroesophageal reflux disease)   . Hypertension   . Malignant neoplasm of rectum, rectosigmoid junction, and anus   . Vaginal delivery [id number]    Patient Active Problem List   Diagnosis Date Noted  . SBO (small bowel obstruction) ([place]) 05/25/2019    Past Surgical History:  Procedure Laterality Date  . CERVICAL CONIZATION W/BX N/A 04/04/2015   Procedure: CONIZATION CERVIX WITH BIOPSY;  Surgeon: Thurnell Lose,  MD;  Location: [facility];  Service: Gynecology;  Laterality: N/A;  . COLON SURGERY    . MOUTH SURGERY    . widom teeth        OB History   No obstetric history on file.      Home Medications    Prior to Admission medications   Medication Sig Start Date End Date Taking? Authorizing Provider  docusate sodium (COLACE) 100 MG capsule Take 100 mg by mouth daily.   Yes [provider]  ferrous sulfate 325 (65 FE) MG EC tablet Take one tablet twice a day. Patient taking differently: Take 325 mg by mouth daily with breakfast. Take one tablet twice a day. 05/09/15  Yes Wyatt Portela, MD  metoprolol succinate (TOPROL-XL) 100 MG 24 hr tablet Take 100 mg by mouth daily. Take with or immediately following a meal.   Yes [provider]  omeprazole (PRILOSEC) 40 MG capsule Take 40 mg by mouth daily. 04/28/15  Yes [provider]  verapamil (CALAN-SR) 240 MG CR tablet Take 240 mg by mouth daily.    Yes [provider]  ibuprofen (ADVIL,MOTRIN) 600 MG tablet Take 1 tablet (600 mg total) by mouth every 6 (six) hours as needed. Patient not taking: Reported on 05/24/2019 04/04/15   Thurnell Lose, MD    Family History No family history on file.  Social History Social History   Tobacco Use  . Smoking status: Former Smoker    Types: Cigarettes  . Smokeless tobacco:  Never Used  Substance Use Topics  . Alcohol use: Yes    Comment: occasional   . Drug use: No     Allergies   Patient has no known allergies.   Review of Systems Review of Systems  Constitutional: Negative for activity change, chills and fever.  HENT: Negative for congestion and sore throat.   Eyes: Negative for visual disturbance.  Respiratory: Negative for shortness of breath.   Cardiovascular: Negative for chest pain.  Gastrointestinal: Positive for abdominal pain, diarrhea, nausea and vomiting. Negative for blood in stool and constipation.  Genitourinary: Negative for dysuria, enuresis,  flank pain, hematuria, urgency, vaginal bleeding and vaginal discharge.  Musculoskeletal: Negative for back pain.  Skin: Negative for rash.  Allergic/Immunologic: Negative for immunocompromised state.  Neurological: Negative for dizziness, seizures, weakness, numbness and headaches.  Psychiatric/Behavioral: Negative for confusion.     Physical Exam Updated Vital Signs BP 119/71 (BP Location: Left Arm)   Pulse 68   Temp 98.5 F (36.9 C) (Oral)   Resp 18   Ht 5\' 5"  (1.651 m)   Wt 54.9 kg   SpO2 96%   BMI 20.14 kg/m   Physical Exam Vitals signs and nursing note reviewed.  Constitutional:      General: She is not in acute distress.    Appearance: She is not ill-appearing, toxic-appearing or diaphoretic.  HENT:     Head: Normocephalic.  Eyes:     Conjunctiva/sclera: Conjunctivae normal.  Neck:     Musculoskeletal: Neck supple.  Cardiovascular:     Rate and Rhythm: Normal rate and regular rhythm.     Heart sounds: No murmur. No friction rub. No gallop.   Pulmonary:     Effort: Pulmonary effort is normal. No respiratory distress.     Breath sounds: No stridor. No wheezing or rhonchi.  Chest:     Chest wall: No tenderness.  Abdominal:     General: Bowel sounds are decreased. There is no distension.     Palpations: Abdomen is soft. There is no mass.     Tenderness: There is generalized abdominal tenderness. There is no right CVA tenderness, left CVA tenderness, guarding or rebound.     Hernia: No hernia is present.     Comments: Hypoactive bowel sounds in all 4 quadrants.  Diffusely tender to palpation throughout the abdomen.  Abdomen is soft and nondistended.  No palpable masses.  No tenderness over McBurney's point.  Negative Murphy sign.  No CVA tenderness bilaterally.  Skin:    General: Skin is warm.     Findings: No rash.  Neurological:     Mental Status: She is alert.  Psychiatric:        Behavior: Behavior normal.      ED Treatments / Results  Labs (all labs  ordered are listed, but only abnormal results are displayed) Labs Reviewed  COMPREHENSIVE METABOLIC PANEL - Abnormal; Notable for the following components:      Result Value   Glucose, Bld 113 (*)    All other components within normal limits  URINALYSIS, ROUTINE W REFLEX MICROSCOPIC - Abnormal; Notable for the following components:   APPearance HAZY (*)    Hgb urine dipstick SMALL (*)    Ketones, ur 20 (*)    Protein, ur 30 (*)    Bacteria, UA FEW (*)    All other components within normal limits  SARS CORONAVIRUS 2 (HOSPITAL ORDER, [facility])  URINE CULTURE  LIPASE, BLOOD  CBC  EKG None  Radiology Dg Abdomen 1 View  Result Date: 05/25/2019 CLINICAL DATA:  NG tube position. EXAM: ABDOMEN - 1 VIEW COMPARISON:  CT abdomen and pelvis 05/25/2019 FINDINGS: Dilated loops of small bowel are again noted in the left upper quadrant. Side port of the NG tube is in the fundus the stomach. Contrast is noted within the kidneys. IMPRESSION: Side port of the NG tube is in the fundus of the stomach. Electronically Signed   By: San Morelle M.D.   On: 05/25/2019 04:41   Ct Abdomen Pelvis W Contrast  Result Date: 05/25/2019 CLINICAL DATA:  Initial evaluation for acute abdominal pain, nausea, vomiting. EXAM: CT ABDOMEN AND PELVIS WITH CONTRAST TECHNIQUE: Multidetector CT imaging of the abdomen and pelvis was performed using the standard protocol following bolus administration of intravenous contrast. CONTRAST:  24mL OMNIPAQUE IOHEXOL 300 MG/ML  SOLN COMPARISON:  Prior CT from 11/01/2014 FINDINGS: Lower chest: Mild scattered subsegmental atelectatic changes seen within the visualized lung bases. Visualized lungs are otherwise clear. Hepatobiliary: 6.0 x 5.1 cm well-circumscribed simple cyst present within the posterior right hepatic lobe. Few additional scattered subcentimeter hypodensities too small the characterize, but suspected to reflect small cysts as well. Focal fat  deposition noted adjacent to the falciform ligament. Gallbladder within normal limits. No biliary dilatation. Pancreas: Pancreas within normal limits. Spleen: Spleen within normal limits. Adrenals/Urinary Tract: Adrenal glands are normal. Kidneys equal in size. Mildly heterogeneous nephrogram seen within the kidneys bilaterally, most notable at the upper pole of the right kidney. While this finding is suspected to be due to scattered areas of cortical thinning, possible early changes of pyelonephritis could also conceivably have this appearance. No significant perinephric fat stranding. Superimposed subcentimeter hypodensity within the upper pole the right kidney too small the characterize, but statistically likely reflects a small cyst. No nephrolithiasis or hydronephrosis. No focal enhancing renal mass. No hydroureter. Partially distended bladder within normal limits. Stomach/Bowel: Stomach within normal limits. Multiple prominent dilated loops of small bowel seen within the left and central abdomen, measuring up to 3 cm in diameter. Associated internal air-fluid levels. There is an apparent transition point within the left lower mid abdomen (series 5, image 37). Small bowel is largely decompressed distally. Finding consistent with small bowel obstruction. Associated scattered small volume free fluid within the pelvis, likely reactive. No pneumatosis or portal venous gas. Anastomotic suture lines noted at the right colon and rectosigmoid junction. Colon largely decompressed without acute finding. Vascular/Lymphatic: Normal intravascular enhancement seen throughout the intra-abdominal aorta. Mild aorto bi-iliac atherosclerotic disease. No aneurysm. Mesenteric vessels patent proximally. No adenopathy. Reproductive: Uterus and ovaries within normal limits. Mild congestion of the pelvic vasculature within the left adnexa noted. Other: No free intraperitoneal air. Musculoskeletal: No acute osseous finding. No discrete  lytic or blastic osseous lesions. Chronic grade 1 facet mediated anterolisthesis of L4 on L5 noted. IMPRESSION: 1. Findings consistent with small bowel obstruction with transition point within the left lower mid abdomen as above. Underlying adhesive disease is suspected. 2. Mildly heterogeneous nephrogram within the kidneys bilaterally, most notable at the upper pole of the right kidney. While this finding is suspected to be due to scattered areas of renal cortical thinning, possible early changes of pyelonephritis could also have this appearance. Correlation with urinalysis recommended. 3. Small volume free fluid within the pelvis, likely reactive. Electronically Signed   By: Jeannine Boga M.D.   On: 05/25/2019 03:04    Procedures Procedures (including critical care time)  Medications Ordered in ED Medications  sodium chloride flush (NS) 0.9 % injection 3 mL (3 mLs Intravenous Not Given 05/24/19 2231)  famotidine (PEPCID) IVPB 20 mg premix (0 mg Intravenous Stopped 05/25/19 0137)  ondansetron (ZOFRAN) injection 4 mg (4 mg Intravenous Given 05/25/19 0028)  sodium chloride 0.9 % bolus 1,000 mL (0 mLs Intravenous Stopped 05/25/19 0137)  iohexol (OMNIPAQUE) 300 MG/ML solution 100 mL (80 mLs Intravenous Contrast Given 05/25/19 0139)  sodium chloride (PF) 0.9 % injection (  Given by Other 05/25/19 0139)  dicyclomine (BENTYL) injection 20 mg (20 mg Intramuscular Given 05/25/19 0240)  morphine 4 MG/ML injection 4 mg (4 mg Intravenous Given 05/25/19 0350)  metoCLOPramide (REGLAN) injection 10 mg (10 mg Intravenous Given 05/25/19 0350)     Initial Impression / Assessment and Plan / ED Course  I have reviewed the triage vital signs and the nursing notes.  Pertinent labs & imaging results that were available during my care of the patient were reviewed by me and considered in my medical decision making (see chart for details).        63 year old female with history of adenocarcinoma of the upper rectum s/p  posterior laparoscopic low anterior resection in March 2009 and ostomy take down in August 2009, s/p chemotherapy in 2009 with them and abdominal pain, nausea, vomiting, diarrhea.  Diarrhea resolved 24 hours ago.  No constitutional symptoms.  She attributed symptoms to GERD, but has been compliant with her home PPI.  She has hemodynamically stable and in no acute distress on arrival to the ER.  No focal tenderness palpation on abdominal exam, but she does have hypoactive bowel sounds in all 4 quadrants.  Urinalysis is pending.  Labs are otherwise grossly unremarkable.  Given her history of colon cancer, will order CT abdomen pelvis.  CT with small bowel obstruction with transition point in the left lower mid abdomen thought to be secondary to underlying adhesive disease.  There is also mild heterogeneous nephrogram with the kidneys bilaterally could be early changes of pyelonephritis correlation with analysis recommended.  UA with small hemoglobin area, but negative nitrites and leukocytes.  Will send urine culture, but defer antibiotics at this time.  NG tube placed with post placement film showing termination in the fundus.  Pain is well controlled with morphine.  Will keep patient n.p.o. at this time.  Surgical consult deferred at this time as patient is hemodynamically stable and symptoms are well controlled, hospitalist will likely consult surgery in the AM.   Consulted Dr. Hal Hope, hospitalist, who will accept the patient for admission. The patient appears reasonably stabilized for admission considering the current resources, flow, and capabilities available in the ED at this time, and I doubt any other [facility] requiring further screening and/or treatment in the ED prior to admission.  Final Clinical Impressions(s) / ED Diagnoses   Final diagnoses:  SBO (small bowel obstruction) [facility])    ED Discharge Orders    None       Dann Galicia A, PA-C 05/25/19 0600    Mesner, Corene Cornea, MD 05/25/19  [phone]

## 2019-05-25 ENCOUNTER — Encounter (HOSPITAL_COMMUNITY): Payer: Self-pay

## 2019-05-25 ENCOUNTER — Emergency Department (HOSPITAL_COMMUNITY): Payer: BC Managed Care – PPO

## 2019-05-25 ENCOUNTER — Inpatient Hospital Stay (HOSPITAL_COMMUNITY): Payer: BC Managed Care – PPO

## 2019-05-25 DIAGNOSIS — K219 Gastro-esophageal reflux disease without esophagitis: Secondary | ICD-10-CM | POA: Diagnosis present

## 2019-05-25 DIAGNOSIS — K30 Functional dyspepsia: Secondary | ICD-10-CM | POA: Diagnosis not present

## 2019-05-25 DIAGNOSIS — D638 Anemia in other chronic diseases classified elsewhere: Secondary | ICD-10-CM | POA: Diagnosis present

## 2019-05-25 DIAGNOSIS — I10 Essential (primary) hypertension: Secondary | ICD-10-CM | POA: Diagnosis present

## 2019-05-25 DIAGNOSIS — Z85048 Personal history of other malignant neoplasm of rectum, rectosigmoid junction, and anus: Secondary | ICD-10-CM | POA: Diagnosis not present

## 2019-05-25 DIAGNOSIS — Z9221 Personal history of antineoplastic chemotherapy: Secondary | ICD-10-CM | POA: Diagnosis not present

## 2019-05-25 DIAGNOSIS — K5651 Intestinal adhesions [bands], with partial obstruction: Secondary | ICD-10-CM | POA: Diagnosis present

## 2019-05-25 DIAGNOSIS — E876 Hypokalemia: Secondary | ICD-10-CM | POA: Diagnosis present

## 2019-05-25 DIAGNOSIS — Z20828 Contact with and (suspected) exposure to other viral communicable diseases: Secondary | ICD-10-CM | POA: Diagnosis present

## 2019-05-25 DIAGNOSIS — Z923 Personal history of irradiation: Secondary | ICD-10-CM | POA: Diagnosis not present

## 2019-05-25 DIAGNOSIS — K56609 Unspecified intestinal obstruction, unspecified as to partial versus complete obstruction: Secondary | ICD-10-CM | POA: Diagnosis present

## 2019-05-25 DIAGNOSIS — Z87891 Personal history of nicotine dependence: Secondary | ICD-10-CM | POA: Diagnosis not present

## 2019-05-25 DIAGNOSIS — Z79899 Other long term (current) drug therapy: Secondary | ICD-10-CM | POA: Diagnosis not present

## 2019-05-25 DIAGNOSIS — Z791 Long term (current) use of non-steroidal anti-inflammatories (NSAID): Secondary | ICD-10-CM | POA: Diagnosis not present

## 2019-05-25 DIAGNOSIS — R1111 Vomiting without nausea: Secondary | ICD-10-CM | POA: Diagnosis present

## 2019-05-25 LAB — GLUCOSE, CAPILLARY: Glucose-Capillary: 97 mg/dL (ref 70–99)

## 2019-05-25 LAB — CBC WITH DIFFERENTIAL/PLATELET
Abs Immature Granulocytes: 0.02 10*3/uL (ref 0.00–0.07)
Basophils Absolute: 0 10*3/uL (ref 0.0–0.1)
Basophils Relative: 0 %
Eosinophils Absolute: 0 10*3/uL (ref 0.0–0.5)
Eosinophils Relative: 0 %
HCT: 35.4 % — ABNORMAL LOW (ref 36.0–46.0)
Hemoglobin: 11.4 g/dL — ABNORMAL LOW (ref 12.0–15.0)
Immature Granulocytes: 0 %
Lymphocytes Relative: 14 %
Lymphs Abs: 0.9 10*3/uL (ref 0.7–4.0)
MCH: 29.9 pg (ref 26.0–34.0)
MCHC: 32.2 g/dL (ref 30.0–36.0)
MCV: 92.9 fL (ref 80.0–100.0)
Monocytes Absolute: 0.5 10*3/uL (ref 0.1–1.0)
Monocytes Relative: 8 %
Neutro Abs: 4.9 10*3/uL (ref 1.7–7.7)
Neutrophils Relative %: 78 %
Platelets: 150 10*3/uL (ref 150–400)
RBC: 3.81 MIL/uL — ABNORMAL LOW (ref 3.87–5.11)
RDW: 13.7 % (ref 11.5–15.5)
WBC: 6.4 10*3/uL (ref 4.0–10.5)
nRBC: 0 % (ref 0.0–0.2)

## 2019-05-25 LAB — COMPREHENSIVE METABOLIC PANEL
ALT: 11 U/L (ref 0–44)
AST: 15 U/L (ref 15–41)
Albumin: 3.8 g/dL (ref 3.5–5.0)
Alkaline Phosphatase: 51 U/L (ref 38–126)
Anion gap: 9 (ref 5–15)
BUN: 13 mg/dL (ref 8–23)
CO2: 23 mmol/L (ref 22–32)
Calcium: 8.4 mg/dL — ABNORMAL LOW (ref 8.9–10.3)
Chloride: 107 mmol/L (ref 98–111)
Creatinine, Ser: 0.59 mg/dL (ref 0.44–1.00)
GFR calc Af Amer: >60 mL/min (ref >60)
GFR calc non Af Amer: >60 mL/min (ref >60)
Glucose, Bld: 104 mg/dL — ABNORMAL HIGH (ref 70–99)
Potassium: 3.3 mmol/L — ABNORMAL LOW (ref 3.5–5.1)
Sodium: 139 mmol/L (ref 135–145)
Total Bilirubin: 0.8 mg/dL (ref 0.3–1.2)
Total Protein: 7 g/dL (ref 6.5–8.1)

## 2019-05-25 LAB — HIV ANTIBODY (ROUTINE TESTING W REFLEX): HIV Screen 4th Generation wRfx: NONREACTIVE

## 2019-05-25 LAB — CBG MONITORING, ED: Glucose-Capillary: 92 mg/dL (ref 70–99)

## 2019-05-25 LAB — SARS CORONAVIRUS 2 BY RT PCR (HOSPITAL ORDER, PERFORMED IN ~~LOC~~ HOSPITAL LAB): SARS Coronavirus 2: NEGATIVE

## 2019-05-25 MED ORDER — ACETAMINOPHEN 325 MG PO TABS: 650.0000 mg | ORAL_TABLET | Freq: Four times a day (QID) | ORAL | Status: DC | PRN

## 2019-05-25 MED ORDER — DEXTROSE-NACL 5-0.9 % IV SOLN
INTRAVENOUS | Status: AC
  Administered 2019-05-25: 20:00:00 via INTRAVENOUS
  Filled 2019-05-25 (×2): qty 1000

## 2019-05-25 MED ORDER — DIATRIZOATE MEGLUMINE & SODIUM 66-10 % PO SOLN
90.0000 mL | Freq: Once | ORAL | Status: AC
  Administered 2019-05-25: 90 mL via NASOGASTRIC
  Filled 2019-05-25: qty 90

## 2019-05-25 MED ORDER — ONDANSETRON HCL 4 MG/2ML IJ SOLN: 4.0000 mg | Freq: Four times a day (QID) | INTRAMUSCULAR | Status: DC | PRN

## 2019-05-25 MED ORDER — ENOXAPARIN SODIUM 40 MG/0.4ML ~~LOC~~ SOLN
40.0000 mg | SUBCUTANEOUS | Status: DC
  Administered 2019-05-26: 40 mg via SUBCUTANEOUS
  Filled 2019-05-25: qty 0.4

## 2019-05-25 MED ORDER — MORPHINE SULFATE (PF) 2 MG/ML IV SOLN
1.0000 mg | INTRAVENOUS | Status: DC | PRN
  Administered 2019-05-25 (×2): 1 mg via INTRAVENOUS
  Filled 2019-05-25 (×2): qty 1

## 2019-05-25 MED ORDER — IOHEXOL 300 MG/ML  SOLN
100.0000 mL | Freq: Once | INTRAMUSCULAR | Status: AC | PRN
  Administered 2019-05-25: 80 mL via INTRAVENOUS

## 2019-05-25 MED ORDER — METOCLOPRAMIDE HCL 5 MG/ML IJ SOLN
10.0000 mg | Freq: Once | INTRAMUSCULAR | Status: AC
  Administered 2019-05-25: 10 mg via INTRAVENOUS
  Filled 2019-05-25: qty 2

## 2019-05-25 MED ORDER — DICYCLOMINE HCL 10 MG/ML IM SOLN
20.0000 mg | Freq: Once | INTRAMUSCULAR | Status: AC
  Administered 2019-05-25: 20 mg via INTRAMUSCULAR
  Filled 2019-05-25: qty 2

## 2019-05-25 MED ORDER — LABETALOL HCL 5 MG/ML IV SOLN
10.0000 mg | INTRAVENOUS | Status: DC | PRN
  Filled 2019-05-25: qty 4

## 2019-05-25 MED ORDER — ACETAMINOPHEN 650 MG RE SUPP: 650.0000 mg | Freq: Four times a day (QID) | RECTAL | Status: DC | PRN

## 2019-05-25 MED ORDER — SODIUM CHLORIDE (PF) 0.9 % IJ SOLN
INTRAMUSCULAR | Status: AC
  Administered 2019-05-25: 02:00:00
  Filled 2019-05-25: qty 50

## 2019-05-25 MED ORDER — MORPHINE SULFATE (PF) 4 MG/ML IV SOLN
4.0000 mg | Freq: Once | INTRAVENOUS | Status: AC
  Administered 2019-05-25: 4 mg via INTRAVENOUS
  Filled 2019-05-25: qty 1

## 2019-05-25 MED ORDER — ONDANSETRON HCL 4 MG PO TABS: 4.0000 mg | ORAL_TABLET | Freq: Four times a day (QID) | ORAL | Status: DC | PRN

## 2019-05-25 MED ORDER — POTASSIUM CHLORIDE 10 MEQ/100ML IV SOLN
10.0000 meq | INTRAVENOUS | Status: AC
  Administered 2019-05-25 (×3): 10 meq via INTRAVENOUS
  Filled 2019-05-25 (×3): qty 100

## 2019-05-25 NOTE — ED Notes (Signed)
Will PA at bedside.  

## 2019-05-25 NOTE — Consult Note (Addendum)
[facility] Surgery Consult Note  Tiffany Walsh [birth year]  [id number].    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  She underwent laparoscopic low anterior resection, tumor was T3N1 with 3 out of 19 lymph nodes positive.  Patient is received radiation with concomitant chemotherapy.  She had a takedown of her ostomy in August 2009.  And had chemotherapy after that.  She is followed by Dr. Dahlia Byes.  She presented to the ED last evening reporting complaints of abdominal pain that started last Friday 8/28.  Symptoms improved but returned on Sunday 8/30.  She had recurrent nausea, vomiting, and diarrhea.  She was able to take some soup on Sunday but vomited it later.  She continued to have nausea and vomiting on 05/24/19.  No BM on 05/24/19 she presented to the ED at [facility] long.  Work-up in the ED showed she was afebrile, vital signs were stable.  Labs are unremarkable.  CT of the abdomen shows small bowel obstruction with transition point in the left lower mid abdomen with a possible adhesive disease suspected.  There is some free fluid in the pelvis, no pneumatosis or portal gas, anastomotic suture line right colon and rectosigmoid junction.  The left colon was largely depressed and without acute findings.  She was seen in the ED and admitted by medicine.  An NG tube was placed and we are asked to see.  She reports she is never had any problem like this before.    ROS: Review of Systems  Constitutional: Negative.   HENT: Negative.   Eyes: Negative.   Respiratory: Negative.   Cardiovascular: Negative.   Gastrointestinal: Positive for abdominal pain, diarrhea, heartburn, nausea and vomiting. Negative for blood in stool, constipation and melena.  Genitourinary: Negative.    Musculoskeletal: Negative.   Skin: Negative.   Neurological: Negative.   Endo/Heme/Allergies: Negative.   Psychiatric/Behavioral: Negative.     Family History  Family history unknown: Yes    Past Medical History:  Diagnosis Date  . Colon cancer ([place])   . GERD (gastroesophageal reflux disease)   . Hypertension   . Malignant neoplasm of rectum, rectosigmoid junction, and anus   . Vaginal delivery [id number]    Past Surgical History:  Procedure Laterality Date  . CERVICAL CONIZATION W/BX N/A 04/04/2015   Procedure: CONIZATION CERVIX WITH BIOPSY;  Surgeon: Thurnell Lose, MD;  Location: [facility];  Service: Gynecology;  Laterality: N/A;  . COLON SURGERY -low anterior resection March 2009 Colostomy takedown in August 2009 Dr. Ronnald Collum [facility] surgery    . MOUTH SURGERY    . widom teeth       Social History:  reports that she has quit smoking. Her smoking use included cigarettes. She has never used smokeless tobacco. She reports current alcohol use. She reports that she does not use drugs. Tobacco: None for 20 years Drugs: None EtOH: Social Allergies: No Known Allergies  Prior to Admission medications   Medication Sig Start Date End Date Taking? Authorizing Provider  docusate sodium (COLACE) 100 MG capsule Take 100 mg by mouth daily.   Yes [provider]  ferrous sulfate 325 (65 FE) MG EC tablet Take one tablet twice a day. Patient taking differently: Take 325 mg by mouth daily with breakfast. Take one tablet twice a day.  05/09/15  Yes Wyatt Portela, MD  metoprolol succinate (TOPROL-XL) 100 MG 24 hr tablet Take 100 mg by mouth daily. Take with or immediately following a meal.   Yes [provider]  omeprazole (PRILOSEC) 40 MG capsule Take 40 mg by mouth daily. 04/28/15  Yes [provider]  verapamil (CALAN-SR) 240 MG CR tablet Take 240 mg by mouth daily.    Yes [provider]  ibuprofen (ADVIL,MOTRIN) 600 MG tablet Take 1 tablet  (600 mg total) by mouth every 6 (six) hours as needed. Patient not taking: Reported on 05/24/2019 04/04/15   Thurnell Lose, MD      Blood pressure 110/69, pulse 69, temperature 98.5 F (36.9 C), temperature source Oral, resp. rate 17, height 5\' 5"  (1.651 m), weight 54.9 kg, SpO2 94 %. Physical Exam: Physical Exam Constitutional:      General: She is not in acute distress.    Appearance: She is normal weight. She is ill-appearing (NG in place not working.). She is not toxic-appearing.  HENT:     Head: Normocephalic and atraumatic.     Nose:     Comments: NG in place Cardiovascular:     Rate and Rhythm: Normal rate and regular rhythm.     Heart sounds: No murmur.  Pulmonary:     Effort: Pulmonary effort is normal. No respiratory distress.     Breath sounds: Normal breath sounds. No stridor. No wheezing, rhonchi or rales.  Chest:     Chest wall: No tenderness.  Abdominal:     General: There is distension.     Palpations: There is no mass.     Tenderness: There is no abdominal tenderness. There is no right CVA tenderness, left CVA tenderness, guarding or rebound.     Hernia: No hernia is present.     Comments: Minimal distention bowel sounds are hypoactive.  Well-healed midline abdominal scar below the umbilicus.  Musculoskeletal:        General: No swelling.  Skin:    General: Skin is warm and dry.     Capillary Refill: Capillary refill takes less than 2 seconds.  Neurological:     General: No focal deficit present.     Mental Status: She is alert and oriented to person, place, and time.     Cranial Nerves: No cranial nerve deficit.  Psychiatric:        Mood and Affect: Mood normal.        Behavior: Behavior normal.        Thought Content: Thought content normal.        Judgment: Judgment normal.     Results for orders placed or performed during the hospital encounter of 05/24/19 (from the past 48 hour(s))  Lipase, blood     Status: None   Collection Time: 05/24/19   3:33 PM  Result Value Ref Range   Lipase 23 11 - 51 U/L    Comment: Performed at [facility], [place] [address] [place], [place], [place] [address]  Comprehensive metabolic panel     Status: Abnormal   Collection Time: 05/24/19  3:33 PM  Result Value Ref Range   Sodium 140 135 - 145 mmol/L   Potassium 3.8 3.5 - 5.1 mmol/L   Chloride 106 98 - 111 mmol/L   CO2 23 22 - 32 mmol/L   Glucose, Bld 113 (H) 70 - 99 mg/dL   BUN 12 8 - 23 mg/dL   Creatinine, Ser 0.63 0.44 - 1.00 mg/dL  Calcium 9.4 8.9 - 10.3 mg/dL   Total Protein 7.8 6.5 - 8.1 g/dL   Albumin 4.4 3.5 - 5.0 g/dL   AST 16 15 - 41 U/L   ALT 13 0 - 44 U/L   Alkaline Phosphatase 61 38 - 126 U/L   Total Bilirubin 1.0 0.3 - 1.2 mg/dL   GFR calc non Af Amer >60 >60 mL/min   GFR calc Af Amer >60 >60 mL/min   Anion gap 11 5 - 15    Comment: Performed at [facility], [place] [address], [place], [place] [address]  CBC     Status: None   Collection Time: 05/24/19  3:33 PM  Result Value Ref Range   WBC 6.9 4.0 - 10.5 K/uL   RBC 4.45 3.87 - 5.11 MIL/uL   Hemoglobin 13.1 12.0 - 15.0 g/dL   HCT 41.3 36.0 - 46.0 %   MCV 92.8 80.0 - 100.0 fL   MCH 29.4 26.0 - 34.0 pg   MCHC 31.7 30.0 - 36.0 g/dL   RDW 13.8 11.5 - 15.5 %   Platelets 185 150 - 400 K/uL   nRBC 0.0 0.0 - 0.2 %    Comment: Performed at [facility], [place] [address] [place], [place] [address]  Urinalysis, Routine w reflex microscopic     Status: Abnormal   Collection Time: 05/24/19  3:34 PM  Result Value Ref Range   Color, Urine YELLOW YELLOW   APPearance HAZY (A) CLEAR   Specific Gravity, Urine 1.028 1.005 - 1.030   pH 5.0 5.0 - 8.0   Glucose, UA NEGATIVE NEGATIVE mg/dL   Hgb urine dipstick SMALL (A) NEGATIVE   Bilirubin Urine NEGATIVE NEGATIVE   Ketones, ur 20 (A) NEGATIVE mg/dL   Protein, ur 30 (A) NEGATIVE mg/dL   Nitrite NEGATIVE NEGATIVE   Leukocytes,Ua NEGATIVE NEGATIVE   RBC / HPF 6-10 0 - 5 RBC/hpf   WBC, UA 0-5  0 - 5 WBC/hpf   Bacteria, UA FEW (A) NONE SEEN   Squamous Epithelial / LPF 11-20 0 - 5   Mucus PRESENT     Comment: Performed at [facility], [place] [address], [place], [place] [address]  SARS Coronavirus 2 [facility] order, Performed in [facility] hospital lab)     Status: None   Collection Time: 05/25/19  3:39 AM  Result Value Ref Range   SARS Coronavirus 2 NEGATIVE NEGATIVE    Comment: (NOTE) If result is NEGATIVE SARS-CoV-2 target nucleic acids are NOT DETECTED. The SARS-CoV-2 RNA is generally detectable in upper and lower  respiratory specimens during the acute phase of infection. The lowest  concentration of SARS-CoV-2 viral copies this assay can detect is 250  copies / mL. A negative result does not preclude SARS-CoV-2 infection  and should not be used as the sole basis for treatment or other  patient management decisions.  A negative result may occur with  improper specimen collection / handling, submission of specimen other  than nasopharyngeal swab, presence of viral mutation(s) within the  areas targeted by this assay, and inadequate number of viral copies  (<250 copies / mL). A negative result must be combined with clinical  observations, patient history, and epidemiological information. If result is POSITIVE SARS-CoV-2 target nucleic acids are DETECTED. The SARS-CoV-2 RNA is generally detectable in upper and lower  respiratory specimens dur ing the acute phase of infection.  Positive  results are indicative of active infection with SARS-CoV-2.  Clinical  correlation  with patient history and other diagnostic information is  necessary to determine patient infection status.  Positive results do  not rule out bacterial infection or co-infection with other viruses. If result is PRESUMPTIVE POSTIVE SARS-CoV-2 nucleic acids MAY BE PRESENT.   A presumptive positive result was obtained on the submitted specimen  and confirmed on repeat testing.  While 2019  novel coronavirus  (SARS-CoV-2) nucleic acids may be present in the submitted sample  additional confirmatory testing may be necessary for epidemiological  and / or clinical management purposes  to differentiate between  SARS-CoV-2 and other Sarbecovirus currently known to infect humans.  If clinically indicated additional testing with an alternate test  methodology [id number]) is advised. The SARS-CoV-2 RNA is generally  detectable in upper and lower respiratory sp ecimens during the acute  phase of infection. The expected result is Negative. Fact Sheet for Patients:  StrictlyIdeas.no Fact Sheet for Healthcare Providers: BankingDealers.co.za This test is not yet approved or cleared by the [place] FDA and has been authorized for detection and/or diagnosis of SARS-CoV-2 by FDA under an Emergency Use Authorization (EUA).  This EUA will remain in effect (meaning this test can be used) for the duration of the COVID-19 declaration under Section 564(b)(1) of the Act, 21 U.S.C. section 360bbb-3(b)(1), unless the authorization is terminated or revoked sooner. Performed at [facility], [place] [address], [place], [place] [address]    Dg Abdomen 1 View  Result Date: 05/25/2019 CLINICAL DATA:  NG tube position. EXAM: ABDOMEN - 1 VIEW COMPARISON:  CT abdomen and pelvis 05/25/2019 FINDINGS: Dilated loops of small bowel are again noted in the left upper quadrant. Side port of the NG tube is in the fundus the stomach. Contrast is noted within the kidneys. IMPRESSION: Side port of the NG tube is in the fundus of the stomach. Electronically Signed   By: San Morelle M.D.   On: 05/25/2019 04:41   Ct Abdomen Pelvis W Contrast  Result Date: 05/25/2019 CLINICAL DATA:  Initial evaluation for acute abdominal pain, nausea, vomiting. EXAM: CT ABDOMEN AND PELVIS WITH CONTRAST TECHNIQUE: Multidetector CT imaging of the abdomen and pelvis  was performed using the standard protocol following bolus administration of intravenous contrast. CONTRAST:  44mL OMNIPAQUE IOHEXOL 300 MG/ML  SOLN COMPARISON:  Prior CT from 11/01/2014 FINDINGS: Lower chest: Mild scattered subsegmental atelectatic changes seen within the visualized lung bases. Visualized lungs are otherwise clear. Hepatobiliary: 6.0 x 5.1 cm well-circumscribed simple cyst present within the posterior right hepatic lobe. Few additional scattered subcentimeter hypodensities too small the characterize, but suspected to reflect small cysts as well. Focal fat deposition noted adjacent to the falciform ligament. Gallbladder within normal limits. No biliary dilatation. Pancreas: Pancreas within normal limits. Spleen: Spleen within normal limits. Adrenals/Urinary Tract: Adrenal glands are normal. Kidneys equal in size. Mildly heterogeneous nephrogram seen within the kidneys bilaterally, most notable at the upper pole of the right kidney. While this finding is suspected to be due to scattered areas of cortical thinning, possible early changes of pyelonephritis could also conceivably have this appearance. No significant perinephric fat stranding. Superimposed subcentimeter hypodensity within the upper pole the right kidney too small the characterize, but statistically likely reflects a small cyst. No nephrolithiasis or hydronephrosis. No focal enhancing renal mass. No hydroureter. Partially distended bladder within normal limits. Stomach/Bowel: Stomach within normal limits. Multiple prominent dilated loops of small bowel seen within the left and central abdomen, measuring up to 3 cm in diameter. Associated internal air-fluid levels. There  is an apparent transition point within the left lower mid abdomen (series 5, image 37). Small bowel is largely decompressed distally. Finding consistent with small bowel obstruction. Associated scattered small volume free fluid within the pelvis, likely reactive. No  pneumatosis or portal venous gas. Anastomotic suture lines noted at the right colon and rectosigmoid junction. Colon largely decompressed without acute finding. Vascular/Lymphatic: Normal intravascular enhancement seen throughout the intra-abdominal aorta. Mild aorto bi-iliac atherosclerotic disease. No aneurysm. Mesenteric vessels patent proximally. No adenopathy. Reproductive: Uterus and ovaries within normal limits. Mild congestion of the pelvic vasculature within the left adnexa noted. Other: No free intraperitoneal air. Musculoskeletal: No acute osseous finding. No discrete lytic or blastic osseous lesions. Chronic grade 1 facet mediated anterolisthesis of L4 on L5 noted. IMPRESSION: 1. Findings consistent with small bowel obstruction with transition point within the left lower mid abdomen as above. Underlying adhesive disease is suspected. 2. Mildly heterogeneous nephrogram within the kidneys bilaterally, most notable at the upper pole of the right kidney. While this finding is suspected to be due to scattered areas of renal cortical thinning, possible early changes of pyelonephritis could also have this appearance. Correlation with urinalysis recommended. 3. Small volume free fluid within the pelvis, likely reactive. Electronically Signed   By: Jeannine Boga M.D.   On: 05/25/2019 03:04   . dextrose 5 % and 0.9% NaCl      Assessment/Plan SBO with history of LAR/colostomy takedown 2009, for adenocarcinoma of the rectum S/p chemotherapy and radiation therapy - Dr. Dahlia Byes Hypertension GERD  FEN:NPO/IV fluiids ID: none DVT: SCDs/she can have chemical DVT prophylaxis from our standpoint Follow-up: To be determined POC: Brule,Dale Spouse [phone]   Plan: I reviewed the films her NG is in too far I will asked the nurses to pull it back a little bit.  I did irrigate it and the NG tube is working.  We will leave her on suction for couple hours and then will start her on small bowel  protocol.  We will follow with you.   [facility] Surgery Pager:  [phone]    05/25/2019 7:21 AM

## 2019-05-25 NOTE — ED Notes (Signed)
Pt placed on bedside commode, call light within reach and pt instructed to push call bell when finished to be assisted back to the bed.

## 2019-05-25 NOTE — ED Notes (Addendum)
Admitting MD came down to see pt and requested that pt go ahead and be given the gastrografin since previous nurse had held it until a time for small bowel study was known. This nurse went ahead and administered.

## 2019-05-25 NOTE — H&P (Signed)
History and Physical    Tiffany Walsh [id number] DOB: 13-Oct-1955 DOA: 05/24/2019  PCP: Alroy Dust, L.Marlou Sa, MD   Patient coming from: Home.  Chief Complaint: Abdominal pain nausea vomiting.  HPI: ARIANN Walsh is a 63 y.o. female with history of rectal cancer status post low anterior resection in 2009 being followed by [facility] and Dr. Alen Blew oncologist presents to the ER with complaints of abdominal pain and nausea vomiting since yesterday morning.  Had at least 4 episodes of vomiting denies any blood in the vomitus.  Patient states on Sunday 2 days ago patient had diarrheal episodes.  Last bowel movement was more than 48 hours ago.  Due to persistent vomiting patient came to the ER.  Denies chest pain shortness of breath productive cough fever chills.  ED Course: In the ER patient CT scan abdomen pelvis shows small bowel obstruction with transition point NG tube was placed.  Patient is being admitted for small bowel obstruction.  Review of Systems: As per HPI, rest all negative.   Past Medical History:  Diagnosis Date   Colon cancer ([place])    GERD (gastroesophageal reflux disease)    Hypertension    Malignant neoplasm of rectum, rectosigmoid junction, and anus    Vaginal delivery [id number]    Past Surgical History:  Procedure Laterality Date   CERVICAL CONIZATION W/BX N/A 04/04/2015   Procedure: CONIZATION CERVIX WITH BIOPSY;  Surgeon: Thurnell Lose, MD;  Location: [facility];  Service: Gynecology;  Laterality: N/A;   COLON SURGERY     MOUTH SURGERY     widom teeth        reports that she has quit smoking. Her smoking use included cigarettes. She has never used smokeless tobacco. She reports current alcohol use. She reports that she does not use drugs.  No Known Allergies  Family History  Family history unknown: Yes    Prior to Admission medications   Medication Sig Start Date End Date Taking? Authorizing Provider  docusate sodium (COLACE) 100 MG capsule Take  100 mg by mouth daily.   Yes [provider]  ferrous sulfate 325 (65 FE) MG EC tablet Take one tablet twice a day. Patient taking differently: Take 325 mg by mouth daily with breakfast. Take one tablet twice a day. 05/09/15  Yes Wyatt Portela, MD  metoprolol succinate (TOPROL-XL) 100 MG 24 hr tablet Take 100 mg by mouth daily. Take with or immediately following a meal.   Yes [provider]  omeprazole (PRILOSEC) 40 MG capsule Take 40 mg by mouth daily. 04/28/15  Yes [provider]  verapamil (CALAN-SR) 240 MG CR tablet Take 240 mg by mouth daily.    Yes [provider]  ibuprofen (ADVIL,MOTRIN) 600 MG tablet Take 1 tablet (600 mg total) by mouth every 6 (six) hours as needed. Patient not taking: Reported on 05/24/2019 04/04/15   Thurnell Lose, MD    Physical Exam: Constitutional: Moderately built and nourished. Vitals:   05/25/19 0400 05/25/19 0500 05/25/19 0530 05/25/19 0600  BP: (!) 154/90 119/78 119/71 112/74  Pulse: 86 71 68 72  Resp: 18 18 18 18   Temp:      TempSrc:      SpO2: 98% 95% 96% 95%  Weight:      Height:       Eyes: Anicteric no pallor. ENMT: No discharge from the ears eyes nose or mouth. Neck: No mass felt.  No neck rigidity. Respiratory: No rhonchi or crepitations. Cardiovascular: S1-S2 heard. Abdomen:  Soft bowel sounds not appreciated no guarding or rigidity. Musculoskeletal: No edema. Skin: No rash. Neurologic: Alert awake oriented to time place and person.  Moves all extremities. Psychiatric: Appears normal per normal affect.   Labs on Admission: I have personally reviewed following labs and imaging studies  CBC: Recent Labs  Lab 05/24/19 1533  WBC 6.9  HGB 13.1  HCT 41.3  MCV 92.8  PLT 123XX123   Basic Metabolic Panel: Recent Labs  Lab 05/24/19 1533  NA 140  K 3.8  CL 106  CO2 23  GLUCOSE 113*  BUN 12  CREATININE 0.63  CALCIUM 9.4   GFR: Estimated Creatinine Clearance: 62.4 mL/min (by C-G formula based  on SCr of 0.63 mg/dL). Liver Function Tests: Recent Labs  Lab 05/24/19 1533  AST 16  ALT 13  ALKPHOS 61  BILITOT 1.0  PROT 7.8  ALBUMIN 4.4   Recent Labs  Lab 05/24/19 1533  LIPASE 23   No results for input(s): AMMONIA in the last 168 hours. Coagulation Profile: No results for input(s): INR, PROTIME in the last 168 hours. Cardiac Enzymes: No results for input(s): CKTOTAL, CKMB, CKMBINDEX, TROPONINI in the last 168 hours. BNP (last 3 results) No results for input(s): PROBNP in the last 8760 hours. HbA1C: No results for input(s): HGBA1C in the last 72 hours. CBG: No results for input(s): GLUCAP in the last 168 hours. Lipid Profile: No results for input(s): CHOL, HDL, LDLCALC, TRIG, CHOLHDL, LDLDIRECT in the last 72 hours. Thyroid Function Tests: No results for input(s): TSH, T4TOTAL, FREET4, T3FREE, THYROIDAB in the last 72 hours. Anemia Panel: No results for input(s): VITAMINB12, FOLATE, FERRITIN, TIBC, IRON, RETICCTPCT in the last 72 hours. Urine analysis:    Component Value Date/Time   COLORURINE YELLOW 05/24/2019 1534   APPEARANCEUR HAZY (A) 05/24/2019 1534   LABSPEC 1.028 05/24/2019 1534   PHURINE 5.0 05/24/2019 1534   GLUCOSEU NEGATIVE 05/24/2019 1534   HGBUR SMALL (A) 05/24/2019 1534   BILIRUBINUR NEGATIVE 05/24/2019 1534   KETONESUR 20 (A) 05/24/2019 1534   PROTEINUR 30 (A) 05/24/2019 1534   NITRITE NEGATIVE 05/24/2019 1534   LEUKOCYTESUR NEGATIVE 05/24/2019 1534   Sepsis Labs: @LABRCNTIP (procalcitonin:4,lacticidven:4) ) Recent Results (from the past 240 hour(s))  SARS Coronavirus 2 [facility] order, Performed in [place] hospital lab)     Status: None   Collection Time: 05/25/19  3:39 AM  Result Value Ref Range Status   SARS Coronavirus 2 NEGATIVE NEGATIVE Final    Comment: (NOTE) If result is NEGATIVE SARS-CoV-2 target nucleic acids are NOT DETECTED. The SARS-CoV-2 RNA is generally detectable in upper and lower  respiratory specimens during the  acute phase of infection. The lowest  concentration of SARS-CoV-2 viral copies this assay can detect is 250  copies / mL. A negative result does not preclude SARS-CoV-2 infection  and should not be used as the sole basis for treatment or other  patient management decisions.  A negative result may occur with  improper specimen collection / handling, submission of specimen other  than nasopharyngeal swab, presence of viral mutation(s) within the  areas targeted by this assay, and inadequate number of viral copies  (<250 copies / mL). A negative result must be combined with clinical  observations, patient history, and epidemiological information. If result is POSITIVE SARS-CoV-2 target nucleic acids are DETECTED. The SARS-CoV-2 RNA is generally detectable in upper and lower  respiratory specimens dur ing the acute phase of infection.  Positive  results are indicative of active infection with SARS-CoV-2.  Clinical  correlation with patient history and other diagnostic information is  necessary to determine patient infection status.  Positive results do  not rule out bacterial infection or co-infection with other viruses. If result is PRESUMPTIVE POSTIVE SARS-CoV-2 nucleic acids MAY BE PRESENT.   A presumptive positive result was obtained on the submitted specimen  and confirmed on repeat testing.  While 2019 novel coronavirus  (SARS-CoV-2) nucleic acids may be present in the submitted sample  additional confirmatory testing may be necessary for epidemiological  and / or clinical management purposes  to differentiate between  SARS-CoV-2 and other Sarbecovirus currently known to infect humans.  If clinically indicated additional testing with an alternate test  methodology [phone]) is advised. The SARS-CoV-2 RNA is generally  detectable in upper and lower respiratory sp ecimens during the acute  phase of infection. The expected result is Negative. Fact Sheet for Patients:   StrictlyIdeas.no Fact Sheet for Healthcare Providers: BankingDealers.co.za This test is not yet approved or cleared by the [place] FDA and has been authorized for detection and/or diagnosis of SARS-CoV-2 by FDA under an Emergency Use Authorization (EUA).  This EUA will remain in effect (meaning this test can be used) for the duration of the COVID-19 declaration under Section 564(b)(1) of the Act, 21 U.S.C. section 360bbb-3(b)(1), unless the authorization is terminated or revoked sooner. Performed at [facility], [place] [address] [place], [place] [address]      Radiological Exams on Admission: Dg Abdomen 1 View  Result Date: 05/25/2019 CLINICAL DATA:  NG tube position. EXAM: ABDOMEN - 1 VIEW COMPARISON:  CT abdomen and pelvis 05/25/2019 FINDINGS: Dilated loops of small bowel are again noted in the left upper quadrant. Side port of the NG tube is in the fundus the stomach. Contrast is noted within the kidneys. IMPRESSION: Side port of the NG tube is in the fundus of the stomach. Electronically Signed   By: San Morelle M.D.   On: 05/25/2019 04:41   Ct Abdomen Pelvis W Contrast  Result Date: 05/25/2019 CLINICAL DATA:  Initial evaluation for acute abdominal pain, nausea, vomiting. EXAM: CT ABDOMEN AND PELVIS WITH CONTRAST TECHNIQUE: Multidetector CT imaging of the abdomen and pelvis was performed using the standard protocol following bolus administration of intravenous contrast. CONTRAST:  20mL OMNIPAQUE IOHEXOL 300 MG/ML  SOLN COMPARISON:  Prior CT from 11/01/2014 FINDINGS: Lower chest: Mild scattered subsegmental atelectatic changes seen within the visualized lung bases. Visualized lungs are otherwise clear. Hepatobiliary: 6.0 x 5.1 cm well-circumscribed simple cyst present within the posterior right hepatic lobe. Few additional scattered subcentimeter hypodensities too small the characterize, but suspected to reflect  small cysts as well. Focal fat deposition noted adjacent to the falciform ligament. Gallbladder within normal limits. No biliary dilatation. Pancreas: Pancreas within normal limits. Spleen: Spleen within normal limits. Adrenals/Urinary Tract: Adrenal glands are normal. Kidneys equal in size. Mildly heterogeneous nephrogram seen within the kidneys bilaterally, most notable at the upper pole of the right kidney. While this finding is suspected to be due to scattered areas of cortical thinning, possible early changes of pyelonephritis could also conceivably have this appearance. No significant perinephric fat stranding. Superimposed subcentimeter hypodensity within the upper pole the right kidney too small the characterize, but statistically likely reflects a small cyst. No nephrolithiasis or hydronephrosis. No focal enhancing renal mass. No hydroureter. Partially distended bladder within normal limits. Stomach/Bowel: Stomach within normal limits. Multiple prominent dilated loops of small bowel seen within the left and central abdomen, measuring up to  3 cm in diameter. Associated internal air-fluid levels. There is an apparent transition point within the left lower mid abdomen (series 5, image 37). Small bowel is largely decompressed distally. Finding consistent with small bowel obstruction. Associated scattered small volume free fluid within the pelvis, likely reactive. No pneumatosis or portal venous gas. Anastomotic suture lines noted at the right colon and rectosigmoid junction. Colon largely decompressed without acute finding. Vascular/Lymphatic: Normal intravascular enhancement seen throughout the intra-abdominal aorta. Mild aorto bi-iliac atherosclerotic disease. No aneurysm. Mesenteric vessels patent proximally. No adenopathy. Reproductive: Uterus and ovaries within normal limits. Mild congestion of the pelvic vasculature within the left adnexa noted. Other: No free intraperitoneal air. Musculoskeletal: No  acute osseous finding. No discrete lytic or blastic osseous lesions. Chronic grade 1 facet mediated anterolisthesis of L4 on L5 noted. IMPRESSION: 1. Findings consistent with small bowel obstruction with transition point within the left lower mid abdomen as above. Underlying adhesive disease is suspected. 2. Mildly heterogeneous nephrogram within the kidneys bilaterally, most notable at the upper pole of the right kidney. While this finding is suspected to be due to scattered areas of renal cortical thinning, possible early changes of pyelonephritis could also have this appearance. Correlation with urinalysis recommended. 3. Small volume free fluid within the pelvis, likely reactive. Electronically Signed   By: Jeannine Boga M.D.   On: 05/25/2019 03:04     Assessment/Plan Principal Problem:   SBO (small bowel obstruction) (H[facility]) Active Problems:   Essential hypertension    1. Small bowel obstruction -General surgery has been consulted.  NG tube has been placed.  N.p.o. IV fluids repeat x-ray.  Further recommendation per general surgery. 2. Hypertension -since patient is n.p.o. we will keep patient on PRN IV labetalol. 3. Abnormal findings of the kidney CAT scan.  UA is largely unremarkable. 4. History of rectal cancer status post low anterior resection in 2009.  Given that patient has small bowel obstruction and will need close follow-up for any further deterioration will need inpatient status.   DVT prophylaxis: SCDs. Code Status: Full code. Family Communication: Discussed with patient. Disposition Plan: Home. Consults called: General surgery. Admission status: Inpatient   Rise Patience MD [facility] Pager [phone].  If 7PM-7AM, please contact night-coverage [email or web address] Password [facility]  05/25/2019, 6:39 AM

## 2019-05-25 NOTE — ED Notes (Signed)
Per Will PA NG tube removed by 1 in. Approximately 100cc of orange tinged liquid removed to suction canister at this time. NG is on intermitant suction

## 2019-05-25 NOTE — Progress Notes (Addendum)
63 yo with hx of rectal cancer s/p low anterior resection in 2009 followed by [facility] and Dr. Osker Mason presented with abdominal pain, nausea, vomiting.   CT scan shows SBO with transition point within the L lower mid abdomen.  CT with findings concerning for early pyelo, but UA without LE or nitrites, low suspicion.  Pt has NG in place.  Small bowel protocol ordered by surgery.  Pt improved with pain meds and after NG placement.  Appears comfortable with husband at bedside as well.  We discussed her plan of care.  Will start DVT ppx (ok per surgery note).  See H&P for additional details.

## 2019-05-25 NOTE — ED Notes (Signed)
Patient transported to CT 

## 2019-05-26 ENCOUNTER — Inpatient Hospital Stay (HOSPITAL_COMMUNITY): Payer: BC Managed Care – PPO

## 2019-05-26 DIAGNOSIS — E876 Hypokalemia: Secondary | ICD-10-CM

## 2019-05-26 LAB — GLUCOSE, CAPILLARY
Glucose-Capillary: 122 mg/dL — ABNORMAL HIGH (ref 70–99)
Glucose-Capillary: 92 mg/dL (ref 70–99)

## 2019-05-26 LAB — CBC
HCT: 33.3 % — ABNORMAL LOW (ref 36.0–46.0)
Hemoglobin: 10.4 g/dL — ABNORMAL LOW (ref 12.0–15.0)
MCH: 29.7 pg (ref 26.0–34.0)
MCHC: 31.2 g/dL (ref 30.0–36.0)
MCV: 95.1 fL (ref 80.0–100.0)
Platelets: 128 10*3/uL — ABNORMAL LOW (ref 150–400)
RBC: 3.5 MIL/uL — ABNORMAL LOW (ref 3.87–5.11)
RDW: 13.9 % (ref 11.5–15.5)
WBC: 4.3 10*3/uL (ref 4.0–10.5)
nRBC: 0 % (ref 0.0–0.2)

## 2019-05-26 LAB — COMPREHENSIVE METABOLIC PANEL
ALT: 12 U/L (ref 0–44)
AST: 13 U/L — ABNORMAL LOW (ref 15–41)
Albumin: 3.3 g/dL — ABNORMAL LOW (ref 3.5–5.0)
Alkaline Phosphatase: 44 U/L (ref 38–126)
Anion gap: 8 (ref 5–15)
BUN: 11 mg/dL (ref 8–23)
CO2: 24 mmol/L (ref 22–32)
Calcium: 8.4 mg/dL — ABNORMAL LOW (ref 8.9–10.3)
Chloride: 111 mmol/L (ref 98–111)
Creatinine, Ser: 0.61 mg/dL (ref 0.44–1.00)
GFR calc Af Amer: >60 mL/min (ref >60)
GFR calc non Af Amer: >60 mL/min (ref >60)
Glucose, Bld: 117 mg/dL — ABNORMAL HIGH (ref 70–99)
Potassium: 3.5 mmol/L (ref 3.5–5.1)
Sodium: 143 mmol/L (ref 135–145)
Total Bilirubin: 0.8 mg/dL (ref 0.3–1.2)
Total Protein: 6.1 g/dL — ABNORMAL LOW (ref 6.5–8.1)

## 2019-05-26 LAB — MAGNESIUM: Magnesium: 1.9 mg/dL (ref 1.7–2.4)

## 2019-05-26 LAB — URINE CULTURE: Culture: <10000 — AB

## 2019-05-26 MED ORDER — PANTOPRAZOLE SODIUM 20 MG PO TBEC
20.0000 mg | DELAYED_RELEASE_TABLET | Freq: Every day | ORAL | Status: DC
  Administered 2019-05-26 – 2019-05-27 (×2): 20 mg via ORAL
  Filled 2019-05-26 (×2): qty 1

## 2019-05-26 MED ORDER — ALUM & MAG HYDROXIDE-SIMETH 200-200-20 MG/5ML PO SUSP
30.0000 mL | ORAL | Status: DC | PRN
  Administered 2019-05-26: 30 mL via ORAL
  Filled 2019-05-26: qty 30

## 2019-05-26 NOTE — Plan of Care (Signed)

## 2019-05-26 NOTE — Progress Notes (Signed)
Subjective No acute events. Feeling well. Having gas/BM. Denies n/v. NG with minimal output. Abd xr shows contrast in colon  Objective: Vital signs in last 24 hours: Temp:  [97.8 F (36.6 C)-98.4 F (36.9 C)] 98.4 F (36.9 C) (09/02 0622) Pulse Rate:  [60-73] 63 (09/02 0622) Resp:  [14-18] 16 (09/02 0622) BP: (119-140)/(76-87) 139/83 (09/02 0622) SpO2:  [94 %-99 %] 99 % (09/02 0622) Weight:  [53.5 kg] 53.5 kg (09/02 0622) Last BM Date: 05/25/19  Intake/Output from previous day: 09/01 0701 - 09/02 0700 In: 599.9 [I.V.:579.9; NG/GT:20] Out: -  Intake/Output this shift: No intake/output data recorded.  Gen: NAD, comfortable CV: RRR Pulm: Normal work of breathing Abd: Soft, NT/ND Ext: SCDs in place  Lab Results: CBC  Recent Labs    05/25/19 0734 05/26/19 0440  WBC 6.4 4.3  HGB 11.4* 10.4*  HCT 35.4* 33.3*  PLT 150 128*   BMET Recent Labs    05/25/19 0734 05/26/19 0440  NA 139 143  K 3.3* 3.5  CL 107 111  CO2 23 24  GLUCOSE 104* 117*  BUN 13 11  CREATININE 0.59 0.61  CALCIUM 8.4* 8.4*   PT/INR No results for input(s): LABPROT, INR in the last 72 hours. ABG No results for input(s): PHART, HCO3 in the last 72 hours.  Invalid input(s): PCO2, PO2  Studies/Results:  Anti-infectives: Anti-infectives (From admission, onward)   None       Assessment/Plan: Patient Active Problem List   Diagnosis Date Noted  . SBO (small bowel obstruction) ([place]) 05/25/2019  . Essential hypertension 05/25/2019   -Ok to remove NG tube -Start clears; advance as tolerated   LOS: 1 day   Sharon Mt. Dema Severin, M.D. [facility]

## 2019-05-26 NOTE — Progress Notes (Signed)
PROGRESS NOTE  LIMA SQUARE [id number] DOB: 20-Sep-1956 DOA: 05/24/2019 PCP: Alroy Dust, L.Marlou Sa, MD  Brief summary: 63 yo with hx of rectal cancer s/p low anterior resection in 2009 followed by [facility] GI and Dr. Osker Mason presented with abdominal pain, nausea, vomiting.   CT scan shows SBO with transition point within the L lower mid abdomen.  CT with findings concerning for early pyelo, but UA without LE or nitrites, low suspicion   HPI/Recap of past 24 hours:  She is anxious, but glad the ng tube is removed, she currently denies  Ab pain, no n/v, reports bm last night,   C/o feeling indigestion, wants home meds omeprazole restarted   Assessment/Plan: Principal Problem:   SBO (small bowel obstruction) ([place]) Active Problems:   Essential hypertension  Sbo: improving, ng removed, currently on clears, will follow general surgery recommendation  Hypokalemia: replace k prn, keep K>4, check mag Tele unremarkable, patient is improving, will d/c tele  HTN: home oral meds held due to npo, on prn labetalol   Anemia of chronic disease, hgb stable at baseline around 10    DVT Prophylaxis:lovenox  Code Status: full  Family Communication: patient   Disposition Plan: ambulate, home possible tomorrow if able to tolerate diet advancement and cleared by general surgery  Tele unremarkable, patient is improving, will d/c tele Consultants:  General surgery  Procedures:  Ng placement and removal   Antibiotics:  none   Objective: BP 139/83 (BP Location: Left Arm)   Pulse 63   Temp 98.4 F (36.9 C) (Oral)   Resp 16   Ht 5\' 5"  (1.651 m)   Wt 53.5 kg   SpO2 99%   BMI 19.63 kg/m   Intake/Output Summary (Last 24 hours) at 05/26/2019 1129 Last data filed at 05/26/2019 0500 Gross per 24 hour  Intake 599.94 ml  Output -  Net 599.94 ml   Filed Weights   05/24/19 1334 05/26/19 0622  Weight: 54.9 kg 53.5 kg    Exam: Patient is examined daily including today on 05/26/2019,  exams remain the same as of yesterday except that has changed    General:  NAD  Cardiovascular: RRR  Respiratory: CTABL  Abdomen: Soft/ND/NT, positive BS  Musculoskeletal: No Edema  Neuro: alert, oriented   Data Reviewed: Basic Metabolic Panel: Recent Labs  Lab 05/24/19 1533 05/25/19 0734 05/26/19 0440  NA 140 139 143  K 3.8 3.3* 3.5  CL 106 107 111  CO2 23 23 24   GLUCOSE 113* 104* 117*  BUN 12 13 11   CREATININE 0.63 0.59 0.61  CALCIUM 9.4 8.4* 8.4*  MG  --   --  1.9   Liver Function Tests: Recent Labs  Lab 05/24/19 1533 05/25/19 0734 05/26/19 0440  AST 16 15 13*  ALT 13 11 12   ALKPHOS 61 51 44  BILITOT 1.0 0.8 0.8  PROT 7.8 7.0 6.1*  ALBUMIN 4.4 3.8 3.3*   Recent Labs  Lab 05/24/19 1533  LIPASE 23   No results for input(s): AMMONIA in the last 168 hours. CBC: Recent Labs  Lab 05/24/19 1533 05/25/19 0734 05/26/19 0440  WBC 6.9 6.4 4.3  NEUTROABS  --  4.9  --   HGB 13.1 11.4* 10.4*  HCT 41.3 35.4* 33.3*  MCV 92.8 92.9 95.1  PLT 185 150 128*   Cardiac Enzymes:   No results for input(s): CKTOTAL, CKMB, CKMBINDEX, TROPONINI in the last 168 hours. BNP (last 3 results) No results for input(s): BNP in the last 8760 hours.  ProBNP (last 3 results) No results for input(s): PROBNP in the last 8760 hours.  CBG: Recent Labs  Lab 05/25/19 1440 05/25/19 2349 05/26/19 1112  GLUCAP 92 97 122*    Recent Results (from the past 240 hour(s))  Urine culture     Status: Abnormal   Collection Time: 05/24/19  3:34 PM   Specimen: Urine, Clean Catch  Result Value Ref Range Status   Specimen Description   Final    URINE, CLEAN CATCH Performed at [facility], [place] [address] [place], [place] [address]    Special Requests   Final    NONE Performed at [facility], [place] [address] [place], [place] [address]    Culture (A)  Final    <10,000 COLONIES/mL INSIGNIFICANT GROWTH Performed at [place] [address] [place], [place] [address]    Report Status 05/26/2019 FINAL  Final  SARS Coronavirus 2 [facility] order, Performed in [place] hospital lab)     Status: None   Collection Time: 05/25/19  3:39 AM  Result Value Ref Range Status   SARS Coronavirus 2 NEGATIVE NEGATIVE Final    Comment: (NOTE) If result is NEGATIVE SARS-CoV-2 target nucleic acids are NOT DETECTED. The SARS-CoV-2 RNA is generally detectable in upper and lower  respiratory specimens during the acute phase of infection. The lowest  concentration of SARS-CoV-2 viral copies this assay can detect is 250  copies / mL. A negative result does not preclude SARS-CoV-2 infection  and should not be used as the sole basis for treatment or other  patient management decisions.  A negative result may occur with  improper specimen collection / handling, submission of specimen other  than nasopharyngeal swab, presence of viral mutation(s) within the  areas targeted by this assay, and inadequate number of viral copies  (<250 copies / mL). A negative result must be combined with clinical  observations, patient history, and epidemiological information. If result is POSITIVE SARS-CoV-2 target nucleic acids are DETECTED. The SARS-CoV-2 RNA is generally detectable in upper and lower  respiratory specimens dur ing the acute phase of infection.  Positive  results are indicative of active infection with SARS-CoV-2.  Clinical  correlation with patient history and other diagnostic information is  necessary to determine patient infection status.  Positive results do  not rule out bacterial infection or co-infection with other viruses. If result is PRESUMPTIVE POSTIVE SARS-CoV-2 nucleic acids MAY BE PRESENT.   A presumptive positive result was obtained on the submitted specimen  and confirmed on repeat testing.  While 2019 novel coronavirus  (SARS-CoV-2) nucleic acids may be present in the submitted sample  additional confirmatory  testing may be necessary for epidemiological  and / or clinical management purposes  to differentiate between  SARS-CoV-2 and other Sarbecovirus currently known to infect humans.  If clinically indicated additional testing with an alternate test  methodology [phone]) is advised. The SARS-CoV-2 RNA is generally  detectable in upper and lower respiratory sp ecimens during the acute  phase of infection. The expected result is Negative. Fact Sheet for Patients:  StrictlyIdeas.no Fact Sheet for Healthcare Providers: BankingDealers.co.za This test is not yet approved or cleared by the [place] FDA and has been authorized for detection and/or diagnosis of SARS-CoV-2 by FDA under an Emergency Use Authorization (EUA).  This EUA will remain in effect (meaning this test can be used) for the duration of the COVID-19 declaration under Section 564(b)(1) of the Act, 21 U.S.C. section  360bbb-3(b)(1), unless the authorization is terminated or revoked sooner. Performed at [facility], [place] [address], [place], [place] [address]      Studies: Dg Abd Portable 1v-small Bowel Obstruction Protocol-initial, 8 Hr Delay  Result Date: 05/25/2019 CLINICAL DATA:  Small bowel obstruction, 8 hour delayed film. EXAM: PORTABLE ABDOMEN - 1 VIEW COMPARISON:  Radiographs and CT earlier this day. FINDINGS: Administered enteric contrast within the ascending, transverse, and descending colon. Persistent but decreased small bowel dilatation in the central abdomen. Enteric tube with tip and side-port in the stomach, slightly kinked at the side-port. No evidence of free air. IMPRESSION: Administered enteric contrast within the colon. Persistent but decreased small bowel dilatation in the central abdomen consistent with resolving partial small bowel obstruction. Electronically Signed   By: Keith Rake M.D.   On: 05/25/2019 23:13    Scheduled Meds: .  enoxaparin (LOVENOX) injection  40 mg Subcutaneous Q24H  . sodium chloride flush  3 mL Intravenous Once    Continuous Infusions:   Time spent: 15mins I have personally reviewed and interpreted on  05/26/2019 daily labs, tele strips, imagings as discussed above under date review session and assessment and plans.  I reviewed all nursing notes, pharmacy notes, consultant notes,  vitals, pertinent old records  I have discussed plan of care as described above with RN , patient  on 05/26/2019   Florencia Reasons MD, PhD, FACP  [facility] Pager [phone]. If 7PM-7AM, please contact night-coverage at [email or web address], password [facility] 05/26/2019, 11:29 AM  LOS: 1 day

## 2019-05-26 NOTE — Progress Notes (Signed)
Patient has had little out put from G tube. NG tube has been flushed twice. Less then 50MLs of yellow/green bile has came out of NG tube. NG tube is still in same place and does not show evidence of movement. Patient has no complaints at this time. I will continue to monitor closely.

## 2019-05-27 LAB — CBC WITH DIFFERENTIAL/PLATELET
Abs Immature Granulocytes: 0.01 10*3/uL (ref 0.00–0.07)
Basophils Absolute: 0 10*3/uL (ref 0.0–0.1)
Basophils Relative: 1 %
Eosinophils Absolute: 0.1 10*3/uL (ref 0.0–0.5)
Eosinophils Relative: 1 %
HCT: 32.9 % — ABNORMAL LOW (ref 36.0–46.0)
Hemoglobin: 10.4 g/dL — ABNORMAL LOW (ref 12.0–15.0)
Immature Granulocytes: 0 %
Lymphocytes Relative: 23 %
Lymphs Abs: 1.1 10*3/uL (ref 0.7–4.0)
MCH: 29.4 pg (ref 26.0–34.0)
MCHC: 31.6 g/dL (ref 30.0–36.0)
MCV: 92.9 fL (ref 80.0–100.0)
Monocytes Absolute: 0.5 10*3/uL (ref 0.1–1.0)
Monocytes Relative: 11 %
Neutro Abs: 3.2 10*3/uL (ref 1.7–7.7)
Neutrophils Relative %: 64 %
Platelets: 136 10*3/uL — ABNORMAL LOW (ref 150–400)
RBC: 3.54 MIL/uL — ABNORMAL LOW (ref 3.87–5.11)
RDW: 13.2 % (ref 11.5–15.5)
WBC: 5 10*3/uL (ref 4.0–10.5)
nRBC: 0 % (ref 0.0–0.2)

## 2019-05-27 LAB — BASIC METABOLIC PANEL
Anion gap: 11 (ref 5–15)
BUN: <5 mg/dL — ABNORMAL LOW (ref 8–23)
CO2: 24 mmol/L (ref 22–32)
Calcium: 8.7 mg/dL — ABNORMAL LOW (ref 8.9–10.3)
Chloride: 105 mmol/L (ref 98–111)
Creatinine, Ser: 0.5 mg/dL (ref 0.44–1.00)
GFR calc Af Amer: >60 mL/min (ref >60)
GFR calc non Af Amer: >60 mL/min (ref >60)
Glucose, Bld: 103 mg/dL — ABNORMAL HIGH (ref 70–99)
Potassium: 3 mmol/L — ABNORMAL LOW (ref 3.5–5.1)
Sodium: 140 mmol/L (ref 135–145)

## 2019-05-27 LAB — MAGNESIUM: Magnesium: 1.8 mg/dL (ref 1.7–2.4)

## 2019-05-27 LAB — GLUCOSE, CAPILLARY
Glucose-Capillary: 105 mg/dL — ABNORMAL HIGH (ref 70–99)
Glucose-Capillary: 109 mg/dL — ABNORMAL HIGH (ref 70–99)

## 2019-05-27 MED ORDER — POTASSIUM CHLORIDE CRYS ER 20 MEQ PO TBCR
40.0000 meq | EXTENDED_RELEASE_TABLET | ORAL | Status: AC
  Administered 2019-05-27 (×2): 40 meq via ORAL
  Filled 2019-05-27 (×2): qty 2

## 2019-05-27 MED ORDER — MAGNESIUM OXIDE 400 MG PO CAPS: 400.0000 mg | ORAL_CAPSULE | Freq: Every day | ORAL | 0 refills | Status: DC

## 2019-05-27 MED ORDER — POTASSIUM CHLORIDE CRYS ER 20 MEQ PO TBCR: 40.0000 meq | EXTENDED_RELEASE_TABLET | ORAL | 0 refills | Status: DC

## 2019-05-27 MED ORDER — FERROUS SULFATE 325 (65 FE) MG PO TBEC: 325.0000 mg | DELAYED_RELEASE_TABLET | ORAL | 0 refills | Status: AC

## 2019-05-27 NOTE — Discharge Summary (Signed)
Discharge Summary  Tiffany Walsh [id number] DOB: [birth year]  PCP: Alroy Dust, L.Marlou Sa, MD  Admit date: 05/24/2019 Discharge date: 05/27/2019  Time spent: 32mins  Recommendations for Outpatient Follow-up:  1. F/u with PCP within a week  for hospital discharge follow up, repeat cbc/bmp at follow up  Discharge Diagnoses:  Active Hospital Problems   Diagnosis Date Noted   SBO (small bowel obstruction) ([place]) 05/25/2019   Hypokalemia    Essential hypertension 05/25/2019    Resolved Hospital Problems  No resolved problems to display.    Discharge Condition: stable  Diet recommendation: regular diet  Filed Weights   05/24/19 1334 05/26/19 0622 05/27/19 0551  Weight: 54.9 kg 53.5 kg 52.8 kg    History of present illness: (per admitting MD Dr Hal Hope) PCP: Alroy Dust, L.Marlou Sa, MD   Patient coming from: Home.  Chief Complaint: Abdominal pain nausea vomiting.  HPI: Tiffany Walsh is a 63 y.o. female with history of rectal cancer status post low anterior resection in 2009 being followed by [facility] and Dr. Alen Blew oncologist presents to the ER with complaints of abdominal pain and nausea vomiting since yesterday morning.  Had at least 4 episodes of vomiting denies any blood in the vomitus.  Patient states on Sunday 2 days ago patient had diarrheal episodes.  Last bowel movement was more than 48 hours ago.  Due to persistent vomiting patient came to the ER.  Denies chest pain shortness of breath productive cough fever chills.  ED Course: In the ER patient CT scan abdomen pelvis shows small bowel obstruction with transition point NG tube was placed.  Patient is being admitted for small bowel obstruction.  Hospital Course:  Principal Problem:   SBO (small bowel obstruction) ([facility]) Active Problems:   Essential hypertension   Hypokalemia   SBO:  Resolved,  ng removed, tolerating diet advancement,  general surgery input appreciated, she is cleared to discharge home per general  surgery  recommendation  Hypokalemia:  replace k prn, keep K>4,   mag 1.8 Tele unremarkable Repeat lab at discharge follow up  HTN: home oral meds verapamil and betablocker restarted    Anemia of chronic disease, hgb stable at baseline around 10  history of rectal cancer status post low anterior resection in 2009 being followed by [facility] and Dr. Alen Blew oncologist  DVT Prophylaxis while in the hospital :lovenox  Code Status: full  Family Communication: patient     Procedures:  Ng placement and removal   Consultations:  General surgery  Discharge Exam: BP (!) 132/99 (BP Location: Right Arm)    Pulse 88    Temp 98.9 F (37.2 C)    Resp 18    Ht 5\' 5"  (1.651 m)    Wt 52.8 kg    SpO2 100%    BMI 19.37 kg/m   General: NAD Cardiovascular: RRR Respiratory: CTABL  Discharge Instructions You were cared for by a hospitalist during your hospital stay. If you have any questions about your discharge medications or the care you received while you were in the hospital after you are discharged, you can call the unit and asked to speak with the hospitalist on call if the hospitalist that took care of you is not available. Once you are discharged, your primary care physician will handle any further medical issues. Please note that NO REFILLS for any discharge medications will be authorized once you are discharged, as it is imperative that you return to your primary care physician (or establish a relationship with a  primary care physician if you do not have one) for your aftercare needs so that they can reassess your need for medications and monitor your lab values.  Discharge Instructions    Diet general   Complete by: As directed    Soft diet for the next few days, advance diet as tolerated to regular diet   Increase activity slowly   Complete by: As directed      Allergies as of 05/27/2019   No Known Allergies     Medication List    STOP taking these medications     ibuprofen 600 MG tablet Commonly known as: ADVIL     TAKE these medications   docusate sodium 100 MG capsule Commonly known as: COLACE Take 100 mg by mouth daily.   ferrous sulfate 325 (65 FE) MG EC tablet Take 1 tablet (325 mg total) by mouth every Monday, Wednesday, and Friday. Take one tablet twice a day. Start taking on: May 28, 2019 What changed:   how much to take  how to take this  when to take this   Magnesium Oxide 400 MG Caps Take 1 capsule (400 mg total) by mouth at bedtime.   metoprolol succinate 100 MG 24 hr tablet Commonly known as: TOPROL-XL Take 100 mg by mouth daily. Take with or immediately following a meal.   omeprazole 40 MG capsule Commonly known as: PRILOSEC Take 40 mg by mouth daily.   potassium chloride SA 20 MEQ tablet Commonly known as: K-DUR Take 2 tablets (40 mEq total) by mouth every Monday, Wednesday, and Friday. Start taking on: May 28, 2019   verapamil 240 MG CR tablet Commonly known as: CALAN-SR Take 240 mg by mouth daily.      No Known Allergies Follow-up Information    Alroy Dust, L.Marlou Sa, MD Follow up in 1 week(s).   Specialty: Family Medicine Why: hospital discharge follow up, repeat cbc/bmp at follow up Contact information: [address] [place] [address] [phone]            The results of significant diagnostics from this hospitalization (including imaging, microbiology, ancillary and laboratory) are listed below for reference.    Significant Diagnostic Studies: Dg Abdomen 1 View  Result Date: 05/25/2019 CLINICAL DATA:  NG tube position. EXAM: ABDOMEN - 1 VIEW COMPARISON:  CT abdomen and pelvis 05/25/2019 FINDINGS: Dilated loops of small bowel are again noted in the left upper quadrant. Side port of the NG tube is in the fundus the stomach. Contrast is noted within the kidneys. IMPRESSION: Side port of the NG tube is in the fundus of the stomach. Electronically Signed   By:  San Morelle M.D.   On: 05/25/2019 04:41   Ct Abdomen Pelvis W Contrast  Result Date: 05/25/2019 CLINICAL DATA:  Initial evaluation for acute abdominal pain, nausea, vomiting. EXAM: CT ABDOMEN AND PELVIS WITH CONTRAST TECHNIQUE: Multidetector CT imaging of the abdomen and pelvis was performed using the standard protocol following bolus administration of intravenous contrast. CONTRAST:  62mL OMNIPAQUE IOHEXOL 300 MG/ML  SOLN COMPARISON:  Prior CT from 11/01/2014 FINDINGS: Lower chest: Mild scattered subsegmental atelectatic changes seen within the visualized lung bases. Visualized lungs are otherwise clear. Hepatobiliary: 6.0 x 5.1 cm well-circumscribed simple cyst present within the posterior right hepatic lobe. Few additional scattered subcentimeter hypodensities too small the characterize, but suspected to reflect small cysts as well. Focal fat deposition noted adjacent to the falciform ligament. Gallbladder within normal limits. No biliary dilatation. Pancreas: Pancreas within normal limits.  Spleen: Spleen within normal limits. Adrenals/Urinary Tract: Adrenal glands are normal. Kidneys equal in size. Mildly heterogeneous nephrogram seen within the kidneys bilaterally, most notable at the upper pole of the right kidney. While this finding is suspected to be due to scattered areas of cortical thinning, possible early changes of pyelonephritis could also conceivably have this appearance. No significant perinephric fat stranding. Superimposed subcentimeter hypodensity within the upper pole the right kidney too small the characterize, but statistically likely reflects a small cyst. No nephrolithiasis or hydronephrosis. No focal enhancing renal mass. No hydroureter. Partially distended bladder within normal limits. Stomach/Bowel: Stomach within normal limits. Multiple prominent dilated loops of small bowel seen within the left and central abdomen, measuring up to 3 cm in diameter. Associated internal  air-fluid levels. There is an apparent transition point within the left lower mid abdomen (series 5, image 37). Small bowel is largely decompressed distally. Finding consistent with small bowel obstruction. Associated scattered small volume free fluid within the pelvis, likely reactive. No pneumatosis or portal venous gas. Anastomotic suture lines noted at the right colon and rectosigmoid junction. Colon largely decompressed without acute finding. Vascular/Lymphatic: Normal intravascular enhancement seen throughout the intra-abdominal aorta. Mild aorto bi-iliac atherosclerotic disease. No aneurysm. Mesenteric vessels patent proximally. No adenopathy. Reproductive: Uterus and ovaries within normal limits. Mild congestion of the pelvic vasculature within the left adnexa noted. Other: No free intraperitoneal air. Musculoskeletal: No acute osseous finding. No discrete lytic or blastic osseous lesions. Chronic grade 1 facet mediated anterolisthesis of L4 on L5 noted. IMPRESSION: 1. Findings consistent with small bowel obstruction with transition point within the left lower mid abdomen as above. Underlying adhesive disease is suspected. 2. Mildly heterogeneous nephrogram within the kidneys bilaterally, most notable at the upper pole of the right kidney. While this finding is suspected to be due to scattered areas of renal cortical thinning, possible early changes of pyelonephritis could also have this appearance. Correlation with urinalysis recommended. 3. Small volume free fluid within the pelvis, likely reactive. Electronically Signed   By: Jeannine Boga M.D.   On: 05/25/2019 03:04   Dg Abd Portable 1v-small Bowel Obstruction Protocol-initial, 8 Hr Delay  Result Date: 05/25/2019 CLINICAL DATA:  Small bowel obstruction, 8 hour delayed film. EXAM: PORTABLE ABDOMEN - 1 VIEW COMPARISON:  Radiographs and CT earlier this day. FINDINGS: Administered enteric contrast within the ascending, transverse, and descending  colon. Persistent but decreased small bowel dilatation in the central abdomen. Enteric tube with tip and side-port in the stomach, slightly kinked at the side-port. No evidence of free air. IMPRESSION: Administered enteric contrast within the colon. Persistent but decreased small bowel dilatation in the central abdomen consistent with resolving partial small bowel obstruction. Electronically Signed   By: Keith Rake M.D.   On: 05/25/2019 23:13    Microbiology: Recent Results (from the past 240 hour(s))  Urine culture     Status: Abnormal   Collection Time: 05/24/19  3:34 PM   Specimen: Urine, Clean Catch  Result Value Ref Range Status   Specimen Description   Final    URINE, CLEAN CATCH Performed at [facility], [place] [address] [place], [place] [address]    Special Requests   Final    NONE Performed at [facility], [place] [address], [place], [place] [address]    Culture (A)  Final    <10,000 COLONIES/mL INSIGNIFICANT GROWTH Performed at [place] [address] [place], [place] [address]    Report Status 05/26/2019  FINAL  Final  SARS Coronavirus 2 [facility] order, Performed in [place] hospital lab)     Status: None   Collection Time: 05/25/19  3:39 AM  Result Value Ref Range Status   SARS Coronavirus 2 NEGATIVE NEGATIVE Final    Comment: (NOTE) If result is NEGATIVE SARS-CoV-2 target nucleic acids are NOT DETECTED. The SARS-CoV-2 RNA is generally detectable in upper and lower  respiratory specimens during the acute phase of infection. The lowest  concentration of SARS-CoV-2 viral copies this assay can detect is 250  copies / mL. A negative result does not preclude SARS-CoV-2 infection  and should not be used as the sole basis for treatment or other  patient management decisions.  A negative result may occur with  improper specimen collection / handling, submission of specimen other  than nasopharyngeal swab, presence of  viral mutation(s) within the  areas targeted by this assay, and inadequate number of viral copies  (<250 copies / mL). A negative result must be combined with clinical  observations, patient history, and epidemiological information. If result is POSITIVE SARS-CoV-2 target nucleic acids are DETECTED. The SARS-CoV-2 RNA is generally detectable in upper and lower  respiratory specimens dur ing the acute phase of infection.  Positive  results are indicative of active infection with SARS-CoV-2.  Clinical  correlation with patient history and other diagnostic information is  necessary to determine patient infection status.  Positive results do  not rule out bacterial infection or co-infection with other viruses. If result is PRESUMPTIVE POSTIVE SARS-CoV-2 nucleic acids MAY BE PRESENT.   A presumptive positive result was obtained on the submitted specimen  and confirmed on repeat testing.  While 2019 novel coronavirus  (SARS-CoV-2) nucleic acids may be present in the submitted sample  additional confirmatory testing may be necessary for epidemiological  and / or clinical management purposes  to differentiate between  SARS-CoV-2 and other Sarbecovirus currently known to infect humans.  If clinically indicated additional testing with an alternate test  methodology [phone]) is advised. The SARS-CoV-2 RNA is generally  detectable in upper and lower respiratory sp ecimens during the acute  phase of infection. The expected result is Negative. Fact Sheet for Patients:  StrictlyIdeas.no Fact Sheet for Healthcare Providers: BankingDealers.co.za This test is not yet approved or cleared by the [place] FDA and has been authorized for detection and/or diagnosis of SARS-CoV-2 by FDA under an Emergency Use Authorization (EUA).  This EUA will remain in effect (meaning this test can be used) for the duration of the COVID-19 declaration under Section  564(b)(1) of the Act, 21 U.S.C. section 360bbb-3(b)(1), unless the authorization is terminated or revoked sooner. Performed at [facility], [place] [address] [place], [place] [address]      Labs: Basic Metabolic Panel: Recent Labs  Lab 05/24/19 1533 05/25/19 0734 05/26/19 0440 05/27/19 0451  NA 140 139 143 140  K 3.8 3.3* 3.5 3.0*  CL 106 107 111 105  CO2 23 23 24 24   GLUCOSE 113* 104* 117* 103*  BUN 12 13 11  <5*  CREATININE 0.63 0.59 0.61 0.50  CALCIUM 9.4 8.4* 8.4* 8.7*  MG  --   --  1.9 1.8   Liver Function Tests: Recent Labs  Lab 05/24/19 1533 05/25/19 0734 05/26/19 0440  AST 16 15 13*  ALT 13 11 12   ALKPHOS 61 51 44  BILITOT 1.0 0.8 0.8  PROT 7.8 7.0 6.1*  ALBUMIN 4.4 3.8 3.3*   Recent Labs  Lab 05/24/19 1533  LIPASE 23   No results for input(s): AMMONIA in the last 168 hours. CBC: Recent Labs  Lab 05/24/19 1533 05/25/19 0734 05/26/19 0440 05/27/19 0451  WBC 6.9 6.4 4.3 5.0  NEUTROABS  --  4.9  --  3.2  HGB 13.1 11.4* 10.4* 10.4*  HCT 41.3 35.4* 33.3* 32.9*  MCV 92.8 92.9 95.1 92.9  PLT 185 150 128* 136*   Cardiac Enzymes: No results for input(s): CKTOTAL, CKMB, CKMBINDEX, TROPONINI in the last 168 hours. BNP: BNP (last 3 results) No results for input(s): BNP in the last 8760 hours.  ProBNP (last 3 results) No results for input(s): PROBNP in the last 8760 hours.  CBG: Recent Labs  Lab 05/25/19 2349 05/26/19 1112 05/26/19 1731 05/27/19 0008 05/27/19 0555  GLUCAP 97 122* 92 109* 105*       Signed:  Florencia Reasons MD, PhD, FACP  [facility] 05/27/2019, 11:30 AM

## 2019-05-27 NOTE — Progress Notes (Signed)
Subjective No acute events. Feeling well. Having gas/BM. Denies n/v on full liquids.  Objective: Vital signs in last 24 hours: Temp:  [98.3 F (36.8 C)-99.4 F (37.4 C)] 98.9 F (37.2 C) (09/03 0551) Pulse Rate:  [63-88] 88 (09/03 0551) Resp:  [16-18] 18 (09/03 0551) BP: (132-153)/(85-99) 132/99 (09/03 0551) SpO2:  [100 %] 100 % (09/03 0551) Weight:  [52.8 kg] 52.8 kg (09/03 0551) Last BM Date: 05/25/19  Intake/Output from previous day: No intake/output data recorded. Intake/Output this shift: No intake/output data recorded.  Gen: NAD, comfortable CV: RRR Pulm: Normal work of breathing Abd: Soft, NT/ND Ext: SCDs in place  Lab Results: CBC  Recent Labs    05/26/19 0440 05/27/19 0451  WBC 4.3 5.0  HGB 10.4* 10.4*  HCT 33.3* 32.9*  PLT 128* 136*   BMET Recent Labs    05/26/19 0440 05/27/19 0451  NA 143 140  K 3.5 3.0*  CL 111 105  CO2 24 24  GLUCOSE 117* 103*  BUN 11 <5*  CREATININE 0.61 0.50  CALCIUM 8.4* 8.7*   PT/INR No results for input(s): LABPROT, INR in the last 72 hours. ABG No results for input(s): PHART, HCO3 in the last 72 hours.  Invalid input(s): PCO2, PO2  Studies/Results:  Anti-infectives: Anti-infectives (From admission, onward)   None       Assessment/Plan: Patient Active Problem List   Diagnosis Date Noted  . Hypokalemia   . SBO (small bowel obstruction) ([place]) 05/25/2019  . Essential hypertension 05/25/2019   -SBO appears to have resolved -Advance to soft diet; ok for discharge later today if she tolerates this without issue   LOS: 2 days   Sharon Mt. Dema Severin, M.D. [facility]

## 2019-06-01 DIAGNOSIS — D649 Anemia, unspecified: Secondary | ICD-10-CM | POA: Diagnosis not present

## 2019-06-01 DIAGNOSIS — E876 Hypokalemia: Secondary | ICD-10-CM | POA: Diagnosis not present

## 2019-06-01 DIAGNOSIS — K56609 Unspecified intestinal obstruction, unspecified as to partial versus complete obstruction: Secondary | ICD-10-CM | POA: Diagnosis not present

## 2019-06-01 DIAGNOSIS — Z23 Encounter for immunization: Secondary | ICD-10-CM | POA: Diagnosis not present

## 2019-06-23 ENCOUNTER — Other Ambulatory Visit: Payer: Self-pay | Admitting: Physician Assistant

## 2019-06-23 DIAGNOSIS — R1013 Epigastric pain: Secondary | ICD-10-CM

## 2019-06-23 DIAGNOSIS — R112 Nausea with vomiting, unspecified: Secondary | ICD-10-CM | POA: Diagnosis not present

## 2019-06-23 DIAGNOSIS — K219 Gastro-esophageal reflux disease without esophagitis: Secondary | ICD-10-CM | POA: Diagnosis not present

## 2019-07-07 ENCOUNTER — Other Ambulatory Visit: Payer: Self-pay

## 2019-07-07 ENCOUNTER — Encounter (HOSPITAL_COMMUNITY)
Admission: RE | Admit: 2019-07-07 | Discharge: 2019-07-07 | Disposition: A | Discharge status: Home / Self Care | Payer: BC Managed Care – PPO | Source: Ambulatory Visit | Attending: Physician Assistant | Admitting: Physician Assistant

## 2019-07-07 DIAGNOSIS — R112 Nausea with vomiting, unspecified: Secondary | ICD-10-CM | POA: Diagnosis not present

## 2019-07-07 DIAGNOSIS — R1013 Epigastric pain: Secondary | ICD-10-CM | POA: Diagnosis not present

## 2019-07-07 MED ORDER — TECHNETIUM TC 99M MEBROFENIN IV KIT
5.5000 | PACK | Freq: Once | INTRAVENOUS | Status: AC | PRN
  Administered 2019-07-07: 10:00:00 5.5 via INTRAVENOUS

## 2019-07-07 NOTE — Progress Notes (Signed)
Patient no longer needs PIV at this time  

## 2019-07-21 DIAGNOSIS — K219 Gastro-esophageal reflux disease without esophagitis: Secondary | ICD-10-CM | POA: Diagnosis not present

## 2019-10-05 ENCOUNTER — Other Ambulatory Visit (HOSPITAL_COMMUNITY)
Admission: RE | Admit: 2019-10-05 | Discharge: 2019-10-05 | Disposition: A | Discharge status: Home / Self Care | Payer: 59 | Source: Ambulatory Visit | Attending: Family Medicine | Admitting: Family Medicine

## 2019-10-05 ENCOUNTER — Other Ambulatory Visit: Payer: Self-pay | Admitting: Family Medicine

## 2019-10-05 DIAGNOSIS — Z124 Encounter for screening for malignant neoplasm of cervix: Secondary | ICD-10-CM | POA: Insufficient documentation

## 2019-10-07 LAB — CYTOLOGY - PAP: Diagnosis: NEGATIVE

## 2019-11-27 ENCOUNTER — Ambulatory Visit: Payer: 59 | Attending: Internal Medicine

## 2019-11-27 DIAGNOSIS — Z23 Encounter for immunization: Secondary | ICD-10-CM | POA: Insufficient documentation

## 2019-11-27 NOTE — Progress Notes (Signed)
   Covid-19 Vaccination Clinic  Name:  Tiffany Walsh    MRN: [id number] DOB: August 12, 1956  11/27/2019  Tiffany Walsh was observed post Covid-19 immunization for 15 minutes without incident. She was provided with Vaccine Information Sheet and instruction to access the V-Safe system.   Tiffany Walsh was instructed to call 911 with any severe reactions post vaccine: [name] Kitchen Difficulty breathing  . Swelling of face and throat  . A fast heartbeat  . A bad rash all over body  . Dizziness and weakness   Immunizations Administered    Name Date Dose VIS Date Route   Pfizer COVID-19 Vaccine 11/27/2019  8:45 AM 0.3 mL 09/03/2019 Intramuscular   Manufacturer: [place]   Lot: [id number]   [place]: [id number]

## 2019-12-18 ENCOUNTER — Ambulatory Visit: Payer: 59 | Attending: Internal Medicine

## 2019-12-18 DIAGNOSIS — Z23 Encounter for immunization: Secondary | ICD-10-CM

## 2019-12-18 NOTE — Progress Notes (Signed)
   Covid-19 Vaccination Clinic  Name:  Tiffany Walsh    MRN: [id number] DOB: Mar 05, 1956  12/18/2019  [name] was observed post Covid-19 immunization for 15 minutes without incident. She was provided with Vaccine Information Sheet and instruction to access the V-Safe system.   [name] was instructed to call 911 with any severe reactions post vaccine: [name] Kitchen Difficulty breathing  . Swelling of face and throat  . A fast heartbeat  . A bad rash all over body  . Dizziness and weakness   Immunizations Administered    Name Date Dose VIS Date Route   Pfizer COVID-19 Vaccine 12/18/2019  8:56 AM 0.3 mL 09/03/2019 Intramuscular   Manufacturer: [place]   Lot: [id number]   [place]: [id number]

## 2020-04-23 NOTE — Progress Notes (Signed)
Cardiology Office Note:    Date:  04/25/2020   ID:  Tiffany Walsh, DOB 04/14/1956, MRN [id number]  PCP:  Mitchell, L.Dean, MD  [facility] Cardiologist:  No primary care provider on file.  [facility] Electrophysiologist:  None   Referring MD: Mitchell, L.Dean, MD    History of Present Illness:    Tiffany Walsh is a 64 y.o. female here for evaluation of chest pain at the request of Dr. Dean Mitchell  Dale husbang  Chest discomfort was noted with intermediate pain  HDL 78 LDL 109  EKG from outside office personally reviewed and interpreted shows sinus rhythm with nonspecific ST-T wave changes heart rate 59 bpm.  Dull pain, comes ang go. Lasts a minutes. Can be at rest. On treadmill ok. Lifts mother wheelchair.   Quit tob 20 years.   Past Medical History:  Diagnosis Date  . Abdominal pain, LLQ   . Anemia   . Arthritis of right knee   . Cervical dysplasia   . CIN I (cervical intraepithelial neoplasia I)   . CIN II (cervical intraepithelial neoplasia II)   . Colon cancer (HCC)   . Epigastric pain   . GERD (gastroesophageal reflux disease)   . Heme positive stool   . History of rectal cancer   . Hx SBO   . Hypercholesterolemia   . Hypertension   . Malignant neoplasm of rectum, rectosigmoid junction, and anus   . Nausea with vomiting, unspecified   . Peripheral neuropathy due to chemotherapy (HCC)   . Rectal cancer (HCC)    stage 3  . Vaginal delivery 1973,1975    Past Surgical History:  Procedure Laterality Date  . CERVICAL CONIZATION W/BX N/A 04/04/2015   Procedure: CONIZATION CERVIX WITH BIOPSY;  Surgeon: Evelyn Varnado, MD;  Location: [facility];  Service: Gynecology;  Laterality: N/A;  . COLON SURGERY    . MOUTH SURGERY    . widom teeth       Current Medications: Current Meds  Medication Sig  . docusate sodium (COLACE) 100 MG capsule Take 100 mg by mouth daily.  . esomeprazole (NEXIUM) 40 MG capsule Take 40 mg by mouth daily at 12 noon.  . ferrous  sulfate 325 (65 FE) MG EC tablet Take 1 tablet (325 mg total) by mouth every Monday, Wednesday, and Friday. Take one tablet twice a day.  . Magnesium Oxide 400 MG CAPS Take 1 capsule (400 mg total) by mouth at bedtime.  . metoprolol succinate (TOPROL-XL) 100 MG 24 hr tablet Take 100 mg by mouth daily. Take with or immediately following a meal.  . verapamil (CALAN-SR) 240 MG CR tablet Take 240 mg by mouth daily.      Allergies:   Patient has no known allergies.   Social History   Socioeconomic History  . Marital status: Married    Spouse name: Not on file  . Number of children: Not on file  . Years of education: Not on file  . Highest education level: Not on file  Occupational History  . Not on file  Tobacco Use  . Smoking status: Former Smoker    Types: Cigarettes  . Smokeless tobacco: Never Used  Substance and Sexual Activity  . Alcohol use: Yes    Comment: occasional   . Drug use: No  . Sexual activity: Not on file  Other Topics Concern  . Not on file  Social History Narrative  . Not on file   Social Determinants of Health   Financial   Resource Strain:   . Difficulty of Paying Living Expenses:   Food Insecurity:   . Worried About Charity fundraiser in the Last Year:   . Arboriculturist in the Last Year:   Transportation Needs:   . Film/video editor (Medical):   [name] Kitchen Lack of Transportation (Non-Medical):   Physical Activity:   . Days of Exercise per Week:   . Minutes of Exercise per Session:   Stress:   . Feeling of Stress :   Social Connections:   . Frequency of Communication with Friends and Family:   . Frequency of Social Gatherings with Friends and Family:   . Attends Religious Services:   . Active Member of Clubs or Organizations:   . Attends Archivist Meetings:   [name] Kitchen Marital Status:      Family History: The patient's Family history is unknown by patient.  Mother did not have heart disease.  ROS:   Please see the history of present illness.     Denies any fevers chills nausea vomiting syncope bleeding.  Occasional heartburn noted.  All other systems reviewed and are negative.  EKGs/Labs/Other Studies Reviewed:    The following studies were reviewed today: EKG reviewed, outside office notes reviewed, outside labs reviewed  EKG: Sinus bradycardia 59 with nonspecific ST-T wave changes  Recent Labs: 05/26/2019: ALT 12 05/27/2019: BUN <5; Creatinine, Ser 0.50; Hemoglobin 10.4; Magnesium 1.8; Platelets 136; Potassium 3.0; Sodium 140  Recent Lipid Panel No results found for: CHOL, TRIG, HDL, CHOLHDL, VLDL, LDLCALC, LDLDIRECT  Physical Exam:    VS:  BP 140/70   Pulse 67   Ht 5' 5" (1.651 m)   Wt 117 lb (53.1 kg)   SpO2 94%   BMI 19.47 kg/m     Wt Readings from Last 3 Encounters:  04/25/20 117 lb (53.1 kg)  05/27/19 116 lb 6.5 oz (52.8 kg)  05/09/15 125 lb 9.6 oz (57 kg)     GEN: Thin in no acute distress HEENT: Normal NECK: No JVD; No carotid bruits LYMPHATICS: No lymphadenopathy CARDIAC: RRR, no murmurs, rubs, gallops RESPIRATORY:  Clear to auscultation without rales, wheezing or rhonchi  ABDOMEN: Soft, non-tender, non-distended MUSCULOSKELETAL:  No edema; No deformity  SKIN: Warm and dry NEUROLOGIC:  Alert and oriented x 3 PSYCHIATRIC:  Normal affect   ASSESSMENT:    1. Chest pain of uncertain etiology   2. Essential hypertension   3. Precordial pain    PLAN:    In order of problems listed above:  Chest discomfort with abnormal EKG, rest angina -Occurring at rest intermittent intermediate in severity.  No radiation of symptoms.  EKG shows nonspecific ST-T wave changes.  Former smoker, quit 20 years ago.  No diabetes, no early family history of coronary artery disease. -We will go ahead and pursue coronary CT scan with possible FFR analysis.  Based upon these findings, possibly recommend 81 mg of aspirin, statin etc. -An additional 50 of metoprolol tartrate would be helpful for her CT scan to ensure low  heart rate.  Essential hypertension -Has been on both metoprolol and verapamil.  Reasonably well controlled.  Could consider angiotensin receptor blocker in the future.  Last creatinine 0.73 hemoglobin 12.5 LDL 109 potassium 3.9 from outside labs.   Medication Adjustments/Labs and Tests Ordered: Current medicines are reviewed at length with the patient today.  Concerns regarding medicines are outlined above.  Orders Placed This Encounter  Procedures  . CT CORONARY MORPH W/CTA COR W/SCORE W/CA W/CM &/  OR WO/CM  . CT CORONARY FRACTIONAL FLOW RESERVE DATA PREP  . CT CORONARY FRACTIONAL FLOW RESERVE FLUID ANALYSIS   Meds ordered this encounter  Medications  . metoprolol tartrate (LOPRESSOR) 50 MG tablet    Sig: Take 1 tablet (50 mg total) by mouth once for 1 dose. Take 1 tablet 2 hours before your CT    Dispense:  1 tablet    Refill:  0    Patient Instructions  Medication Instructions:  The current medical regimen is effective;  continue present plan and medications.  *If you need a refill on your cardiac medications before your next appointment, please call your pharmacy*  Testing/Procedures: Your cardiac CT will be scheduled at one of the below locations:   [facility] [address] [place], [place] [address] [phone]  Please arrive at the [facility] main entrance of [facility] 30 minutes prior to test start time. Proceed to the [facility] Radiology Department (first floor) to check-in and test prep.  Please follow these instructions carefully (unless otherwise directed):  On the Night Before the Test: . Be sure to Drink plenty of water. . Do not consume any caffeinated/decaffeinated beverages or chocolate 12 hours prior to your test. . Do not take any antihistamines 12 hours prior to your test.  On the Day of the Test: . Drink plenty of water. Do not drink any water within one hour of the test. . Do not eat any food 4 hours prior to the  test. . You may take your regular medications prior to the test.  . Take metoprolol (Lopressor) 50 mg two hours prior to test. . FEMALES- please wear underwire-free bra if available      After the Test: . Drink plenty of water. . After receiving IV contrast, you may experience a mild flushed feeling. This is normal. . On occasion, you may experience a mild rash up to 24 hours after the test. This is not dangerous. If this occurs, you can take Benadryl 25 mg and increase your fluid intake. . If you experience trouble breathing, this can be serious. If it is severe call 911 IMMEDIATELY. If it is mild, please call our office   Once we have confirmed authorization from your insurance company, we will call you to set up a date and time for your test. Based on how quickly your insurance processes prior authorizations requests, please allow up to 4 weeks to be contacted for scheduling your Cardiac CT appointment. Be advised that routine Cardiac CT appointments could be scheduled as many as 8 weeks after your provider has ordered it.  For non-scheduling related questions, please contact the cardiac imaging nurse navigator should you have any questions/concerns: Marchia Bond, Cardiac Imaging Nurse Navigator Burley Saver, Interim Cardiac Imaging Nurse [place] and Vascular Services Direct Office Dial: [phone]   For scheduling needs, including cancellations and rescheduling, please call Vivien Rota at [phone], option 3.   Follow-Up: At [facility], you and your health needs are our priority.  As part of our continuing mission to provide you with exceptional heart care, we have created designated Provider Care Teams.  These Care Teams include your primary Cardiologist (physician) and Advanced Practice Providers (APPs -  Physician Assistants and Nurse Practitioners) who all work together to provide you with the care you need, when you need it.  We recommend signing up for the patient  portal called "MyChart".  Sign up information is provided on this After Visit  Summary.  MyChart is used to connect with patients for Virtual Visits (Telemedicine).  Patients are able to view lab/test results, encounter notes, upcoming appointments, etc.  Non-urgent messages can be sent to your provider as well.   To learn more about what you can do with MyChart, go to [email or web address].    Follow up will be determined after the above testing.  Thank you for choosing [facility]!!        Signed, Mark Skains, MD  04/25/2020 4:21 PM    [facility] 

## 2020-04-25 ENCOUNTER — Ambulatory Visit (INDEPENDENT_AMBULATORY_CARE_PROVIDER_SITE_OTHER): Payer: 59 | Admitting: Cardiology

## 2020-04-25 ENCOUNTER — Other Ambulatory Visit: Payer: Self-pay

## 2020-04-25 ENCOUNTER — Encounter: Payer: Self-pay | Admitting: Cardiology

## 2020-04-25 VITALS — BP 140/70 | HR 67 | Ht 65.0 in | Wt 117.0 lb

## 2020-04-25 DIAGNOSIS — I10 Essential (primary) hypertension: Secondary | ICD-10-CM | POA: Diagnosis not present

## 2020-04-25 DIAGNOSIS — R079 Chest pain, unspecified: Secondary | ICD-10-CM

## 2020-04-25 DIAGNOSIS — R072 Precordial pain: Secondary | ICD-10-CM | POA: Diagnosis not present

## 2020-04-25 DIAGNOSIS — Z01812 Encounter for preprocedural laboratory examination: Secondary | ICD-10-CM | POA: Diagnosis not present

## 2020-04-25 MED ORDER — METOPROLOL TARTRATE 50 MG PO TABS: 50.0000 mg | ORAL_TABLET | Freq: Once | ORAL | 0 refills | Status: DC

## 2020-04-25 NOTE — Patient Instructions (Addendum)
Medication Instructions:  The current medical regimen is effective;  continue present plan and medications.  *If you need a refill on your cardiac medications before your next appointment, please call your pharmacy*  Testing/Procedures: Your cardiac CT will be scheduled at one of the below locations:   [facility] [address] [place], [place] [address] [phone]  Please arrive at the [facility] main entrance of [facility] 30 minutes prior to test start time. Proceed to the [facility] Radiology Department (first floor) to check-in and test prep.  Please follow these instructions carefully (unless otherwise directed):  On the Night Before the Test: . Be sure to Drink plenty of water. . Do not consume any caffeinated/decaffeinated beverages or chocolate 12 hours prior to your test. . Do not take any antihistamines 12 hours prior to your test.  On the Day of the Test: . Drink plenty of water. Do not drink any water within one hour of the test. . Do not eat any food 4 hours prior to the test. . You may take your regular medications prior to the test.  . Take metoprolol (Lopressor) 50 mg two hours prior to test. . FEMALES- please wear underwire-free bra if available      After the Test: . Drink plenty of water. . After receiving IV contrast, you may experience a mild flushed feeling. This is normal. . On occasion, you may experience a mild rash up to 24 hours after the test. This is not dangerous. If this occurs, you can take Benadryl 25 mg and increase your fluid intake. . If you experience trouble breathing, this can be serious. If it is severe call 911 IMMEDIATELY. If it is mild, please call our office   Once we have confirmed authorization from your insurance company, we will call you to set up a date and time for your test. Based on how quickly your insurance processes prior authorizations requests, please allow up to 4 weeks to be contacted for  scheduling your Cardiac CT appointment. Be advised that routine Cardiac CT appointments could be scheduled as many as 8 weeks after your provider has ordered it.  For non-scheduling related questions, please contact the cardiac imaging nurse navigator should you have any questions/concerns: Marchia Bond, Cardiac Imaging Nurse Navigator Burley Saver, Interim Cardiac Imaging Nurse [place] and Vascular Services Direct Office Dial: [phone]   For scheduling needs, including cancellations and rescheduling, please call Vivien Rota at [phone], option 3.   Follow-Up: At [facility], you and your health needs are our priority.  As part of our continuing mission to provide you with exceptional heart care, we have created designated Provider Care Teams.  These Care Teams include your primary Cardiologist (physician) and Advanced Practice Providers (APPs -  Physician Assistants and Nurse Practitioners) who all work together to provide you with the care you need, when you need it.  We recommend signing up for the patient portal called "MyChart".  Sign up information is provided on this After Visit Summary.  MyChart is used to connect with patients for Virtual Visits (Telemedicine).  Patients are able to view lab/test results, encounter notes, upcoming appointments, etc.  Non-urgent messages can be sent to your provider as well.   To learn more about what you can do with MyChart, go to [email or web address].    Follow up will be determined after the above testing.  Thank you for choosing [facility]!!

## 2020-04-25 NOTE — Addendum Note (Signed)
Addended by: Shellia Cleverly on: 04/25/2020 05:54 PM   Modules accepted: Orders

## 2020-05-26 ENCOUNTER — Other Ambulatory Visit: Payer: 59 | Admitting: *Deleted

## 2020-05-26 ENCOUNTER — Other Ambulatory Visit: Payer: Self-pay

## 2020-05-26 DIAGNOSIS — R072 Precordial pain: Secondary | ICD-10-CM

## 2020-05-26 DIAGNOSIS — Z01812 Encounter for preprocedural laboratory examination: Secondary | ICD-10-CM

## 2020-05-26 DIAGNOSIS — R079 Chest pain, unspecified: Secondary | ICD-10-CM

## 2020-05-26 LAB — BASIC METABOLIC PANEL
BUN/Creatinine Ratio: 14 (ref 12–28)
BUN: 11 mg/dL (ref 8–27)
CO2: 26 mmol/L (ref 20–29)
Calcium: 8.8 mg/dL (ref 8.7–10.3)
Chloride: 105 mmol/L (ref 96–106)
Creatinine, Ser: 0.79 mg/dL (ref 0.57–1.00)
GFR calc Af Amer: 91 mL/min/{1.73_m2} (ref >59)
GFR calc non Af Amer: 79 mL/min/{1.73_m2} (ref >59)
Glucose: 86 mg/dL (ref 65–99)
Potassium: 3.6 mmol/L (ref 3.5–5.2)
Sodium: 144 mmol/L (ref 134–144)

## 2020-05-30 ENCOUNTER — Telehealth (HOSPITAL_COMMUNITY): Payer: Self-pay | Admitting: *Deleted

## 2020-05-30 NOTE — Telephone Encounter (Signed)

## 2020-05-31 ENCOUNTER — Other Ambulatory Visit: Payer: Self-pay

## 2020-05-31 ENCOUNTER — Ambulatory Visit (HOSPITAL_COMMUNITY)
Admission: RE | Admit: 2020-05-31 | Discharge: 2020-05-31 | Disposition: A | Discharge status: Home / Self Care | Payer: 59 | Source: Ambulatory Visit | Attending: Cardiology | Admitting: Cardiology

## 2020-05-31 DIAGNOSIS — R079 Chest pain, unspecified: Secondary | ICD-10-CM | POA: Diagnosis present

## 2020-05-31 DIAGNOSIS — R072 Precordial pain: Secondary | ICD-10-CM | POA: Diagnosis not present

## 2020-05-31 MED ORDER — NITROGLYCERIN 0.4 MG SL SUBL
0.8000 mg | SUBLINGUAL_TABLET | Freq: Once | SUBLINGUAL | Status: AC
  Administered 2020-05-31: 0.8 mg via SUBLINGUAL

## 2020-05-31 MED ORDER — NITROGLYCERIN 0.4 MG SL SUBL
SUBLINGUAL_TABLET | SUBLINGUAL | Status: AC
  Filled 2020-05-31: qty 2

## 2020-05-31 MED ORDER — IOHEXOL 350 MG/ML SOLN
80.0000 mL | Freq: Once | INTRAVENOUS | Status: AC | PRN
  Administered 2020-05-31: 80 mL via INTRAVENOUS

## 2020-06-06 ENCOUNTER — Encounter (HOSPITAL_COMMUNITY): Payer: Self-pay | Admitting: Emergency Medicine

## 2020-06-06 ENCOUNTER — Other Ambulatory Visit: Payer: Self-pay

## 2020-06-06 DIAGNOSIS — Z87891 Personal history of nicotine dependence: Secondary | ICD-10-CM | POA: Insufficient documentation

## 2020-06-06 DIAGNOSIS — Z85038 Personal history of other malignant neoplasm of large intestine: Secondary | ICD-10-CM | POA: Diagnosis not present

## 2020-06-06 DIAGNOSIS — Z85048 Personal history of other malignant neoplasm of rectum, rectosigmoid junction, and anus: Secondary | ICD-10-CM | POA: Insufficient documentation

## 2020-06-06 DIAGNOSIS — I10 Essential (primary) hypertension: Secondary | ICD-10-CM | POA: Diagnosis not present

## 2020-06-06 DIAGNOSIS — Z79899 Other long term (current) drug therapy: Secondary | ICD-10-CM | POA: Insufficient documentation

## 2020-06-06 DIAGNOSIS — R1013 Epigastric pain: Secondary | ICD-10-CM | POA: Diagnosis not present

## 2020-06-06 LAB — COMPREHENSIVE METABOLIC PANEL
ALT: 11 U/L (ref 0–44)
AST: 16 U/L (ref 15–41)
Albumin: 4.8 g/dL (ref 3.5–5.0)
Alkaline Phosphatase: 69 U/L (ref 38–126)
Anion gap: 14 (ref 5–15)
BUN: 15 mg/dL (ref 8–23)
CO2: 24 mmol/L (ref 22–32)
Calcium: 9.5 mg/dL (ref 8.9–10.3)
Chloride: 103 mmol/L (ref 98–111)
Creatinine, Ser: 0.65 mg/dL (ref 0.44–1.00)
GFR calc Af Amer: >60 mL/min (ref >60)
GFR calc non Af Amer: >60 mL/min (ref >60)
Glucose, Bld: 103 mg/dL — ABNORMAL HIGH (ref 70–99)
Potassium: 3.6 mmol/L (ref 3.5–5.1)
Sodium: 141 mmol/L (ref 135–145)
Total Bilirubin: 1.1 mg/dL (ref 0.3–1.2)
Total Protein: 8.3 g/dL — ABNORMAL HIGH (ref 6.5–8.1)

## 2020-06-06 LAB — CBC
HCT: 38.9 % (ref 36.0–46.0)
Hemoglobin: 12.9 g/dL (ref 12.0–15.0)
MCH: 29.5 pg (ref 26.0–34.0)
MCHC: 33.2 g/dL (ref 30.0–36.0)
MCV: 89 fL (ref 80.0–100.0)
Platelets: 183 10*3/uL (ref 150–400)
RBC: 4.37 MIL/uL (ref 3.87–5.11)
RDW: 14.2 % (ref 11.5–15.5)
WBC: 6.5 10*3/uL (ref 4.0–10.5)
nRBC: 0 % (ref 0.0–0.2)

## 2020-06-06 LAB — URINALYSIS, ROUTINE W REFLEX MICROSCOPIC
Bilirubin Urine: NEGATIVE
Glucose, UA: NEGATIVE mg/dL
Ketones, ur: 80 mg/dL — AB
Nitrite: NEGATIVE
Protein, ur: 30 mg/dL — AB
Specific Gravity, Urine: 1.029 (ref 1.005–1.030)
pH: 5 (ref 5.0–8.0)

## 2020-06-06 LAB — LIPASE, BLOOD: Lipase: 20 U/L (ref 11–51)

## 2020-06-06 MED ORDER — ONDANSETRON 4 MG PO TBDP: 4.0000 mg | ORAL_TABLET | Freq: Once | ORAL | Status: DC | PRN

## 2020-06-06 NOTE — ED Triage Notes (Signed)
Patient is complaining of upper abdominal pain that started yesterday in the am. Patient states that she is also having some nausea.

## 2020-06-07 ENCOUNTER — Telehealth: Payer: Self-pay | Admitting: Cardiology

## 2020-06-07 ENCOUNTER — Emergency Department (HOSPITAL_COMMUNITY)
Admission: EM | Admit: 2020-06-07 | Discharge: 2020-06-07 | Disposition: A | Discharge status: Home / Self Care | Payer: 59 | Attending: Emergency Medicine | Admitting: Emergency Medicine

## 2020-06-07 DIAGNOSIS — I2510 Atherosclerotic heart disease of native coronary artery without angina pectoris: Secondary | ICD-10-CM

## 2020-06-07 DIAGNOSIS — R1013 Epigastric pain: Secondary | ICD-10-CM

## 2020-06-07 DIAGNOSIS — E785 Hyperlipidemia, unspecified: Secondary | ICD-10-CM

## 2020-06-07 MED ORDER — ALUM & MAG HYDROXIDE-SIMETH 200-200-20 MG/5ML PO SUSP
30.0000 mL | Freq: Once | ORAL | Status: AC
  Administered 2020-06-07: 30 mL via ORAL
  Filled 2020-06-07: qty 30

## 2020-06-07 MED ORDER — LIDOCAINE VISCOUS HCL 2 % MT SOLN
15.0000 mL | Freq: Once | OROMUCOSAL | Status: AC
  Administered 2020-06-07: 15 mL via ORAL
  Filled 2020-06-07: qty 15

## 2020-06-07 MED ORDER — ROSUVASTATIN CALCIUM 20 MG PO TABS: 20.0000 mg | ORAL_TABLET | Freq: Every day | ORAL | 1 refills | Status: DC

## 2020-06-07 NOTE — ED Provider Notes (Signed)
[facility] DEPT Provider Note   CSN: [id number] Arrival date & time: 06/06/20  1905     History Chief Complaint  Patient presents with  . Abdominal Pain    Tiffany Walsh is a 64 y.o. female.  HPI   Patient with significant medical history of rectal cancer stage III, hypertension, small bowel obstruction, acid reflux, presents to the emergency department with chief complaint of epigastric abdominal pain that started on Monday. Patient states when she woke up she had a crampy sharp-like pain in her upper abdomen which then became more diffuse. She then developed nausea and vomiting. She states she has had 4 episodes of vomiting since Monday and has been unable to hold any food or water down. Patient denies seeing any blood in her vomit, denies diarrhea, hematochezia, hematuria, dysuria, lower back pain. She states she has history of acid reflux and states this feels similar but she was concerned because it has been lasting for so long. She has been taking her PPIs as prescribed and denies any change in her diet. Patient currently has no abdominal pain, denies nausea, vomiting, states she is feeling much better at this time. Patient denies headache, fever, chills, shortness of breath, chest pain, diarrhea, urinary symptoms, pedal edema.  Past Medical History:  Diagnosis Date  . Abdominal pain, LLQ   . Anemia   . Arthritis of right knee   . Cervical dysplasia   . CIN I (cervical intraepithelial neoplasia I)   . CIN II (cervical intraepithelial neoplasia II)   . Colon cancer ([place])   . Epigastric pain   . GERD (gastroesophageal reflux disease)   . Heme positive stool   . History of rectal cancer   . Hx SBO   . Hypercholesterolemia   . Hypertension   . Malignant neoplasm of rectum, rectosigmoid junction, and anus   . Nausea with vomiting, unspecified   . Peripheral neuropathy due to chemotherapy ([place])   . Rectal cancer ([place])    stage 3  . Vaginal  delivery 6213,0865    Patient Active Problem List   Diagnosis Date Noted  . Hypokalemia   . SBO (small bowel obstruction) ([place]) 05/25/2019  . Essential hypertension 05/25/2019    Past Surgical History:  Procedure Laterality Date  . CERVICAL CONIZATION W/BX N/A 04/04/2015   Procedure: CONIZATION CERVIX WITH BIOPSY;  Surgeon: Thurnell Lose, MD;  Location: [facility];  Service: Gynecology;  Laterality: N/A;  . COLON SURGERY    . MOUTH SURGERY    . widom teeth        OB History   No obstetric history on file.     Family History  Family history unknown: Yes    Social History   Tobacco Use  . Smoking status: Former Smoker    Types: Cigarettes  . Smokeless tobacco: Never Used  Substance Use Topics  . Alcohol use: Yes    Comment: occasional   . Drug use: No    Home Medications Prior to Admission medications   Medication Sig Start Date End Date Taking? Authorizing Provider  acetaminophen (TYLENOL) 325 MG tablet Take 650 mg by mouth every 6 (six) hours as needed for mild pain or headache.   Yes [provider]  esomeprazole (NEXIUM) 40 MG capsule Take 40 mg by mouth daily at 12 noon.   Yes [provider]  famotidine (PEPCID) 20 MG tablet Take 20 mg by mouth 2 (two) times daily. 04/21/20  Yes [provider]  ferrous sulfate 325 (65 FE) MG EC tablet Take 1 tablet (325 mg total) by mouth every Monday, Wednesday, and Friday. Take one tablet twice a day. 05/28/19  Yes Florencia Reasons, MD  metoprolol succinate (TOPROL-XL) 100 MG 24 hr tablet Take 100 mg by mouth daily. Take with or immediately following a meal.   Yes [provider]  verapamil (CALAN-SR) 240 MG CR tablet Take 240 mg by mouth daily.    Yes [provider]  rosuvastatin (CRESTOR) 20 MG tablet Take 1 tablet (20 mg total) by mouth daily. Patient not taking: Reported on 06/07/2020 06/07/20   Jerline Pain, MD    Allergies    Patient has no known allergies.  Review of Systems     Review of Systems  Constitutional: Negative for chills and fever.  HENT: Negative for congestion.   Respiratory: Negative for shortness of breath.   Cardiovascular: Negative for chest pain.  Gastrointestinal: Negative for abdominal pain, diarrhea, nausea and vomiting.  Genitourinary: Negative for dysuria, enuresis and flank pain.  Musculoskeletal: Negative for back pain and myalgias.  Skin: Negative for rash.  Neurological: Negative for dizziness and headaches.  Hematological: Does not bruise/bleed easily.    Physical Exam Updated Vital Signs BP 132/85 (BP Location: Left Arm)   Pulse 61   Temp 97.9 F (36.6 C) (Oral)   Resp 18   Ht 5\' 5"  (1.651 m)   Wt 53.5 kg   SpO2 99%   BMI 19.64 kg/m   Physical Exam Vitals and nursing note reviewed.  Constitutional:      General: She is not in acute distress.    Appearance: She is not ill-appearing.  HENT:     Head: Normocephalic and atraumatic.     Nose: No congestion.     Mouth/Throat:     Mouth: Mucous membranes are moist.     Pharynx: Oropharynx is clear.  Eyes:     General: No scleral icterus. Cardiovascular:     Rate and Rhythm: Normal rate and regular rhythm.     Pulses: Normal pulses.     Heart sounds: No murmur heard.  No friction rub. No gallop.   Pulmonary:     Effort: No respiratory distress.     Breath sounds: No wheezing, rhonchi or rales.  Abdominal:     General: There is no distension.     Palpations: Abdomen is soft.     Tenderness: There is no abdominal tenderness. There is no right CVA tenderness, left CVA tenderness or guarding.     Comments: Patient abdomen was visualized, small surgical scars noted, nondistended, normoactive bowel sounds, dull to percussion, nontender to palpation, no signs of acute abdomen noted.  Musculoskeletal:        General: No swelling.     Right lower leg: No edema.     Left lower leg: No edema.  Skin:    General: Skin is warm and dry.     Capillary Refill: Capillary refill  takes less than 2 seconds.     Findings: No rash.  Neurological:     General: No focal deficit present.     Mental Status: She is alert and oriented to person, place, and time.  Psychiatric:        Mood and Affect: Mood normal.     ED Results / Procedures / Treatments   Labs (all labs ordered are listed, but only abnormal results are displayed) Labs Reviewed  COMPREHENSIVE METABOLIC PANEL - Abnormal; Notable for the following  components:      Result Value   Glucose, Bld 103 (*)    Total Protein 8.3 (*)    All other components within normal limits  URINALYSIS, ROUTINE W REFLEX MICROSCOPIC - Abnormal; Notable for the following components:   APPearance HAZY (*)    Hgb urine dipstick MODERATE (*)    Ketones, ur 80 (*)    Protein, ur 30 (*)    Leukocytes,Ua SMALL (*)    Bacteria, UA MANY (*)    All other components within normal limits  LIPASE, BLOOD  CBC    EKG None  Radiology No results found.  Procedures Procedures (including critical care time)  Medications Ordered in ED Medications  ondansetron (ZOFRAN-ODT) disintegrating tablet 4 mg (has no administration in time range)  alum & mag hydroxide-simeth (MAALOX/MYLANTA) 200-200-20 MG/5ML suspension 30 mL (30 mLs Oral Given 06/07/20 1248)    And  lidocaine (XYLOCAINE) 2 % viscous mouth solution 15 mL (15 mLs Oral Given 06/07/20 1248)    ED Course  I have reviewed the triage vital signs and the nursing notes.  Pertinent labs & imaging results that were available during my care of the patient were reviewed by me and considered in my medical decision making (see chart for details).    MDM Rules/Calculators/A&P                          I have personally reviewed all imaging, labs and have interpreted them.  Patient presents with epigastric pain and nausea and vomiting x3 days. Patient was alert and oriented, did not appear in acute distress, vital signs reassuring. On exam patient had no complaints this time saying her  abdominal pain and nausea has gone away, lung sounds are clear bilaterally, abdomen was soft nontender to palpation, no pedal edema noted. Will order lab work and p.o. challenge the patient for further evaluation.  CMP does not show electrolyte abnormalities, no signs of metabolic acidosis, no elevated liver enzymes, no signs of AKI. Lipase 20, UA shows small leukocytes many bacteria, CBC does not show leukocytosis or anemia.   I have low suspicion for intra-abdominal abnormality requiring surgical intervention as patient denies abdominal pain, nausea, vomiting, no acute abdomen noted on exam, she is tolerating p.o. without any difficulty. Low suspicion for systemic infection as patient was nontoxic-appearing, vital signs reassuring, no obvious source of infection on exam, no leukocytosis seen on CBC. Low suspicion for pyelonephritis or UTI as patient denies urinary symptoms, no urinary urgency frequency or dysuria. UA is positive for leukocytes but since she is not symptomatic and there is no CVA tenderness will defer treatment.  Low suspicion for pancreatitis as there is no epigastric pain, no history of pancreatitis, has little risk factors, lipase 20.  I suspect patient suffering from acid reflux and will recommend continue with her home medications of PPIs and she starting a bland affect couple days.  She should follow-up with her oncologist for further recommendation evaluation.  Due to well-appearing patient, benign physical exam and no complaints at this time further lab work and imaging were not warranted.  Patient appears resting calmly, vital signs reassuring, no indication for hospital admission.  Patient is given at home schedule strict return precautions.  Patient verbalized that she understood and agreed to plan. Final Clinical Impression(s) / ED Diagnoses Final diagnoses:  Epigastric pain    Rx / DC Orders ED Discharge Orders    None  Marcello Fennel, PA-C 06/07/20  1402    Daleen Bo, MD 06/07/20 [phone]

## 2020-06-07 NOTE — Discharge Instructions (Addendum)
You have been seen here for stomach pain.  Lab work and exam look reassuring.  I recommend continuing with your acid pills as prescribed by your doctor.  I want you to eat a bland diet for the next few days and slowly go back to your normal diet.  Please stay away from spicy, acidic, spicy foods, caffeinated drinks and carbonated drinks as this can increase your acid reflux.  I want you to follow-up with your oncologist for further recommendations if symptoms persist or worsen.  I want to come back to emergency department if you develop severe abdominal pain, uncontrolled nausea, vomiting, inability to have a bowel movement or pass gas, chest pain, shortness of breath, fever chills as these symptoms require further evaluation management.

## 2020-06-07 NOTE — ED Notes (Signed)
Pt given soda

## 2020-06-07 NOTE — Telephone Encounter (Signed)
Spoke with the pt and informed her of Coronary CT results and recommendations, per Dr. Marlou Porch.  Informed the pt that per Dr. Marlou Porch, he would like for her to start Crestor 20 mg po daily, and come in for labs in 3 months, to recheck lipids and ALT.   Confirmed the pharmacy of choice with the pt.  Scheduled the pt to come in for labs on 12/15.  She is aware to come fasting to this lab appt.  Pt verbalized understanding and agrees with this plan.  Pt was more than gracious for all the assistance provided.

## 2020-06-07 NOTE — Telephone Encounter (Signed)
Jerline Pain, MD  06/01/2020 7:01 PM EDT     Calcified plaque noted in proximal LAD Non flow limiting  Given these findings, let's start Crestor 20mg  PO QD. This will help reduce MI risk.  Repeat lipid panel and ALT in 3 months.  Candee Furbish, MD

## 2020-06-07 NOTE — Telephone Encounter (Signed)
Patient called back returning a call to discuss CT results from 05/31/20

## 2020-09-06 ENCOUNTER — Other Ambulatory Visit: Payer: 59 | Admitting: *Deleted

## 2020-09-06 ENCOUNTER — Other Ambulatory Visit: Payer: Self-pay

## 2020-09-06 DIAGNOSIS — I2510 Atherosclerotic heart disease of native coronary artery without angina pectoris: Secondary | ICD-10-CM

## 2020-09-06 DIAGNOSIS — E785 Hyperlipidemia, unspecified: Secondary | ICD-10-CM

## 2020-09-06 LAB — LIPID PANEL
Chol/HDL Ratio: 1.6 ratio (ref 0.0–4.4)
Cholesterol, Total: 163 mg/dL (ref 100–199)
HDL: 102 mg/dL (ref >39)
LDL Chol Calc (NIH): 46 mg/dL (ref 0–99)
Triglycerides: 85 mg/dL (ref 0–149)
VLDL Cholesterol Cal: 15 mg/dL (ref 5–40)

## 2020-09-06 LAB — ALT: ALT: 19 IU/L (ref 0–32)

## 2020-09-08 ENCOUNTER — Telehealth: Payer: Self-pay | Admitting: Cardiology

## 2020-09-08 NOTE — Telephone Encounter (Signed)
Results of Lipid/ALT reviewed with pt.  She will continue current therapy and f/u in 04/2021 with Dr Marlou Porch.

## 2020-09-08 NOTE — Telephone Encounter (Signed)
° ° ° °  pt is returning call to get lab result

## 2020-10-24 DIAGNOSIS — D649 Anemia, unspecified: Secondary | ICD-10-CM | POA: Diagnosis not present

## 2020-10-24 DIAGNOSIS — K219 Gastro-esophageal reflux disease without esophagitis: Secondary | ICD-10-CM | POA: Diagnosis not present

## 2020-10-24 DIAGNOSIS — M1711 Unilateral primary osteoarthritis, right knee: Secondary | ICD-10-CM | POA: Diagnosis not present

## 2020-10-24 DIAGNOSIS — E7800 Pure hypercholesterolemia, unspecified: Secondary | ICD-10-CM | POA: Diagnosis not present

## 2020-10-24 DIAGNOSIS — I10 Essential (primary) hypertension: Secondary | ICD-10-CM | POA: Diagnosis not present

## 2020-10-24 DIAGNOSIS — G620 Drug-induced polyneuropathy: Secondary | ICD-10-CM | POA: Diagnosis not present

## 2020-11-26 ENCOUNTER — Other Ambulatory Visit: Payer: Self-pay | Admitting: Cardiology

## 2020-11-26 DIAGNOSIS — I2510 Atherosclerotic heart disease of native coronary artery without angina pectoris: Secondary | ICD-10-CM

## 2020-11-26 DIAGNOSIS — E785 Hyperlipidemia, unspecified: Secondary | ICD-10-CM

## 2020-12-13 DIAGNOSIS — L819 Disorder of pigmentation, unspecified: Secondary | ICD-10-CM | POA: Diagnosis not present

## 2020-12-13 DIAGNOSIS — D229 Melanocytic nevi, unspecified: Secondary | ICD-10-CM | POA: Diagnosis not present

## 2021-02-23 ENCOUNTER — Other Ambulatory Visit: Payer: Self-pay | Admitting: Cardiology

## 2021-02-23 DIAGNOSIS — I2510 Atherosclerotic heart disease of native coronary artery without angina pectoris: Secondary | ICD-10-CM

## 2021-02-23 DIAGNOSIS — E785 Hyperlipidemia, unspecified: Secondary | ICD-10-CM

## 2021-03-28 ENCOUNTER — Other Ambulatory Visit: Payer: Self-pay

## 2021-03-28 DIAGNOSIS — I2510 Atherosclerotic heart disease of native coronary artery without angina pectoris: Secondary | ICD-10-CM

## 2021-03-28 DIAGNOSIS — E785 Hyperlipidemia, unspecified: Secondary | ICD-10-CM

## 2021-03-28 MED ORDER — ROSUVASTATIN CALCIUM 20 MG PO TABS: 20.0000 mg | ORAL_TABLET | Freq: Every day | ORAL | 1 refills | Status: DC

## 2021-03-28 NOTE — Telephone Encounter (Signed)
Pt's medication was sent to pt's pharmacy as requested. Confirmation received.  °

## 2021-04-30 ENCOUNTER — Other Ambulatory Visit: Payer: Self-pay

## 2021-04-30 ENCOUNTER — Ambulatory Visit (INDEPENDENT_AMBULATORY_CARE_PROVIDER_SITE_OTHER): Payer: Medicare Other | Admitting: Cardiology

## 2021-04-30 ENCOUNTER — Encounter: Payer: Self-pay | Admitting: Cardiology

## 2021-04-30 VITALS — BP 136/78 | HR 57 | Ht 65.0 in | Wt 122.0 lb

## 2021-04-30 DIAGNOSIS — I10 Essential (primary) hypertension: Secondary | ICD-10-CM

## 2021-04-30 DIAGNOSIS — E785 Hyperlipidemia, unspecified: Secondary | ICD-10-CM

## 2021-04-30 DIAGNOSIS — I2510 Atherosclerotic heart disease of native coronary artery without angina pectoris: Secondary | ICD-10-CM | POA: Diagnosis not present

## 2021-04-30 MED ORDER — ASPIRIN EC 81 MG PO TBEC: 81.0000 mg | DELAYED_RELEASE_TABLET | ORAL | 3 refills | Status: AC

## 2021-04-30 NOTE — Patient Instructions (Signed)
Medication Instructions:  Please start Aspirin 81 mg one tablet every other day. Continue all other medications as listed.  *If you need a refill on your cardiac medications before your next appointment, please call your pharmacy*  Follow-Up: At [facility], you and your health needs are our priority.  As part of our continuing mission to provide you with exceptional heart care, we have created designated Provider Care Teams.  These Care Teams include your primary Cardiologist (physician) and Advanced Practice Providers (APPs -  Physician Assistants and Nurse Practitioners) who all work together to provide you with the care you need, when you need it.  We recommend signing up for the patient portal called "MyChart".  Sign up information is provided on this After Visit Summary.  MyChart is used to connect with patients for Virtual Visits (Telemedicine).  Patients are able to view lab/test results, encounter notes, upcoming appointments, etc.  Non-urgent messages can be sent to your provider as well.   To learn more about what you can do with MyChart, go to [email or web address].    Your next appointment:   1 year(s)  The format for your next appointment:   In Person  Provider:   Candee Furbish, MD   Thank you for choosing [facility]!!

## 2021-04-30 NOTE — Progress Notes (Signed)
Cardiology Office Note:    Date:  04/30/2021   ID:  Tiffany Walsh, DOB 1956-07-31, MRN [id number]  PCP:  Aurea Graff.Marlou Sa, MD  [facility] Cardiologist:  None  [facility] Electrophysiologist:  None   Referring MD: Alroy Dust, L.Marlou Sa, MD    History of Present Illness:    Tiffany Walsh is a 65 y.o. female here for for follow up CAD mild non obstructive on CT coronary.   [name] husband  Chest discomfort was prior noted with intermediate pain. Doing much better now.   HDL 78 LDL 109 previously. Now LDL 46 on Crestor.   Dull pain, comes ang go. Lasts a minutes. Can be at rest. On treadmill ok. Lifts mother wheelchair.   Quit tob 20 years.   Past Medical History:  Diagnosis Date   Abdominal pain, LLQ    Anemia    Arthritis of right knee    Cervical dysplasia    CIN I (cervical intraepithelial neoplasia I)    CIN II (cervical intraepithelial neoplasia II)    Colon cancer ([facility])    Epigastric pain    GERD (gastroesophageal reflux disease)    Heme positive stool    History of rectal cancer    Hx SBO    Hypercholesterolemia    Hypertension    Malignant neoplasm of rectum, rectosigmoid junction, and anus    Nausea with vomiting, unspecified    Peripheral neuropathy due to chemotherapy [facility])    Rectal cancer ([place])    stage 3   Vaginal delivery [id number]    Past Surgical History:  Procedure Laterality Date   CERVICAL CONIZATION W/BX N/A 04/04/2015   Procedure: CONIZATION CERVIX WITH BIOPSY;  Surgeon: Thurnell Lose, MD;  Location: [facility];  Service: Gynecology;  Laterality: N/A;   COLON SURGERY     MOUTH SURGERY     widom teeth       Current Medications: Current Meds  Medication Sig   acetaminophen (TYLENOL) 325 MG tablet Take 650 mg by mouth every 6 (six) hours as needed for mild pain or headache.   aspirin EC 81 MG tablet Take 1 tablet (81 mg total) by mouth every other day. Swallow whole.   esomeprazole (NEXIUM) 40 MG capsule Take 40 mg by mouth daily at 12  noon.   famotidine (PEPCID) 20 MG tablet Take 20 mg by mouth 2 (two) times daily.   ferrous sulfate 325 (65 FE) MG EC tablet Take 1 tablet (325 mg total) by mouth every Monday, Wednesday, and Friday. Take one tablet twice a day.   metoprolol succinate (TOPROL-XL) 100 MG 24 hr tablet Take 100 mg by mouth daily. Take with or immediately following a meal.   rosuvastatin (CRESTOR) 20 MG tablet Take 1 tablet (20 mg total) by mouth daily. Please keep upcoming appt in August 2022 with Dr. Marlou Porch before anymore refills. Thank you   verapamil (CALAN-SR) 240 MG CR tablet Take 240 mg by mouth daily.      Allergies:   Patient has no known allergies.   Social History   Socioeconomic History   Marital status: Married    Spouse name: Not on file   Number of children: Not on file   Years of education: Not on file   Highest education level: Not on file  Occupational History   Not on file  Tobacco Use   Smoking status: Former    Types: Cigarettes   Smokeless tobacco: Never  Substance and Sexual Activity   Alcohol use: Yes  Comment: occasional    Drug use: No   Sexual activity: Not on file  Other Topics Concern   Not on file  Social History Narrative   Not on file   Social Determinants of Health   Financial Resource Strain: Not on file  Food Insecurity: Not on file  Transportation Needs: Not on file  Physical Activity: Not on file  Stress: Not on file  Social Connections: Not on file     Family History: The patient's Family history is unknown by patient.  Mother did not have heart disease.  ROS:   Please see the history of present illness.    Denies any fevers chills nausea vomiting syncope bleeding.  Occasional heartburn noted.  All other systems reviewed and are negative.  EKGs/Labs/Other Studies Reviewed:    The following studies were reviewed today: EKG reviewed, outside office notes reviewed, outside labs reviewed  Cardiac CT 05/31/20:  IMPRESSION: 1. Coronary calcium  score of 114. This was 30 percentile for age and sex matched control. (Calcified plaque in proximal LAD)   2. Normal coronary origin with right dominance.   3. There is a focal dense calcified proximal LAD plaque, 0-24% stenosis, non flow limiting.   4. Very small area of aortic root calcification/ atherosclerosis.   5. CAD-RADS 1. Minimal non-obstructive CAD (0-24%). Consider non-atherosclerotic causes of chest pain. Consider preventive therapy and risk factor modification  EKG: Today SB 57 no changes.   Recent Labs: 06/06/2020: BUN 15; Creatinine, Ser 0.65; Hemoglobin 12.9; Platelets 183; Potassium 3.6; Sodium 141 09/06/2020: ALT 19  Recent Lipid Panel    Component Value Date/Time   CHOL 163 09/06/2020 1109   TRIG 85 09/06/2020 1109   HDL 102 09/06/2020 1109   CHOLHDL 1.6 09/06/2020 1109   LDLCALC 46 09/06/2020 1109    Physical Exam:    VS:  BP 136/78   Pulse (!) 57   Ht '5\' 5"'$  (1.651 m)   Wt 122 lb (55.3 kg)   BMI 20.30 kg/m     Wt Readings from Last 3 Encounters:  04/30/21 122 lb (55.3 kg)  06/06/20 118 lb (53.5 kg)  04/25/20 117 lb (53.1 kg)    GEN: Thin, well developed, in no acute distress HEENT: normal Neck: no JVD, carotid bruits, or masses Cardiac: RRR; no murmurs, rubs, or gallops,no edema  Respiratory:  clear to auscultation bilaterally, normal work of breathing GI: soft, nontender, nondistended, + BS MS: no deformity or atrophy Skin: warm and dry, no rash Neuro:  Alert and Oriented x 3, Strength and sensation are intact Psych: euthymic mood, full affect   ASSESSMENT:    1. Coronary artery calcification seen on CT scan   2. Hyperlipidemia, unspecified hyperlipidemia type   3. Essential hypertension     PLAN:    In order of problems listed above:  Non obstructive mild CAD from CT of heart with prior chest discomfort  -Former smoker, quit 20 years ago.  No diabetes, no early family history of coronary artery disease. Coronary CT scan  reviewed-- recommend 81 mg of aspirin, statin etc.QOD asa seems reasonable. Watch for any signs of bleeding.  --Crestor '20mg'$  PO QD - LDL 46. No myalgias. Doing well.  Essential hypertension -Has been on both metoprolol 100 ER and verapamil 240 ER.  Reasonably well controlled.  Could consider angiotensin receptor blocker in the future.  Creat 0.65, LDL now 46 on Crestor, ALT 19, PLT 183  Medication Adjustments/Labs and Tests Ordered: Current medicines are reviewed at length  with the patient today.  Concerns regarding medicines are outlined above.  Orders Placed This Encounter  Procedures   EKG 12-Lead    Meds ordered this encounter  Medications   aspirin EC 81 MG tablet    Sig: Take 1 tablet (81 mg total) by mouth every other day. Swallow whole.    Dispense:  90 tablet    Refill:  3     Patient Instructions  Medication Instructions:  Please start Aspirin 81 mg one tablet every other day. Continue all other medications as listed.  *If you need a refill on your cardiac medications before your next appointment, please call your pharmacy*  Follow-Up: At [facility], you and your health needs are our priority.  As part of our continuing mission to provide you with exceptional heart care, we have created designated Provider Care Teams.  These Care Teams include your primary Cardiologist (physician) and Advanced Practice Providers (APPs -  Physician Assistants and Nurse Practitioners) who all work together to provide you with the care you need, when you need it.  We recommend signing up for the patient portal called "MyChart".  Sign up information is provided on this After Visit Summary.  MyChart is used to connect with patients for Virtual Visits (Telemedicine).  Patients are able to view lab/test results, encounter notes, upcoming appointments, etc.  Non-urgent messages can be sent to your provider as well.   To learn more about what you can do with MyChart, go to  [email or web address].    Your next appointment:   1 year(s)  The format for your next appointment:   In Person  Provider:   Candee Furbish, MD   Thank you for choosing [facility]!!     Signed, Candee Furbish, MD  04/30/2021 10:25 AM    [place]

## 2021-05-01 DIAGNOSIS — Z23 Encounter for immunization: Secondary | ICD-10-CM | POA: Diagnosis not present

## 2021-05-01 DIAGNOSIS — I10 Essential (primary) hypertension: Secondary | ICD-10-CM | POA: Diagnosis not present

## 2021-05-01 DIAGNOSIS — E7800 Pure hypercholesterolemia, unspecified: Secondary | ICD-10-CM | POA: Diagnosis not present

## 2021-05-01 DIAGNOSIS — R109 Unspecified abdominal pain: Secondary | ICD-10-CM | POA: Diagnosis not present

## 2021-05-01 DIAGNOSIS — K219 Gastro-esophageal reflux disease without esophagitis: Secondary | ICD-10-CM | POA: Diagnosis not present

## 2021-05-01 DIAGNOSIS — G620 Drug-induced polyneuropathy: Secondary | ICD-10-CM | POA: Diagnosis not present

## 2021-05-01 DIAGNOSIS — D649 Anemia, unspecified: Secondary | ICD-10-CM | POA: Diagnosis not present

## 2021-05-23 DIAGNOSIS — Z1231 Encounter for screening mammogram for malignant neoplasm of breast: Secondary | ICD-10-CM | POA: Diagnosis not present

## 2021-05-30 DIAGNOSIS — D649 Anemia, unspecified: Secondary | ICD-10-CM | POA: Diagnosis not present

## 2021-05-30 DIAGNOSIS — Z79899 Other long term (current) drug therapy: Secondary | ICD-10-CM | POA: Diagnosis not present

## 2021-05-30 DIAGNOSIS — Z85048 Personal history of other malignant neoplasm of rectum, rectosigmoid junction, and anus: Secondary | ICD-10-CM | POA: Diagnosis not present

## 2021-05-30 DIAGNOSIS — K219 Gastro-esophageal reflux disease without esophagitis: Secondary | ICD-10-CM | POA: Diagnosis not present

## 2021-05-31 DIAGNOSIS — R922 Inconclusive mammogram: Secondary | ICD-10-CM | POA: Diagnosis not present

## 2021-05-31 DIAGNOSIS — R928 Other abnormal and inconclusive findings on diagnostic imaging of breast: Secondary | ICD-10-CM | POA: Diagnosis not present

## 2021-06-01 ENCOUNTER — Other Ambulatory Visit: Payer: Self-pay | Admitting: Cardiology

## 2021-06-01 DIAGNOSIS — I2510 Atherosclerotic heart disease of native coronary artery without angina pectoris: Secondary | ICD-10-CM

## 2021-06-01 DIAGNOSIS — E785 Hyperlipidemia, unspecified: Secondary | ICD-10-CM

## 2021-06-11 ENCOUNTER — Other Ambulatory Visit: Payer: Self-pay | Admitting: Radiology

## 2021-06-11 DIAGNOSIS — Z170 Estrogen receptor positive status [ER+]: Secondary | ICD-10-CM | POA: Diagnosis not present

## 2021-06-11 DIAGNOSIS — C50411 Malignant neoplasm of upper-outer quadrant of right female breast: Secondary | ICD-10-CM | POA: Diagnosis not present

## 2021-06-13 ENCOUNTER — Telehealth: Payer: Self-pay | Admitting: Oncology

## 2021-06-13 ENCOUNTER — Encounter: Payer: Self-pay | Admitting: *Deleted

## 2021-06-13 NOTE — Telephone Encounter (Signed)
Spoke to patient to confirm morning clinic appointment for 9/28, [facility] will send paperwork

## 2021-06-14 ENCOUNTER — Encounter: Payer: Self-pay | Admitting: Family Medicine

## 2021-06-15 ENCOUNTER — Other Ambulatory Visit: Payer: Self-pay | Admitting: *Deleted

## 2021-06-15 DIAGNOSIS — C50411 Malignant neoplasm of upper-outer quadrant of right female breast: Secondary | ICD-10-CM

## 2021-06-15 DIAGNOSIS — Z170 Estrogen receptor positive status [ER+]: Secondary | ICD-10-CM | POA: Insufficient documentation

## 2021-06-19 NOTE — Progress Notes (Signed)
Radiation Oncology         [phone] ________________________________  Initial Outpatient Consultation  Name: Tiffany Walsh MRN: [id number]  Date: 06/20/2021  DOB: 08/07/1956  CC:Mitchell, L.Dean, MD  Blackman, Douglas, MD   REFERRING PHYSICIAN: Blackman, Douglas, MD  DIAGNOSIS:    ICD-10-CM   1. Malignant neoplasm of upper-outer quadrant of right breast in female, estrogen receptor positive (HCC)  C50.411    Z17.0       Cancer Staging Malignant neoplasm of upper-outer quadrant of right breast in female, estrogen receptor positive (HCC) Staging form: Breast, AJCC 8th Edition - Clinical stage from 06/20/2021: Stage IA (cT1b, cN0, cM0, G1, ER+, PR+, HER2-) - Unsigned Stage prefix: Initial diagnosis Histologic grading system: 3 grade system Laterality: Right Staged by: Pathologist and managing physician Stage used in treatment planning: Yes National guidelines used in treatment planning: Yes Type of national guideline used in treatment planning: NCCN   CHIEF COMPLAINT: Here to discuss management of right breast cancer  HISTORY OF PRESENT ILLNESS::Tiffany Walsh is a 65 y.o. female who presented with right breast abnormality on the following imaging: bilateral screening mammogram at [facility] on the date of 05/23/21.  No symptoms, if any, were reported at that time. Bilateral diagnostic right breast mammogram revealed a 1.2 cm mass in the right breast and right breast ultrasound on 05/31/21 further revealed a 1.0 cm mass in the right breast upper-outer quadrant.   Right axilla was negative on ultrasound  Right breast biopsy at the 10 o'clock position, 6cmfn on date of 06/11/21 showed low grade invasive mammary carcinoma measuring 3.5 mm in the maximum extent, with focal mammary carcinoma in-situ.  ER status: 95%, positive; PR status 95%, positive, both with strong staining intensity, Her2 status negative; Ki67 10%..   Past history of cancer as follows: diagnosed with a T3 N1,  moderately differentiated adenocarcinoma of the upper rectum diagnosed in March 2009.     Prior Therapy: Status post laparoscopic low anterior resection.  Tumor was T3 N1 with 3 out of 19 lymph nodes involved.  Operation was done in March 2009.  The patient received radiation therapy concomitantly with Xeloda.  Therapy concluded on February 22, 2008.  She is status post ostomy takedown in August 2009.  She received 6 months of therapy adjuvantly, receiving oxaliplatin and Xeloda therapy, concluded in December 2009.  She is here with her significant other.  She works as a cashier.  PREVIOUS RADIATION THERAPY: Yes as above  PAST MEDICAL HISTORY:  has a past medical history of Abdominal pain, LLQ, Anemia, Arthritis of right knee, Cervical dysplasia, CIN I (cervical intraepithelial neoplasia I), CIN II (cervical intraepithelial neoplasia II), Colon cancer (HCC), Epigastric pain, GERD (gastroesophageal reflux disease), Heme positive stool, History of rectal cancer, SBO, Hypercholesterolemia, Hypertension, Malignant neoplasm of rectum, rectosigmoid junction, and anus, Nausea with vomiting, unspecified, Peripheral neuropathy due to chemotherapy (HCC), Rectal cancer (HCC), and Vaginal delivery (1973,1975).    PAST SURGICAL HISTORY: Past Surgical History:  Procedure Laterality Date   CERVICAL CONIZATION W/BX N/A 04/04/2015   Procedure: CONIZATION CERVIX WITH BIOPSY;  Surgeon: Evelyn Varnado, MD;  Location: [facility];  Service: Gynecology;  Laterality: N/A;   COLON SURGERY     MOUTH SURGERY     widom teeth       FAMILY HISTORY: family history includes Breast cancer in her maternal aunt; Cancer in her maternal aunt and maternal grandmother; Colon cancer in her maternal uncle; Lung cancer in her maternal uncle.  SOCIAL HISTORY:    reports that she has quit smoking. Her smoking use included cigarettes. She has never used smokeless tobacco. She reports current alcohol use. She reports that she does not use  drugs.  ALLERGIES: Patient has no known allergies.  MEDICATIONS:  Current Outpatient Medications  Medication Sig Dispense Refill   acetaminophen (TYLENOL) 325 MG tablet Take 650 mg by mouth every 6 (six) hours as needed for mild pain or headache.     aspirin EC 81 MG tablet Take 1 tablet (81 mg total) by mouth every other day. Swallow whole. 90 tablet 3   esomeprazole (NEXIUM) 40 MG capsule Take 40 mg by mouth daily at 12 noon.     famotidine (PEPCID) 20 MG tablet Take 20 mg by mouth 2 (two) times daily.     ferrous sulfate 325 (65 FE) MG EC tablet Take 1 tablet (325 mg total) by mouth every Monday, Wednesday, and Friday. Take one tablet twice a day. 30 tablet 0   metoprolol succinate (TOPROL-XL) 100 MG 24 hr tablet Take 100 mg by mouth daily. Take with or immediately following a meal.     rosuvastatin (CRESTOR) 20 MG tablet TAKE 1 TABLET BY MOUTH ONCE DAILY . APPOINTMENT REQUIRED FOR FUTURE REFILLS 90 tablet 3   verapamil (CALAN-SR) 240 MG CR tablet Take 240 mg by mouth daily.      No current facility-administered medications for this encounter.    REVIEW OF SYSTEMS: As above in HPI.   PHYSICAL EXAM:  vitals were not taken for this visit.   General: Alert and oriented, in no acute distress Heart: Regular in rate and rhythm with no murmurs, rubs, or gallops. Chest: Clear to auscultation bilaterally, with no rhonchi, wheezes, or rales. Extremities: No cyanosis or edema. Skin: No concerning lesions. Musculoskeletal: symmetric strength and muscle tone throughout. Neurologic:  No obvious focalities. Speech is fluent. Coordination is intact. Psychiatric: Judgment and insight are intact. Affect is appropriate. Breasts: No palpable masses appreciated in the breasts or axillae bilaterally.    ECOG = 0  0 - Asymptomatic (Fully active, able to carry on all predisease activities without restriction)  1 - Symptomatic but completely ambulatory (Restricted in physically strenuous activity but  ambulatory and able to carry out work of a light or sedentary nature. For example, light housework, office work)  2 - Symptomatic, <50% in bed during the day (Ambulatory and capable of all self care but unable to carry out any work activities. Up and about more than 50% of waking hours)  3 - Symptomatic, >50% in bed, but not bedbound (Capable of only limited self-care, confined to bed or chair 50% or more of waking hours)  4 - Bedbound (Completely disabled. Cannot carry on any self-care. Totally confined to bed or chair)  5 - Death   Eustace Pen MM, Creech RH, Tormey DC, et al. [phone]). "Toxicity and response criteria of the [facility]". [place] Oncol. 5 (6): 649-55   LABORATORY DATA:  Lab Results  Component Value Date   WBC 4.0 06/20/2021   HGB 10.9 (L) 06/20/2021   HCT 33.5 (L) 06/20/2021   MCV 88.6 06/20/2021   PLT 149 (L) 06/20/2021   CMP     Component Value Date/Time   NA 144 06/20/2021 0831   NA 144 05/26/2020 0939   NA 144 05/09/2015 0923   K 4.2 06/20/2021 0831   K 3.9 05/09/2015 0923   CL 109 06/20/2021 0831   CL 101 10/27/2012 0913   CO2 25 06/20/2021  0831   CO2 26 05/09/2015 0923   GLUCOSE 94 06/20/2021 0831   GLUCOSE 92 05/09/2015 0923   GLUCOSE 83 10/27/2012 0913   BUN 13 06/20/2021 0831   BUN 11 05/26/2020 0939   BUN 12.6 05/09/2015 0923   CREATININE 0.80 06/20/2021 0831   CREATININE 0.8 05/09/2015 0923   CALCIUM 9.1 06/20/2021 0831   CALCIUM 8.5 05/09/2015 0923   PROT 7.2 06/20/2021 0831   PROT 6.9 05/09/2015 0923   ALBUMIN 4.0 06/20/2021 0831   ALBUMIN 3.7 05/09/2015 0923   AST 19 06/20/2021 0831   AST 13 05/09/2015 0923   ALT 15 06/20/2021 0831   ALT 10 05/09/2015 0923   ALKPHOS 96 06/20/2021 0831   ALKPHOS 76 05/09/2015 0923   BILITOT 0.4 06/20/2021 0831   BILITOT 0.27 05/09/2015 0923   GFRNONAA >60 06/20/2021 0831   GFRAA >60 06/06/2020 2141         RADIOGRAPHY: As above   IMPRESSION/PLAN: Right breast cancer,  stage I, ER positive  She has been discussed at our multidisciplinary tumor board.  The consensus is that she would be a good candidate for breast conservation. I talked to her about the option of a mastectomy and informed her that her expected overall survival would be equivalent between mastectomy and breast conservation, based upon randomized controlled data. She is enthusiastic about breast conservation.  It was a pleasure meeting the patient today. We discussed the risks, benefits, and side effects of radiotherapy. I recommend radiotherapy to the right breast to reduce her risk of locoregional recurrence by 2/3.  We discussed that radiation would take approximately 3-4 weeks to complete and that I would give the patient a few weeks to heal following surgery before starting treatment planning.  If chemotherapy were to be given, this would precede radiotherapy. We spoke about acute effects including skin irritation and fatigue as well as much less common late effects including internal organ injury or irritation. We spoke about the latest technology that is used to minimize the risk of late effects for patients undergoing radiotherapy to the breast or chest wall. No guarantees of treatment were given. The patient is enthusiastic about proceeding with treatment. I look forward to participating in the patient's care.  I will await her referral back to me for postoperative follow-up and eventual CT simulation/treatment planning.  She understands that Oncotype testing will be performed to determine her chemotherapy disposition.  She understands as well that a referral will be made to genetic counseling.  On date of service, in total, I spent 45 minutes on this encounter. Patient was seen in person.   __________________________________________   Eppie Gibson, MD  This document serves as a record of services personally performed by Eppie Gibson, MD. It was created on her behalf by Roney Mans, a  trained medical scribe. The creation of this record is based on the scribe's personal observations and the provider's statements to them. This document has been checked and approved by the attending provider.

## 2021-06-20 ENCOUNTER — Encounter: Payer: Self-pay | Admitting: Oncology

## 2021-06-20 ENCOUNTER — Encounter: Payer: Self-pay | Admitting: *Deleted

## 2021-06-20 ENCOUNTER — Encounter: Payer: Self-pay | Admitting: Physical Therapy

## 2021-06-20 ENCOUNTER — Inpatient Hospital Stay: Payer: Medicare Other | Attending: Oncology

## 2021-06-20 ENCOUNTER — Ambulatory Visit
Admission: RE | Admit: 2021-06-20 | Discharge: 2021-06-20 | Disposition: A | Discharge status: Home / Self Care | Payer: Medicare Other | Source: Ambulatory Visit | Attending: Radiation Oncology | Admitting: Radiation Oncology

## 2021-06-20 ENCOUNTER — Other Ambulatory Visit: Payer: Self-pay

## 2021-06-20 ENCOUNTER — Encounter: Payer: Self-pay | Admitting: Radiation Oncology

## 2021-06-20 ENCOUNTER — Inpatient Hospital Stay (HOSPITAL_BASED_OUTPATIENT_CLINIC_OR_DEPARTMENT_OTHER): Payer: Medicare Other | Admitting: Oncology

## 2021-06-20 ENCOUNTER — Ambulatory Visit (HOSPITAL_BASED_OUTPATIENT_CLINIC_OR_DEPARTMENT_OTHER): Payer: Medicare Other | Admitting: Genetic Counselor

## 2021-06-20 ENCOUNTER — Ambulatory Visit: Payer: Medicare Other | Attending: Surgery | Admitting: Physical Therapy

## 2021-06-20 ENCOUNTER — Other Ambulatory Visit: Payer: Self-pay | Admitting: Surgery

## 2021-06-20 VITALS — BP 168/91 | HR 52 | Temp 97.4°F | Resp 18 | Ht 65.0 in | Wt 124.4 lb

## 2021-06-20 DIAGNOSIS — Z853 Personal history of malignant neoplasm of breast: Secondary | ICD-10-CM | POA: Diagnosis not present

## 2021-06-20 DIAGNOSIS — Z803 Family history of malignant neoplasm of breast: Secondary | ICD-10-CM | POA: Diagnosis not present

## 2021-06-20 DIAGNOSIS — C50411 Malignant neoplasm of upper-outer quadrant of right female breast: Secondary | ICD-10-CM | POA: Diagnosis not present

## 2021-06-20 DIAGNOSIS — Z85048 Personal history of other malignant neoplasm of rectum, rectosigmoid junction, and anus: Secondary | ICD-10-CM

## 2021-06-20 DIAGNOSIS — Z800 Family history of malignant neoplasm of digestive organs: Secondary | ICD-10-CM | POA: Insufficient documentation

## 2021-06-20 DIAGNOSIS — C50911 Malignant neoplasm of unspecified site of right female breast: Secondary | ICD-10-CM | POA: Diagnosis not present

## 2021-06-20 DIAGNOSIS — Z170 Estrogen receptor positive status [ER+]: Secondary | ICD-10-CM | POA: Diagnosis not present

## 2021-06-20 DIAGNOSIS — R293 Abnormal posture: Secondary | ICD-10-CM | POA: Diagnosis not present

## 2021-06-20 LAB — CMP (CANCER CENTER ONLY)
ALT: 15 U/L (ref 0–44)
AST: 19 U/L (ref 15–41)
Albumin: 4 g/dL (ref 3.5–5.0)
Alkaline Phosphatase: 96 U/L (ref 38–126)
Anion gap: 10 (ref 5–15)
BUN: 13 mg/dL (ref 8–23)
CO2: 25 mmol/L (ref 22–32)
Calcium: 9.1 mg/dL (ref 8.9–10.3)
Chloride: 109 mmol/L (ref 98–111)
Creatinine: 0.8 mg/dL (ref 0.44–1.00)
GFR, Estimated: >60 mL/min (ref >60)
Glucose, Bld: 94 mg/dL (ref 70–99)
Potassium: 4.2 mmol/L (ref 3.5–5.1)
Sodium: 144 mmol/L (ref 135–145)
Total Bilirubin: 0.4 mg/dL (ref 0.3–1.2)
Total Protein: 7.2 g/dL (ref 6.5–8.1)

## 2021-06-20 LAB — CBC WITH DIFFERENTIAL (CANCER CENTER ONLY)
Abs Immature Granulocytes: 0.02 10*3/uL (ref 0.00–0.07)
Basophils Absolute: 0 10*3/uL (ref 0.0–0.1)
Basophils Relative: 1 %
Eosinophils Absolute: 0.1 10*3/uL (ref 0.0–0.5)
Eosinophils Relative: 3 %
HCT: 33.5 % — ABNORMAL LOW (ref 36.0–46.0)
Hemoglobin: 10.9 g/dL — ABNORMAL LOW (ref 12.0–15.0)
Immature Granulocytes: 1 %
Lymphocytes Relative: 29 %
Lymphs Abs: 1.2 10*3/uL (ref 0.7–4.0)
MCH: 28.8 pg (ref 26.0–34.0)
MCHC: 32.5 g/dL (ref 30.0–36.0)
MCV: 88.6 fL (ref 80.0–100.0)
Monocytes Absolute: 0.4 10*3/uL (ref 0.1–1.0)
Monocytes Relative: 10 %
Neutro Abs: 2.3 10*3/uL (ref 1.7–7.7)
Neutrophils Relative %: 56 %
Platelet Count: 149 10*3/uL — ABNORMAL LOW (ref 150–400)
RBC: 3.78 MIL/uL — ABNORMAL LOW (ref 3.87–5.11)
RDW: 14 % (ref 11.5–15.5)
WBC Count: 4 10*3/uL (ref 4.0–10.5)
nRBC: 0 % (ref 0.0–0.2)

## 2021-06-20 NOTE — Progress Notes (Signed)
[place] Work  Initial Assessment   Tiffany Walsh is a 65 y.o. year old female accompanied by husband. Clinical Social Work was referred by [facility] for assessment of psychosocial needs.   SDOH (Social Determinants of Health) assessments performed: Yes SDOH Interventions    Flowsheet Row Most Recent Value  SDOH Interventions   Food Insecurity Interventions Intervention Not Indicated  Financial Strain Interventions Intervention Not Indicated  Housing Interventions Intervention Not Indicated  Transportation Interventions Intervention Not Indicated       Distress Screen completed: Yes ONCBCN DISTRESS SCREENING 06/20/2021  Screening Type Initial Screening  Distress experienced in past week (1-10) 5  Emotional problem type Nervousness/Anxiety;Adjusting to illness  Spiritual/Religous concerns type Facing my mortality      Family/Social Information:  Housing Arrangement: patient lives with husband Tiffany Walsh.  Family members/support persons in your life? Family and Church. Pt has two sons in [place] and lots of family in [place]. Transportation concerns: no  Employment: Working full time as a Scientist, water quality. Income source: Employment Financial concerns: No Type of concern: None Food access concerns: no Religious or spiritual practice: yes, pt attends church regularly and draws strength from Geneticist, molecular. Medication Concerns: no  Services Currently in place:  N/A  Coping/ Adjustment to diagnosis: Patient understands treatment plan and what happens next? yes Concerns about diagnosis and/or treatment:  no concerns stated at this time. Patient reported stressors: Anxiety, Adjusting to my illness, and Facing my mortality Patient enjoys time with family/ friends and going to church. Current coping skills/ strengths: Capable of independent living , Religious Affiliation , and Supportive family/friends     SUMMARY: Current SDOH Barriers:  None identified at this  time.  Interventions: Discussed common feeling and emotions when being diagnosed with cancer, and the importance of support during treatment Informed patient of the support team roles and support services at [facility] Provided [place] contact information and encouraged patient to call with any questions or concerns    Follow Up Plan: Patient will contact CSW with any support or resource needs Patient verbalizes understanding of plan: Yes   Rosary Lively, BSW Intern [facility] , LCSW

## 2021-06-20 NOTE — Progress Notes (Signed)
[place]  Telephone:(336) [phone] Fax:(336) [phone]     ID: Tiffany Walsh DOB: 07-20-1956  MR#: [id number]  QMG#:[id number]  Patient Care Team: Alroy Dust, Carlean Jews.Marlou Sa, MD as PCP - General (Family Medicine) Mauro Kaufmann, RN as Oncology Nurse Navigator Rockwell Germany, RN as Oncology Nurse Navigator Coralie Keens, MD as Consulting Physician (General Surgery) Eshan Trupiano, Virgie Dad, MD as Consulting Physician (Oncology) Eppie Gibson, MD as Attending Physician (Radiation Oncology) Jerline Pain, MD as Consulting Physician (Cardiology) Chauncey Cruel, MD OTHER MD:  CHIEF COMPLAINT: Estrogen receptor positive breast cancer  CURRENT TREATMENT: Awaiting definitive surgery   HISTORY OF CURRENT ILLNESS: Tiffany Walsh has a history of stage pT3, pN1 rectal adenocarcinoma, diagnosed in 11/2007. She underwent resection, concurrent chemoradiation with Xeloda, and adjuvant oxaliplatin and Xeloda concluded in 08/2008. She was under the care of Dr. Alen Blew.  She had routine screening mammography on 05/23/2021 showing a possible abnormality in the right breast. She underwent right diagnostic mammography with tomography and right breast ultrasonography at [facility] on 05/31/2021 showing: breast density category B; irregular 1 cm right breast mass at 10 o'clock.  Accordingly on 06/11/2021 she proceeded to biopsy of the right breast area in question. The pathology from this procedure ([id number]) showed: invasive mammary carcinoma, e-cadherin positive, grade 1; focal in situ carcinoma. Prognostic indicators significant for: estrogen receptor, 95% positive and progesterone receptor, 95% positive, both with strong staining intensity. Proliferation marker Ki67 at 10%. HER2 negative by immunohistochemistry (1+).  Cancer Staging Malignant neoplasm of upper-outer quadrant of right breast in female, estrogen receptor positive ([place]) Staging form: Breast, AJCC 8th Edition - Clinical stage from  06/20/2021: Stage IA (cT1b, cN0, cM0, G1, ER+, PR+, HER2-) - Signed by Chauncey Cruel, MD on 06/20/2021 Stage prefix: Initial diagnosis Histologic grading system: 3 grade system Laterality: Right Staged by: Pathologist and managing physician Stage used in treatment planning: Yes National guidelines used in treatment planning: Yes Type of national guideline used in treatment planning: NCCN  The patient's subsequent history is as detailed below.   INTERVAL HISTORY: [name] was evaluated in the multidisciplinary breast cancer clinic on 06/20/2021 accompanied by her husband [name]. Her case was also presented at the multidisciplinary breast cancer conference on the same day. At that time a preliminary plan was proposed: Breast conserving surgery with sentinel lymph node sampling; Oncotype, with chemotherapy not anticipated; adjuvant radiation; antiestrogens; genetics   REVIEW OF SYSTEMS: There were no specific symptoms leading to the original mammogram, which was routinely scheduled. On the provided questionnaire, Tiffany Walsh reports wearing glasses and dentures, chest pain, arthritis, how flashes, and anemia. The patient denies unusual headaches, visual changes, nausea, vomiting, stiff neck, dizziness, or gait imbalance. There has been no cough 65, phlegm production, or pleurisy, and no change in bowel or bladder habits. The patient denies fever, rash, bleeding, unexplained fatigue or unexplained weight loss. A detailed review of systems was otherwise entirely negative.   COVID 28 VACCINATION STATUS: Pfizer x2   PAST MEDICAL HISTORY: Past Medical History:  Diagnosis Date   Abdominal pain, LLQ    Anemia    Arthritis of right knee    Cervical dysplasia    CIN I (cervical intraepithelial neoplasia I)    CIN II (cervical intraepithelial neoplasia II)    Colon cancer (HCC)    Epigastric pain    GERD (gastroesophageal reflux disease)    Heme positive stool    History of rectal cancer    Hx SBO     Hypercholesterolemia  Hypertension    Malignant neoplasm of rectum, rectosigmoid junction, and anus    Nausea with vomiting, unspecified    Peripheral neuropathy due to chemotherapy [facility])    Rectal cancer (HCC)    stage 3   Vaginal delivery [id number]    PAST SURGICAL HISTORY: Past Surgical History:  Procedure Laterality Date   CERVICAL CONIZATION W/BX N/A 04/04/2015   Procedure: CONIZATION CERVIX WITH BIOPSY;  Surgeon: Thurnell Lose, MD;  Location: [facility];  Service: Gynecology;  Laterality: N/A;   COLON SURGERY     MOUTH SURGERY     widom teeth       FAMILY HISTORY: Family History  Problem Relation Age of Onset   Cancer Maternal Aunt        unknown type   Breast cancer Maternal Aunt    Colon cancer Maternal Uncle    Lung cancer Maternal Uncle    Cancer Maternal Grandmother        maybe stomach?   She has no information on her father or his side of the family. Her mother is currently 49 years old as of 05/2021. Tiffany Walsh has no siblings. On her mother's side of the family, she reports lung cancer in an uncle, colon cancer in another uncle, breast cancer in an aunt, cancer (stomach?) in her grandmother, and cancer in another aunt.   GYNECOLOGIC HISTORY:  No LMP recorded. Patient is postmenopausal. Menarche: 65 years old Age at first live birth: 65 years old Ald[place] P 2 LMP 2004 Contraceptive: used from 1976-1980 HRT never used  Hysterectomy? no BSO? 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 Husband [name] is a retired Glass blower/designer. She lives at home with [name]. Son Tiffany Walsh, age 83, is in the [employer] and stationed at [employer]. Daughter Tiffany Walsh, age 73, is a CNA in [place]. Tiffany Walsh has 5 grandchildren. She attends a Radiographer, therapeutic church.    ADVANCED DIRECTIVES: In the absence of any documentation to the contrary, the patient's spouse is their HCPOA.    HEALTH MAINTENANCE: Social History   Tobacco Use   Smoking  status: Former    Types: Cigarettes   Smokeless tobacco: Never  Substance Use Topics   Alcohol use: Yes    Comment: occasional    Drug use: No     Colonoscopy: 2017 ([facility])  PAP: 2020  Bone density: date unsure   No Known Allergies  Current Outpatient Medications  Medication Sig Dispense Refill   acetaminophen (TYLENOL) 325 MG tablet Take 650 mg by mouth every 6 (six) hours as needed for mild pain or headache.     aspirin EC 81 MG tablet Take 1 tablet (81 mg total) by mouth every other day. Swallow whole. 90 tablet 3   esomeprazole (NEXIUM) 40 MG capsule Take 40 mg by mouth daily at 12 noon.     famotidine (PEPCID) 20 MG tablet Take 20 mg by mouth 2 (two) times daily.     ferrous sulfate 325 (65 FE) MG EC tablet Take 1 tablet (325 mg total) by mouth every Monday, Wednesday, and Friday. Take one tablet twice a day. 30 tablet 0   metoprolol succinate (TOPROL-XL) 100 MG 24 hr tablet Take 100 mg by mouth daily. Take with or immediately following a meal.     rosuvastatin (CRESTOR) 20 MG tablet TAKE 1 TABLET BY MOUTH ONCE DAILY . APPOINTMENT REQUIRED FOR FUTURE REFILLS 90 tablet 3   verapamil (CALAN-SR) 240 MG CR tablet  Take 240 mg by mouth daily.      No current facility-administered medications for this visit.    OBJECTIVE: White woman who appears well  Vitals:   06/20/21 0848  BP: (!) 168/91  Pulse: (!) 52  Resp: 18  Temp: (!) 97.4 F (36.3 C)  SpO2: 100%     Body mass index is 20.7 kg/m.   Wt Readings from Last 3 Encounters:  06/20/21 124 lb 6.4 oz (56.4 kg)  04/30/21 122 lb (55.3 kg)  06/06/20 118 lb (53.5 kg)      ECOG FS:1 - Symptomatic but completely ambulatory  Ocular: Sclerae unicteric, pupils round and equal Ear-nose-throat: Wearing a mask Lymphatic: No cervical or supraclavicular adenopathy Lungs no rales or rhonchi Heart regular rate and rhythm Abd soft, nontender, positive bowel sounds MSK no focal spinal tenderness, no joint edema Neuro: non-focal,  well-oriented, appropriate affect Breasts: The right breast is status post recent biopsy.  There is a minimal ecchymosis.  Do I do not palpate a mass.  The left breast and both axillae are benign.   LAB RESULTS:  CMP     Component Value Date/Time   NA 144 06/20/2021 0831   NA 144 05/26/2020 0939   NA 144 05/09/2015 0923   K 4.2 06/20/2021 0831   K 3.9 05/09/2015 0923   CL 109 06/20/2021 0831   CL 101 10/27/2012 0913   CO2 25 06/20/2021 0831   CO2 26 05/09/2015 0923   GLUCOSE 94 06/20/2021 0831   GLUCOSE 92 05/09/2015 0923   GLUCOSE 83 10/27/2012 0913   BUN 13 06/20/2021 0831   BUN 11 05/26/2020 0939   BUN 12.6 05/09/2015 0923   CREATININE 0.80 06/20/2021 0831   CREATININE 0.8 05/09/2015 0923   CALCIUM 9.1 06/20/2021 0831   CALCIUM 8.5 05/09/2015 0923   PROT 7.2 06/20/2021 0831   PROT 6.9 05/09/2015 0923   ALBUMIN 4.0 06/20/2021 0831   ALBUMIN 3.7 05/09/2015 0923   AST 19 06/20/2021 0831   AST 13 05/09/2015 0923   ALT 15 06/20/2021 0831   ALT 10 05/09/2015 0923   ALKPHOS 96 06/20/2021 0831   ALKPHOS 76 05/09/2015 0923   BILITOT 0.4 06/20/2021 0831   BILITOT 0.27 05/09/2015 0923   GFRNONAA >60 06/20/2021 0831   GFRAA >60 06/06/2020 2141    No results found for: TOTALPROTELP, ALBUMINELP, A1GS, A2GS, BETS, BETA2SER, GAMS, MSPIKE, SPEI  Lab Results  Component Value Date   WBC 4.0 06/20/2021   NEUTROABS 2.3 06/20/2021   HGB 10.9 (L) 06/20/2021   HCT 33.5 (L) 06/20/2021   MCV 88.6 06/20/2021   PLT 149 (L) 06/20/2021    No results found for: LABCA2  No components found for: JHERDE081  No results for input(s): INR in the last 168 hours.  No results found for: LABCA2  No results found for: KGY185  No results found for: UDJ497  No results found for: WYO378  No results found for: CA2729  No components found for: HGQUANT  No results found for: CEA1 / No results found for: CEA1   No results found for: AFPTUMOR  No results found for: CHROMOGRNA  No  results found for: KPAFRELGTCHN, LAMBDASER, KAPLAMBRATIO (kappa/lambda light chains)  No results found for: HGBA, HGBA2QUANT, HGBFQUANT, HGBSQUAN (Hemoglobinopathy evaluation)   No results found for: LDH  No results found for: IRON, TIBC, IRONPCTSAT (Iron and TIBC)  No results found for: FERRITIN  Urinalysis    Component Value Date/Time   COLORURINE YELLOW 06/06/2020 2141  APPEARANCEUR HAZY (A) 06/06/2020 2141   LABSPEC 1.029 06/06/2020 2141   PHURINE 5.0 06/06/2020 2141   GLUCOSEU NEGATIVE 06/06/2020 2141   HGBUR MODERATE (A) 06/06/2020 2141   BILIRUBINUR NEGATIVE 06/06/2020 2141   KETONESUR 80 (A) 06/06/2020 2141   PROTEINUR 30 (A) 06/06/2020 2141   NITRITE NEGATIVE 06/06/2020 2141   LEUKOCYTESUR SMALL (A) 06/06/2020 2141     STUDIES: No results found.   ELIGIBLE FOR AVAILABLE RESEARCH PROTOCOL: no  ASSESSMENT: 65 y.o. [place] woman status post right breast upper outer quadrant biopsy 06/11/2021 for a clinical T1b N0, stage IA invasive ductal carcinoma, grade 1, estrogen and progesterone receptor positive, with an MIB-1 of 10% and HER2 not amplified.  (1) genetics testing   (2) definitive surgery pending  (3) Oncotype to be obtained from the definitive surgical sample, chemotherapy not anticipated  (4) adjuvant radiation  (5) antiestrogens  PLAN: I met today with Eirene to review her new diagnosis. Specifically we discussed the biology of her breast cancer, its diagnosis, staging, treatment  options and prognosis.We first reviewed the fact that cancer is not one disease but more than 100 different diseases and that it is important to keep them separate-- otherwise when friends and relatives discuss their own cancer experiences with Miyo confusion can result. Similarly we explained that if breast cancer spreads to the bone or liver, the patient would not have bone cancer or liver cancer, but breast cancer in the bone and breast cancer in the liver: one cancer  in three places-- not 3 different cancers which otherwise would have to be treated in 3 different ways.  We discussed the difference between local and systemic therapy. In terms of loco-regional treatment, lumpectomy plus radiation is equivalent to mastectomy as far as survival is concerned. For this reason, and because the cosmetic results are generally superior, we recommend breast conserving surgery.  We then discussed the rationale for systemic therapy. There is some risk that this cancer may have already spread to other parts of her body. Patients frequently ask at this point about bone scans, CAT scans and PET scans to find out if they have occult breast cancer somewhere else. The problem is that in early stage disease we are much more likely to find false positives then true cancers and this would expose the patient to unnecessary procedures as well as unnecessary radiation. Scans cannot answer the question the patient really would like to know, which is whether she has microscopic disease elsewhere in her body. For those reasons we do not recommend them.  Of course we would proceed to aggressive evaluation of any symptoms that might suggest metastatic disease, but that is not the case here.  Next we went over the options for systemic therapy which are anti-estrogens, anti-HER-2 immunotherapy, and chemotherapy. Khalia does not meet criteria for anti-HER-2 immunotherapy. She is a good candidate for anti-estrogens.  The question of chemotherapy is more complicated. Chemotherapy is most effective in rapidly growing, aggressive tumors. It is much less effective in low-grade, slow growing cancers, like Willetta 's. For that reason we are going to request an Oncotype from the definitive surgical sample, as suggested by NCCN guidelines. That will help [place] make a definitive decision regarding chemotherapy in this case.  Angelica also qualifies for genetics testing. In patients who carry a deleterious mutation  [for example in a  BRCA gene], the risk of a new breast cancer developing in the future may be sufficiently great that the patient may choose bilateral mastectomies. However  if she wishes to keep her breasts in that situation it is safe to do so. That would require intensified screening, which generally means not only yearly mammography but a yearly breast MRI as well.   Dyann has a good understanding of the overall plan. She agrees with it. She knows the goal of treatment in her case is cure. She will call with any problems that may develop before her next visit here.  Total encounter time 65 minutes.Sarajane Jews C. Makaelyn Aponte, MD 06/20/2021 6:02 PM Medical Oncology and Hematology [facility], [place] [address] Tel. [phone]    Fax. [phone]   This document serves as a record of services personally performed by Lurline Del, MD. It was created on his behalf by Wilburn Mylar, a trained medical scribe. The creation of this record is based on the scribe's personal observations and the provider's statements to them.   I, Lurline Del MD, have reviewed the above documentation for accuracy and completeness, and I agree with the above.    *Total Encounter Time as defined by the Centers for Medicare and Medicaid Services includes, in addition to the face-to-face time of a patient visit (documented in the note above) non-face-to-face time: obtaining and reviewing outside history, ordering and reviewing medications, tests or procedures, care coordination (communications with other health care professionals or caregivers) and documentation in the medical record.

## 2021-06-20 NOTE — Progress Notes (Signed)
REFERRING PROVIDER: Lurline Del, MD [address] [place], [place] [address]  PRIMARY PROVIDER:  Alroy Dust, L.Marlou Sa, MD  PRIMARY REASON FOR VISIT:  Encounter Diagnoses  Name Primary?   Malignant neoplasm of upper-outer quadrant of right breast in female, estrogen receptor positive ([place]) Yes   Personal history of rectal cancer    Family history of breast cancer    Family history of colon cancer    HISTORY OF PRESENT ILLNESS:   Ms. Lanuza, a 65 y.o. female, was seen for a [place] cancer genetics consultation during the breast multidisciplinary clinic at the request of Dr. Jana Hakim due to a personal and family history of cancer.  Ms. Christenberry presents to clinic today to discuss the possibility of a hereditary predisposition to cancer, to discuss genetic testing, and to further clarify her future cancer risks, as well as potential cancer risks for family members.   In September 2022, at the age of 39, Ms. Rosenstock was diagnosed with invasive mammary carcinoma with focal mammary carcinoma in situ of the right breast. The tumor is described as ER/PR positive and HER2 negative. The treatment plan is pending. Additionally, she has a history of rectal cancer diagnosed at age 75.   RISK FACTORS:  Menarche was at age 32.  First live birth at age 88.  OCP use for approximately 4 years.  Ovaries intact: yes.  Uterus intact: yes.  Menopausal status: postmenopausal, her last menstrual cycle was in 2004  HRT use: 0 years. Colonoscopy: yes; in 2017 Mammogram within the last year: yes. Any excessive radiation exposure in the past: no  Past Medical History:  Diagnosis Date   Abdominal pain, LLQ    Anemia    Arthritis of right knee    Cervical dysplasia    CIN I (cervical intraepithelial neoplasia I)    CIN II (cervical intraepithelial neoplasia II)    Colon cancer (HCC)    Epigastric pain    GERD (gastroesophageal reflux disease)    Heme positive stool    History of rectal cancer     Hx SBO    Hypercholesterolemia    Hypertension    Malignant neoplasm of rectum, rectosigmoid junction, and anus    Nausea with vomiting, unspecified    Peripheral neuropathy due to chemotherapy [facility])    Rectal cancer ([place])    stage 3   Vaginal delivery [id number]    Past Surgical History:  Procedure Laterality Date   CERVICAL CONIZATION W/BX N/A 04/04/2015   Procedure: CONIZATION CERVIX WITH BIOPSY;  Surgeon: Thurnell Lose, MD;  Location: [facility];  Service: Gynecology;  Laterality: N/A;   COLON SURGERY     MOUTH SURGERY     widom teeth       Social History   Socioeconomic History   Marital status: Married    Spouse name: Not on file   Number of children: Not on file   Years of education: Not on file   Highest education level: Not on file  Occupational History   Not on file  Tobacco Use   Smoking status: Former    Types: Cigarettes   Smokeless tobacco: Never  Substance and Sexual Activity   Alcohol use: Yes    Comment: occasional    Drug use: No   Sexual activity: Not on file  Other Topics Concern   Not on file  Social History Narrative   Not on file   Social Determinants of Health   Financial Resource Strain: Not on file  Food Insecurity:  Not on file  Transportation Needs: Not on file  Physical Activity: Not on file  Stress: Not on file  Social Connections: Not on file     FAMILY HISTORY:  We obtained a detailed, 4-generation family history.  Significant diagnoses are listed below:  Family History  Problem Relation Age of Onset   Cancer Maternal Aunt        unknown type   Breast cancer Maternal Aunt    Colon cancer Maternal Uncle    Lung cancer Maternal Uncle    Cancer Maternal Grandmother        maybe stomach?     Her maternal uncle was diagnosed with colon cancer at an unknown age, he is deceased. Another maternal uncle was diagnosed with lung cancer at an unknown age, he smoked, he is deceased. Her maternal aunt was diagnosed with breast cancer  (>62 years of age). A second maternal aunt was diagnosed with an unknown type of cancer. Her maternal grandmother was diagnosed with an unknown type of cancer (>10 years of age), she is deceased. Of note, she does not have any information about her paternal family history.   Ms. Stirn is unaware of previous family history of genetic testing for hereditary cancer risks. There no reported Ashkenazi Jewish ancestry.   GENETIC COUNSELING ASSESSMENT: Ms. Weitman is a 65 y.o. female with a personal and family history of cancer which is somewhat suggestive of a hereditary cancer syndrome and predisposition to cancer given her history of both colorectal and breast cancer. We, therefore, discussed and recommended the following at today's visit.   DISCUSSION: We discussed that 5 - 10% of cancer is hereditary, with most cases of hereditary breast cancer associated with mutations in BRCA1/2.  There are other genes that can be associated with hereditary breast and colon cancer syndromes. Type of cancer risk and level of risk are gene-specific. We discussed that testing is beneficial for several reasons including knowing how to follow individuals after completing their treatment, identifying whether potential treatment options would be beneficial, and understanding if other family members could be at risk for cancer and allowing them to undergo genetic testing.   We reviewed the characteristics, features and inheritance patterns of hereditary cancer syndromes. We also discussed genetic testing, including the appropriate family members to test, the process of testing, insurance coverage and turn-around-time for results. We discussed the implications of a negative, positive and/or variant of uncertain significant result. In order to get genetic test results in a timely manner so that Ms. Hun can use these genetic test results for surgical decisions, we recommended Ms. Richardson Landry pursue genetic testing for the BRCAplus  panel. Once complete, we recommend Ms. Enoch pursue reflex genetic testing to a more comprehensive gene panel.   Ms. Rawdon was offered a common hereditary cancer panel (47 genes) and an expanded pan-cancer panel (77 genes). Ms. Jorstad was informed of the benefits and limitations of each panel, including that expanded pan-cancer panels contain genes that do not have clear management guidelines at this point in time.  We also discussed that as the number of genes included on a panel increases, the chances of variants of uncertain significance increases.  After considering the benefits and limitations of each gene panel, Ms. Husby elected to have [facility] CustomNext Panel+RNAinsight (47 genes).   The CustomNext-Cancer+RNAinsight panel offered by [facility] includes sequencing and rearrangement analysis for the following 47 genes:  APC, ATM, AXIN2, BARD1, BMPR1A, BRCA1, BRCA2, BRIP1, CDH1, CDK4, CDKN2A, CHEK2, DICER1, EPCAM, GREM1,  HOXB13, MEN1, MLH1, MSH2, MSH3, MSH6, MUTYH, NBN, NF1, NF2, NTHL1, PALB2, PMS2, POLD1, POLE, PTEN, RAD51C, RAD51D, RECQL, RET, SDHA, SDHAF2, SDHB, SDHC, SDHD, SMAD4, SMARCA4, STK11, TP53, TSC1, TSC2, and VHL.  RNA data is routinely analyzed for use in variant interpretation for all genes.  We discussed that if her out of pocket cost for testing is over $100, the laboratory should contact her to discuss self-pay prices, patient pay assistance programs, if applicable, and other billing options.   PLAN: After considering the risks, benefits, and limitations, Ms. Warsame provided informed consent to pursue genetic testing and the blood sample was sent to [facility] for analysis of the BRCAplus with reflex to CustomNext Panel (47 genes). Results should be available within approximately 1-2 weeks' time, at which point they will be disclosed by telephone to Ms. Robello, as will any additional recommendations warranted by these results. Ms. Zawadzki will receive a summary of  her genetic counseling visit and a copy of her results once available. This information will also be available in Epic.   Ms. Sliwinski questions were answered to her satisfaction today. Our contact information was provided should additional questions or concerns arise. Thank you for the referral and allowing [place] to share in the care of your patient.   Lucille Passy, MS, [facility] Genetic Counselor [email or web address] (P) [phone]  The patient was seen for a total of 15 minutes in face-to-face genetic counseling.  The patient brought her husband. Drs. Magrinat, Lindi Adie and/or Burr Medico were available to discuss this case as needed.  _______________________________________________________________________ For Office Staff:  Number of people involved in session: 2 Was an Intern/ student involved with case: no

## 2021-06-20 NOTE — Therapy (Addendum)
[place], [place], [address] Phone: [phone]   Fax:  [phone]  Physical Therapy Evaluation  Patient Details  Name: Tiffany Walsh MRN: [id number] Date of Birth: 1956-09-07 Referring Provider (PT): Dr. Nedra Hai   Encounter Date: 06/20/2021   PT End of Session - 06/20/21 1028     Visit Number 1    Number of Visits 2    Date for PT Re-Evaluation 08/15/21    PT Start Time 0933    PT Stop Time 0958    PT Time Calculation (min) 25 min    Activity Tolerance Patient tolerated treatment well    Behavior During Therapy [facility] for tasks assessed/performed             Past Medical History:  Diagnosis Date   Abdominal pain, LLQ    Anemia    Arthritis of right knee    Cervical dysplasia    CIN I (cervical intraepithelial neoplasia I)    CIN II (cervical intraepithelial neoplasia II)    Colon cancer (HCC)    Epigastric pain    GERD (gastroesophageal reflux disease)    Heme positive stool    History of rectal cancer    Hx SBO    Hypercholesterolemia    Hypertension    Malignant neoplasm of rectum, rectosigmoid junction, and anus    Nausea with vomiting, unspecified    Peripheral neuropathy due to chemotherapy [facility])    Rectal cancer ([place])    stage 3   Vaginal delivery [id number]    Past Surgical History:  Procedure Laterality Date   CERVICAL CONIZATION W/BX N/A 04/04/2015   Procedure: CONIZATION CERVIX WITH BIOPSY;  Surgeon: Thurnell Lose, MD;  Location: [place] [facility];  Service: Gynecology;  Laterality: N/A;   COLON SURGERY     MOUTH SURGERY     widom teeth       There were no vitals filed for this visit.    Subjective Assessment - 06/20/21 1020     Subjective Patient reports she is here today to be seen by her medical team for her newly diagnosed right breast cancer.    Patient is accompained by: Family member    Pertinent History Patient was diagnosed on 05/23/2021 with right grade I  invasive ductal carcinoma breast cancer. It measures 1.2 cm and is located in the upper outer quadrant. It is ER/PR positive and HER2 negative with a Ki67 of 10%.    Patient Stated Goals Reduce lymphedema risk and learn post op HEP    Currently in Pain? No/denies                [name] PT Assessment - 06/20/21 0001       Assessment   Medical Diagnosis Right breast cancer    Referring Provider (PT) Dr. Nedra Hai    Onset Date/Surgical Date 05/23/21    Hand Dominance Right    Prior Therapy none      Precautions   Precautions Other (comment)    Precaution Comments active cancer      Restrictions   Weight Bearing Restrictions No      Balance Screen   Has the patient fallen in the past 6 months No    Has the patient had a decrease in activity level because of a fear of falling?  No    Is the patient reluctant to leave their home because of a fear of falling?  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      Cognition   Overall Cognitive Status Within Functional Limits for tasks assessed      Posture/Postural Control   Posture/Postural Control Postural limitations    Postural Limitations Rounded Shoulders;Forward head      ROM / Strength   AROM / PROM / Strength AROM;Strength      AROM   Overall AROM Comments Cervical AROM is WNL    AROM Assessment Site Shoulder    Right/Left Shoulder Right;Left    Right Shoulder Extension 52 Degrees    Right Shoulder Flexion 151 Degrees    Right Shoulder ABduction 161 Degrees    Right Shoulder Internal Rotation 58 Degrees    Right Shoulder External Rotation 88 Degrees    Left Shoulder Extension 58 Degrees    Left Shoulder Flexion 148 Degrees    Left Shoulder  ABduction 164 Degrees    Left Shoulder Internal Rotation 60 Degrees    Left Shoulder External Rotation 82 Degrees      Strength   Overall Strength Within functional limits for tasks performed               LYMPHEDEMA/ONCOLOGY QUESTIONNAIRE - 06/20/21 0001       Type   Cancer Type Right breast cancer      Lymphedema Assessments   Lymphedema Assessments Upper extremities      Right Upper Extremity Lymphedema   10 cm Proximal to Olecranon Process 23.4 cm    Olecranon Process 22.4 cm    10 cm Proximal to Ulnar Styloid Process 18.7 cm    Just Proximal to Ulnar Styloid Process 14.9 cm    Across Hand at [employer] 18.2 cm    At [place] of 2nd Digit 5.9 cm      Left Upper Extremity Lymphedema   10 cm Proximal to Olecranon Process 22.8 cm    Olecranon Process 21.5 cm    10 cm Proximal to Ulnar Styloid Process 17.4 cm    Just Proximal to Ulnar Styloid Process 14.3 cm    Across Hand at [employer] 17.7 cm    At [place] of 2nd Digit 5.7 cm             L-DEX FLOWSHEETS - 06/20/21 1000       L-DEX LYMPHEDEMA SCREENING   Measurement Type Unilateral    L-DEX MEASUREMENT EXTREMITY Upper Extremity    POSITION  Standing    DOMINANT SIDE Right    At Risk Side Right    BASELINE SCORE (UNILATERAL) -1.7            The patient was assessed using the L-Dex machine today to produce a lymphedema index baseline score. The patient will be reassessed on a regular basis (typically every 3 months) to obtain new L-Dex scores. If the score is > 6.5 points away from his/her baseline score indicating onset of subclinical lymphedema, it will be recommended to wear a compression garment for 4 weeks, 12 hours per day and then be reassessed. If the score continues to be > 6.5 points from baseline at reassessment, we will initiate lymphedema treatment. Assessing in this manner has a 95% rate of preventing clinically  significant lymphedema.       Katina Dung - 06/20/21 0001     Open a  tight or new jar No difficulty    Do heavy household chores (wash walls, wash floors) No difficulty    Carry a shopping bag or briefcase No difficulty    Wash your back No difficulty    Use a knife to cut food No difficulty    Recreational activities in which you take some force or impact through your arm, shoulder, or hand (golf, hammering, tennis) No difficulty    During the past week, to what extent has your arm, shoulder or hand problem interfered with your normal social activities with family, friends, neighbors, or groups? Not at all    During the past week, to what extent has your arm, shoulder or hand problem limited your work or other regular daily activities Not at all    Arm, shoulder, or hand pain. None    Tingling (pins and needles) in your arm, shoulder, or hand None    Difficulty Sleeping No difficulty    DASH Score 0 %              Objective measurements completed on examination: See above findings.       Patient was instructed today in a home exercise program today for post op shoulder range of motion. These included active assist shoulder flexion in sitting, scapular retraction, wall walking with shoulder abduction, and hands behind head external rotation.  She was encouraged to do these twice a day, holding 3 seconds and repeating 5 times when permitted by her physician.           PT Education - 06/20/21 1027     Education Details Lymphedema education and post op HEP    Person(s) Educated Patient;Spouse    Methods Explanation;Demonstration;Handout    Comprehension Returned demonstration;Verbalized understanding                 PT Long Term Goals - 06/20/21 1033       PT LONG TERM GOAL #1   Title Patient will demonstrate she has regained full shoulder ROM and function post operatively compared to baselines.    Time 8    Period Weeks    Status New    Target Date 08/15/21                    Plan - 06/20/21 1028     Clinical  Impression Statement Patient was diagnosed on 05/23/2021 with right grade I invasive ductal carcinoma breast cancer. It measures 1.2 cm and is located in the upper outer quadrant. It is ER/PR positive and HER2 negative with a Ki67 of 10%. Her multidisciplinary medical team met prior to her assessments to determine a recommended treatment plan. She is planning to have a right lumpectomy and sentinel node biopsy followed by Oncotype testing, radiation, and anti-estrogen therapy. She will benefit from a post op PT reassessment to determine needs and from L-Dex screens every 3 months for 2 years to detect subclinical lymphedema.    Stability/Clinical Decision Making Stable/Uncomplicated    Clinical Decision Making Low    Rehab Potential Excellent    PT Frequency --   Eval and 1 f/u visit   PT Treatment/Interventions ADLs/Self Care Home Management;Therapeutic exercise;Patient/family education    PT Next Visit Plan Will reassess 3-4 weeks post op    PT Home Exercise Plan Post op shoulder ROM HEP    Consulted and Agree  with Plan of Care Patient             Patient will benefit from skilled therapeutic intervention in order to improve the following deficits and impairments:  Postural dysfunction, Decreased range of motion, Decreased knowledge of precautions, Impaired UE functional use, Pain  Visit Diagnosis: Malignant neoplasm of upper-outer quadrant of right breast in female, estrogen receptor positive ([place]) - Plan: PT plan of care cert/re-cert  Abnormal posture - Plan: PT plan of care cert/re-cert  Patient will follow up at outpatient cancer rehab 3-4 weeks following surgery.  If the patient requires physical therapy at that time, a specific plan will be dictated and sent to the referring physician for approval. The patient was educated today on appropriate basic range of motion exercises to begin post operatively and the importance of attending the After Breast Cancer class following surgery.   Patient was educated today on lymphedema risk reduction practices as it pertains to recommendations that will benefit the patient immediately following surgery.  She verbalized good understanding.      Problem List Patient Active Problem List   Diagnosis Date Noted   Malignant neoplasm of upper-outer quadrant of right breast in female, estrogen receptor positive ([place]) 06/15/2021   Hypokalemia    SBO (small bowel obstruction) ([place]) 05/25/2019   Essential hypertension 05/25/2019   Annia Friendly, PT 06/20/21 10:46 AM   [place], [place], [address] Phone: [phone]   Fax:  [phone]  Name: ZOELLA ROBERTI MRN: [id number] Date of Birth: 1956/03/25

## 2021-06-20 NOTE — Patient Instructions (Signed)

## 2021-06-21 LAB — GENETIC SCREENING ORDER

## 2021-06-27 ENCOUNTER — Telehealth: Payer: Self-pay | Admitting: Genetic Counselor

## 2021-06-27 ENCOUNTER — Encounter: Payer: Self-pay | Admitting: Genetic Counselor

## 2021-06-27 ENCOUNTER — Telehealth: Payer: Self-pay | Admitting: *Deleted

## 2021-06-27 ENCOUNTER — Encounter: Payer: Self-pay | Admitting: *Deleted

## 2021-06-27 DIAGNOSIS — Z1379 Encounter for other screening for genetic and chromosomal anomalies: Secondary | ICD-10-CM | POA: Insufficient documentation

## 2021-06-27 NOTE — Telephone Encounter (Signed)
Spoke with patient to follow up [facility] and assess navigation needs. Patient denies any questions or concerns at this time.  Encouraged her to call should anything arise. Patient verbalized understanding.

## 2021-06-27 NOTE — Telephone Encounter (Signed)
I contacted Ms. Grantham to discuss her genetic testing results. No pathogenic variants were identified in the 8 genes analyzed. We are still waiting on an additional 39 genes and will contact her once those results are available.  The test report has been scanned into EPIC and is located under the Molecular Pathology section of the Results Review tab.  A portion of the result report is included below for reference. Detailed clinic note to follow.  Lucille Passy, MS, [facility] Genetic Counselor [email or web address] (P) [phone]

## 2021-07-03 DIAGNOSIS — I10 Essential (primary) hypertension: Secondary | ICD-10-CM | POA: Diagnosis not present

## 2021-07-04 ENCOUNTER — Other Ambulatory Visit: Payer: Self-pay

## 2021-07-04 ENCOUNTER — Encounter (HOSPITAL_BASED_OUTPATIENT_CLINIC_OR_DEPARTMENT_OTHER): Payer: Self-pay | Admitting: Surgery

## 2021-07-11 ENCOUNTER — Telehealth: Payer: Self-pay | Admitting: Genetic Counselor

## 2021-07-11 DIAGNOSIS — C50411 Malignant neoplasm of upper-outer quadrant of right female breast: Secondary | ICD-10-CM | POA: Diagnosis not present

## 2021-07-11 NOTE — Telephone Encounter (Signed)
I contacted Ms. Cederberg to discuss her genetic testing results. No pathogenic variants were identified in the 47 genes analyzed. Of note, a variant of uncertain significance was identified in the MSH6 gene.   The test report has been scanned into EPIC and is located under the Molecular Pathology section of the Results Review tab.  A portion of the result report is included below for reference. Detailed clinic note to follow.  Bailey Flippin, MS, LCGC Genetic Counselor [email or web address] (P) [phone]   

## 2021-07-11 NOTE — Progress Notes (Signed)

## 2021-07-11 NOTE — H&P (Signed)
REFERRING PHYSICIAN:  Magrinat, Virgie Dad, MD   PROVIDER:  Beverlee Nims, MD   MRN: [id number] DOB: 10-08-1955 DATE OF ENCOUNTER: 06/20/2021 Subjective  Chief Complaint: Breast Cancer     History of Present Illness: Tiffany Walsh is a 65 y.o. female who is seen today as an office consultation at the request of Dr. Jana Hakim for evaluation of Breast Cancer  .     Patient is a 65 year old female with recent abnormality of the right breast on screening mammography.  It measured 1.2 cm in size.  The 10 o'clock position 6 cm from the nipple.  She underwent a biopsy of the mass showing invasive ductal carcinoma which was 95% ER/PR positive, HER2 negative, and had a Ki-67 of 10%.  She has had no previous problems regarding her breast.  She had colorectal cancer surgery at age 27.  She has had no issues since then.   Review of Systems: A complete review of systems was obtained from the patient.  I have reviewed this information and discussed as appropriate with the patient.  See HPI as well for other ROS.   ROS    Medical History:     Past Medical History:  Diagnosis Date   Anemia     Arthritis     GERD (gastroesophageal reflux disease)     History of cancer     Hypertension           Patient Active Problem List  Diagnosis   Malignant neoplasm of upper-outer quadrant of right breast in female, estrogen receptor positive (CMS-HCC)           Past Surgical History:  Procedure Laterality Date   Cervial Conization with Biopsy N/A      04/04/2015   Colon Surgery N/A      2009   Mouth Surgery N/A      Date Unknown   Wisdom Teeth Extraction N/A      Date Unknown      No Known Allergies         Current Outpatient Medications on File Prior to Visit  Medication Sig Dispense Refill   ramipriL (ALTACE) 2.5 MG capsule Take 2.5 mg by mouth once daily       metoprolol succinate (TOPROL-XL) 100 MG XL tablet Take by mouth        No current facility-administered medications  on file prior to visit.      Family History  Family history unknown: Yes      Social History        Tobacco Use  Smoking Status Former Smoker  Smokeless Tobacco Never Used  Tobacco Comment    Quit smoking 20 years ago      Social History         Socioeconomic History   Marital status: Unknown  Tobacco Use   Smoking status: Former Smoker   Smokeless tobacco: Never Used   Tobacco comment: Quit smoking 20 years ago  Scientific laboratory technician Use: Never used  Substance and Sexual Activity   Alcohol use: Yes      Comment: 2 x weeks   Drug use: Never      Objective:  VS:  BP 136/78   Pulse (!) 57   Ht '5\' 5"'  (1.651 m)   Wt 122 lb (55.3 kg)   BMI 20.30 kg/m      Physical Exam    She appears well on exam   There are no palpable breast masses.  The nipple Complexes are normal.  There is no axillary adenopathy   Labs, Imaging and Diagnostic Testing: I reviewed her mammograms, ultrasound, and pathology results   Assessment and Plan:  Diagnoses and all orders for this visit:   Invasive ductal carcinoma of breast, female, right (CMS-HCC)       We have discussed her in detail in multidisciplinary breast cancer conference.  Again she has an invasive ductal right breast cancer.  We discussed breast conservation versus mastectomy.  She wishes to proceed with breast conservation.  We next proceeded with discussing a radioactive seed guided right breast lumpectomy and sentinel biopsy.  I explained the procedure to her husband in detail.  We discussed the risk of surgery which includes but are not limited to infection the need for further surgery if margins are positive, injury to surrounding structures, arm swelling, cardiopulmonary issues, postoperative recovery, etc.  After thorough discussion with patient and husband, they wish to proceed with breast conservation of right breast lumpectomy and sentinel node biopsy will be scheduled.

## 2021-07-12 ENCOUNTER — Ambulatory Visit (HOSPITAL_BASED_OUTPATIENT_CLINIC_OR_DEPARTMENT_OTHER): Payer: Medicare Other | Admitting: Certified Registered"

## 2021-07-12 ENCOUNTER — Other Ambulatory Visit: Payer: Self-pay

## 2021-07-12 ENCOUNTER — Encounter (HOSPITAL_BASED_OUTPATIENT_CLINIC_OR_DEPARTMENT_OTHER)
Admission: RE | Disposition: A | Discharge status: Home / Self Care | Payer: Self-pay | Source: Ambulatory Visit | Attending: Surgery

## 2021-07-12 ENCOUNTER — Encounter (HOSPITAL_BASED_OUTPATIENT_CLINIC_OR_DEPARTMENT_OTHER): Payer: Self-pay | Admitting: Surgery

## 2021-07-12 ENCOUNTER — Ambulatory Visit (HOSPITAL_BASED_OUTPATIENT_CLINIC_OR_DEPARTMENT_OTHER)
Admission: RE | Admit: 2021-07-12 | Discharge: 2021-07-12 | Disposition: A | Discharge status: Home / Self Care | Payer: Medicare Other | Source: Ambulatory Visit | Attending: Surgery | Admitting: Surgery

## 2021-07-12 DIAGNOSIS — Z170 Estrogen receptor positive status [ER+]: Secondary | ICD-10-CM | POA: Insufficient documentation

## 2021-07-12 DIAGNOSIS — Z87891 Personal history of nicotine dependence: Secondary | ICD-10-CM | POA: Insufficient documentation

## 2021-07-12 DIAGNOSIS — E7800 Pure hypercholesterolemia, unspecified: Secondary | ICD-10-CM | POA: Diagnosis not present

## 2021-07-12 DIAGNOSIS — Z79899 Other long term (current) drug therapy: Secondary | ICD-10-CM | POA: Diagnosis not present

## 2021-07-12 DIAGNOSIS — Z85048 Personal history of other malignant neoplasm of rectum, rectosigmoid junction, and anus: Secondary | ICD-10-CM | POA: Diagnosis not present

## 2021-07-12 DIAGNOSIS — G8918 Other acute postprocedural pain: Secondary | ICD-10-CM | POA: Diagnosis not present

## 2021-07-12 DIAGNOSIS — C50411 Malignant neoplasm of upper-outer quadrant of right female breast: Secondary | ICD-10-CM | POA: Diagnosis not present

## 2021-07-12 DIAGNOSIS — C50911 Malignant neoplasm of unspecified site of right female breast: Secondary | ICD-10-CM | POA: Diagnosis not present

## 2021-07-12 HISTORY — PX: BREAST LUMPECTOMY WITH RADIOACTIVE SEED AND SENTINEL LYMPH NODE BIOPSY: SHX6550

## 2021-07-12 SURGERY — BREAST LUMPECTOMY WITH RADIOACTIVE SEED AND SENTINEL LYMPH NODE BIOPSY
Anesthesia: Regional | Site: Breast | Laterality: Right

## 2021-07-12 MED ORDER — ONDANSETRON HCL 4 MG/2ML IJ SOLN
INTRAMUSCULAR | Status: DC | PRN
  Administered 2021-07-12: 4 mg via INTRAVENOUS

## 2021-07-12 MED ORDER — ROPIVACAINE HCL 5 MG/ML IJ SOLN
INTRAMUSCULAR | Status: DC | PRN
  Administered 2021-07-12: 25 mL

## 2021-07-12 MED ORDER — LIDOCAINE 2% (20 MG/ML) 5 ML SYRINGE
INTRAMUSCULAR | Status: AC
  Filled 2021-07-12: qty 5

## 2021-07-12 MED ORDER — PROPOFOL 10 MG/ML IV BOLUS
INTRAVENOUS | Status: AC
  Filled 2021-07-12: qty 20

## 2021-07-12 MED ORDER — AMISULPRIDE (ANTIEMETIC) 5 MG/2ML IV SOLN: 10.0000 mg | Freq: Once | INTRAVENOUS | Status: DC | PRN

## 2021-07-12 MED ORDER — FENTANYL CITRATE (PF) 100 MCG/2ML IJ SOLN
100.0000 ug | Freq: Once | INTRAMUSCULAR | Status: AC
  Administered 2021-07-12: 100 ug via INTRAVENOUS

## 2021-07-12 MED ORDER — BUPIVACAINE-EPINEPHRINE 0.5% -1:200000 IJ SOLN
INTRAMUSCULAR | Status: DC | PRN
  Administered 2021-07-12: 16 mL

## 2021-07-12 MED ORDER — LACTATED RINGERS IV SOLN: INTRAVENOUS | Status: DC

## 2021-07-12 MED ORDER — CHLORHEXIDINE GLUCONATE CLOTH 2 % EX PADS
6.0000 | MEDICATED_PAD | Freq: Once | CUTANEOUS | Status: AC
  Administered 2021-07-12: 6 via TOPICAL

## 2021-07-12 MED ORDER — FENTANYL CITRATE (PF) 100 MCG/2ML IJ SOLN
INTRAMUSCULAR | Status: AC
  Filled 2021-07-12: qty 2

## 2021-07-12 MED ORDER — LIDOCAINE HCL (CARDIAC) PF 100 MG/5ML IV SOSY
PREFILLED_SYRINGE | INTRAVENOUS | Status: DC | PRN
  Administered 2021-07-12: 60 mg via INTRAVENOUS

## 2021-07-12 MED ORDER — MIDAZOLAM HCL 2 MG/2ML IJ SOLN
1.0000 mg | Freq: Once | INTRAMUSCULAR | Status: AC
  Administered 2021-07-12: 1 mg via INTRAVENOUS

## 2021-07-12 MED ORDER — CLONIDINE HCL (ANALGESIA) 100 MCG/ML EP SOLN
EPIDURAL | Status: DC | PRN
  Administered 2021-07-12: 50 ug

## 2021-07-12 MED ORDER — ONDANSETRON HCL 4 MG/2ML IJ SOLN
INTRAMUSCULAR | Status: AC
  Filled 2021-07-12: qty 2

## 2021-07-12 MED ORDER — KETOROLAC TROMETHAMINE 30 MG/ML IJ SOLN: 30.0000 mg | Freq: Once | INTRAMUSCULAR | Status: DC | PRN

## 2021-07-12 MED ORDER — FENTANYL CITRATE (PF) 100 MCG/2ML IJ SOLN
INTRAMUSCULAR | Status: DC | PRN
  Administered 2021-07-12: 25 ug via INTRAVENOUS

## 2021-07-12 MED ORDER — PROPOFOL 10 MG/ML IV BOLUS
INTRAVENOUS | Status: DC | PRN
  Administered 2021-07-12: 150 mg via INTRAVENOUS

## 2021-07-12 MED ORDER — ACETAMINOPHEN 500 MG PO TABS
1000.0000 mg | ORAL_TABLET | ORAL | Status: AC
  Administered 2021-07-12: 1000 mg via ORAL

## 2021-07-12 MED ORDER — CHLORHEXIDINE GLUCONATE CLOTH 2 % EX PADS: 6.0000 | MEDICATED_PAD | Freq: Once | CUTANEOUS | Status: DC

## 2021-07-12 MED ORDER — MIDAZOLAM HCL 2 MG/2ML IJ SOLN
INTRAMUSCULAR | Status: AC
  Filled 2021-07-12: qty 2

## 2021-07-12 MED ORDER — CEFAZOLIN SODIUM-DEXTROSE 2-4 GM/100ML-% IV SOLN
2.0000 g | INTRAVENOUS | Status: AC
  Administered 2021-07-12: 2 g via INTRAVENOUS

## 2021-07-12 MED ORDER — DEXAMETHASONE SODIUM PHOSPHATE 10 MG/ML IJ SOLN
INTRAMUSCULAR | Status: AC
  Filled 2021-07-12: qty 1

## 2021-07-12 MED ORDER — MAGTRACE LYMPHATIC TRACER
INTRAMUSCULAR | Status: DC | PRN
  Administered 2021-07-12: 2 mL via INTRAMUSCULAR

## 2021-07-12 MED ORDER — DEXAMETHASONE SODIUM PHOSPHATE 10 MG/ML IJ SOLN
INTRAMUSCULAR | Status: DC | PRN
  Administered 2021-07-12: 10 mg via INTRAVENOUS

## 2021-07-12 MED ORDER — TRAMADOL HCL 50 MG PO TABS: 50.0000 mg | ORAL_TABLET | Freq: Four times a day (QID) | ORAL | 0 refills | Status: DC | PRN

## 2021-07-12 MED ORDER — FENTANYL CITRATE (PF) 100 MCG/2ML IJ SOLN
25.0000 ug | INTRAMUSCULAR | Status: DC | PRN
  Administered 2021-07-12: 50 ug via INTRAVENOUS

## 2021-07-12 MED ORDER — CEFAZOLIN SODIUM-DEXTROSE 2-4 GM/100ML-% IV SOLN
INTRAVENOUS | Status: AC
  Filled 2021-07-12: qty 100

## 2021-07-12 MED ORDER — ACETAMINOPHEN 500 MG PO TABS
ORAL_TABLET | ORAL | Status: AC
  Filled 2021-07-12: qty 2

## 2021-07-12 SURGICAL SUPPLY — 48 items
ADH SKN CLS APL DERMABOND .7 (GAUZE/BANDAGES/DRESSINGS) ×1
APL PRP STRL LF DISP 70% ISPRP (MISCELLANEOUS) ×1
APPLIER CLIP 9.375 MED OPEN (MISCELLANEOUS) ×2
APR CLP MED 9.3 20 MLT OPN (MISCELLANEOUS) ×1
BINDER BREAST LRG (GAUZE/BANDAGES/DRESSINGS) ×1 IMPLANT
BLADE SURG 15 STRL LF DISP TIS (BLADE) ×1 IMPLANT
BLADE SURG 15 STRL SS (BLADE) ×2
CANISTER SUCT 1200ML W/VALVE (MISCELLANEOUS) IMPLANT
CHLORAPREP W/TINT 26 (MISCELLANEOUS) ×2 IMPLANT
CLIP APPLIE 9.375 MED OPEN (MISCELLANEOUS) ×1 IMPLANT
COVER BACK TABLE 60X90IN (DRAPES) ×2 IMPLANT
COVER MAYO STAND STRL (DRAPES) ×2 IMPLANT
COVER PROBE W GEL 5X96 (DRAPES) ×2 IMPLANT
DECANTER SPIKE VIAL GLASS SM (MISCELLANEOUS) IMPLANT
DERMABOND ADVANCED (GAUZE/BANDAGES/DRESSINGS) ×1
DERMABOND ADVANCED .7 DNX12 (GAUZE/BANDAGES/DRESSINGS) ×1 IMPLANT
DRAPE LAPAROSCOPIC ABDOMINAL (DRAPES) ×2 IMPLANT
DRAPE UTILITY XL STRL (DRAPES) ×2 IMPLANT
ELECT REM PT RETURN 9FT ADLT (ELECTROSURGICAL) ×2
ELECTRODE REM PT RTRN 9FT ADLT (ELECTROSURGICAL) ×1 IMPLANT
GLOVE SURG POLYISO LF SZ6.5 (GLOVE) ×1 IMPLANT
GLOVE SURG POLYISO LF SZ7 (GLOVE) ×1 IMPLANT
GLOVE SURG SIGNA 7.5 PF LTX (GLOVE) ×2 IMPLANT
GLOVE SURG UNDER POLY LF SZ6.5 (GLOVE) ×1 IMPLANT
GOWN STRL REUS W/ TWL LRG LVL3 (GOWN DISPOSABLE) ×1 IMPLANT
GOWN STRL REUS W/ TWL XL LVL3 (GOWN DISPOSABLE) ×1 IMPLANT
GOWN STRL REUS W/TWL LRG LVL3 (GOWN DISPOSABLE) ×4
GOWN STRL REUS W/TWL XL LVL3 (GOWN DISPOSABLE) ×2
KIT MARKER MARGIN INK (KITS) ×2 IMPLANT
NDL HYPO 25X1 1.5 SAFETY (NEEDLE) ×1 IMPLANT
NDL SAFETY ECLIPSE 18X1.5 (NEEDLE) ×1 IMPLANT
NEEDLE HYPO 18GX1.5 SHARP (NEEDLE) ×2
NEEDLE HYPO 25X1 1.5 SAFETY (NEEDLE) ×2 IMPLANT
NS IRRIG 1000ML POUR BTL (IV SOLUTION) ×2 IMPLANT
PACK BASIN DAY SURGERY FS (CUSTOM PROCEDURE TRAY) ×2 IMPLANT
PENCIL SMOKE EVACUATOR (MISCELLANEOUS) ×2 IMPLANT
SLEEVE SCD COMPRESS KNEE MED (STOCKING) ×2 IMPLANT
SPONGE T-LAP 4X18 ~~LOC~~+RFID (SPONGE) ×2 IMPLANT
SUT MNCRL AB 4-0 PS2 18 (SUTURE) ×2 IMPLANT
SUT SILK 2 0 SH (SUTURE) IMPLANT
SUT VIC AB 3-0 SH 27 (SUTURE) ×2
SUT VIC AB 3-0 SH 27X BRD (SUTURE) ×1 IMPLANT
SYR CONTROL 10ML LL (SYRINGE) ×2 IMPLANT
TOWEL GREEN STERILE FF (TOWEL DISPOSABLE) ×2 IMPLANT
TRACER MAGTRACE VIAL (MISCELLANEOUS) ×1 IMPLANT
TRAY FAXITRON CT DISP (TRAY / TRAY PROCEDURE) ×2 IMPLANT
TUBE CONNECTING 20X1/4 (TUBING) ×1 IMPLANT
YANKAUER SUCT BULB TIP NO VENT (SUCTIONS) ×1 IMPLANT

## 2021-07-12 NOTE — Anesthesia Postprocedure Evaluation (Signed)
Anesthesia Post Note  Patient: KENSLI BOWLEY  Procedure(s) Performed: RIGHT BREAST LUMPECTOMY WITH RADIOACTIVE SEED AND SENTINEL LYMPH NODE BIOPSY (Right: Breast)     Patient location during evaluation: PACU Anesthesia Type: Regional and General Level of consciousness: awake and alert Pain management: pain level controlled Vital Signs Assessment: post-procedure vital signs reviewed and stable Respiratory status: spontaneous breathing, nonlabored ventilation, respiratory function stable and patient connected to nasal cannula oxygen Cardiovascular status: blood pressure returned to baseline and stable Postop Assessment: no apparent nausea or vomiting Anesthetic complications: no   No notable events documented.  Last Vitals:  Vitals:   07/12/21 1100 07/12/21 1115  BP: (!) 135/95 (!) 144/75  Pulse: (!) 55 (!) 54  Resp: 11 16  Temp:  (!) 36.1 C  SpO2: 97% 97%    Last Pain:  Vitals:   07/12/21 1115  TempSrc:   PainSc: 2                  March Rummage Alin Chavira

## 2021-07-12 NOTE — Op Note (Signed)
   Tiffany Walsh 07/12/2021   Pre-op Diagnosis: RIGHT BREAST CANCER     Post-op Diagnosis: same  Procedure(s): RIGHT BREAST LUMPECTOMY WITH RADIOACTIVE SEED AND DEEP RIGHT AXILLARY SENTINEL LYMPH NODE BIOPSY  Surgeon(s): Coralie Keens, MD  Anesthesia: General  Staff:  Circulator: Izora Ribas, RN Scrub Person: Donnita Falls, RN; Danford Bad D  Estimated Blood Loss: Minimal               Specimens: sent to path  Indications: This is a 65 year old female recently diagnosed with a right breast cancer.  She presents today for a radioactive seed guided lumpectomy of the right breast with sentinel lymph node biopsy  Procedure: Patient was identified in the preoperative holding area.  Under sterile technique I injected mag trace underneath the nipple areolar complex on the right.  She was taken to the operating room after a pec block was performed by anesthesiology.  She was placed upon the operating table general anesthesia was induced.  I then gently massaged to the right breast.  Her right breast and axilla were then prepped and draped in usual sterile fashion.  The radioactive seed was located approximately 8 to 10 cm from the nipple areolar complex toward the axilla.  I elected to make an incision in the axilla to remove both of breast cancer and the sentinel nodes.  I anesthetized the skin with Marcaine and then made incision with a scalpel in the axilla on the right side.  I then dissected into the breast tissue electrocautery.  I then dissected medially and down toward the chest wall where the radioactive seed was located with a neoprobe.  I identified the seed and performed a wide lumpectomy staying around the seed with aid of the neoprobe.  Again I took this all the way down to the chest wall.  Once the lumpectomy specimen was removed I marked the margins with paint.  An x-ray was performed on the specimen confirming that the radioactive seed and the previous biopsy clip  were in the specimen.  The specimen was sent to pathology for evaluation. I next dissected down into the deep right axillary tissue.  With the aid of the mag trace probe I was able to identify several sentinel lymph nodes which were removed together en bloc with the cautery.  I could visually see uptake of the mag trace.  Once the nodes were removed it was sent to pathology for evaluation.  Hemostasis appeared to be achieved both in the axilla and at the lumpectomy site.  I placed surgical clips at the lumpectomy site for marker purposes.  I then closed the subtenons tissue with interrupted 3-0 Vicryl sutures and closed the skin with a running 4-0 Monocryl.  Dermabond was then applied.  The patient tolerated the procedure well.  All the counts were correct at the end of the procedure.  The patient was then extubated in the operating room and taken in stable addition to the recovery room.          Coralie Keens   Date: 07/12/2021  Time: 10:09 AM he specimen

## 2021-07-12 NOTE — Anesthesia Procedure Notes (Addendum)
Anesthesia Regional Block: Pectoralis block   Pre-Anesthetic Checklist: , timeout performed,  Correct Patient, Correct Site, Correct Laterality,  Correct Procedure, Correct Position, site marked,  Risks and benefits discussed,  Surgical consent,  Pre-op evaluation,  At surgeon's request and post-op pain management  Laterality: Right  Prep: Dura Prep       Needles:  Injection technique: Single-shot  Needle Type: Echogenic Stimulator Needle     Needle Length: 10cm  Needle Gauge: 20     Additional Needles:   Procedures:,,,, ultrasound used (permanent image in chart),,    Narrative:  Start time: 07/12/2021 8:22 AM End time: 07/12/2021 8:26 AM Injection made incrementally with aspirations every 5 mL.  Performed by: Personally  Anesthesiologist: Darral Dash, DO  Additional Notes: Patient identified. Risks/Benefits/Options discussed with patient including but not limited to bleeding, infection, nerve damage, failed block, incomplete pain control. Patient expressed understanding and wished to proceed. All questions were answered. Sterile technique was used throughout the entire procedure. Please see nursing notes for vital signs. Aspirated in 5cc intervals with injection for negative confirmation. Patient was given instructions on fall risk and not to get out of bed. All questions and concerns addressed with instructions to call with any issues or inadequate analgesia.

## 2021-07-12 NOTE — Discharge Instructions (Addendum)
[place] Office Phone Number [phone]  BREAST BIOPSY/ PARTIAL MASTECTOMY: POST OP INSTRUCTIONS  Always review your discharge instruction sheet given to you by the facility where your surgery was performed.  IF YOU HAVE DISABILITY OR FAMILY LEAVE FORMS, YOU MUST BRING THEM TO THE OFFICE FOR PROCESSING.  DO NOT GIVE THEM TO YOUR DOCTOR.  A prescription for pain medication may be given to you upon discharge.  Take your pain medication as prescribed, if needed.  If narcotic pain medicine is not needed, then you may take acetaminophen (Tylenol) or ibuprofen (Advil) as needed. Take your usually prescribed medications unless otherwise directed If you need a refill on your pain medication, please contact your pharmacy.  They will contact our office to request authorization.  Prescriptions will not be filled after 5pm or on week-ends. You should eat very light the first 24 hours after surgery, such as soup, crackers, pudding, etc.  Resume your normal diet the day after surgery. Most patients will experience some swelling and bruising in the breast.  Ice packs and a good support bra will help.  Swelling and bruising can take several days to resolve.  It is common to experience some constipation if taking pain medication after surgery.  Increasing fluid intake and taking a stool softener will usually help or prevent this problem from occurring.  A mild laxative (Milk of Magnesia or Miralax) should be taken according to package directions if there are no bowel movements after 48 hours. Unless discharge instructions indicate otherwise, you may remove your bandages 24-48 hours after surgery, and you may shower at that time.  You may have steri-strips (small skin tapes) in place directly over the incision.  These strips should be left on the skin for 7-10 days.  If your surgeon used skin glue on the incision, you may shower in 24 hours.  The glue will flake off over the next 2-3 weeks.  Any  sutures or staples will be removed at the office during your follow-up visit. ACTIVITIES:  You may resume regular daily activities (gradually increasing) beginning the next day.  Wearing a good support bra or sports bra minimizes pain and swelling.  You may have sexual intercourse when it is comfortable. You may drive when you no longer are taking prescription pain medication, you can comfortably wear a seatbelt, and you can safely maneuver your car and apply brakes. RETURN TO WORK:  ______________________________________________________________________________________ Dennis Bast should see your doctor in the office for a follow-up appointment approximately two weeks after your surgery.  Your doctor's nurse will typically make your follow-up appointment when she calls you with your pathology report.  Expect your pathology report 2-3 business days after your surgery.  You may call to check if you do not hear from [place] after three days. OTHER INSTRUCTIONS: YOU MAY REMOVE THE BINDER TOMORROW MORNING AND START SHOWERING ICE PACK, TYLENOL, AND IBUPROFEN ALSO FOR PAIN NO VIGOROUS ACTIVITY FOR ONE WEEK _______________________________________________________________________________________________ _____________________________________________________________________________________________________________________________________ _____________________________________________________________________________________________________________________________________ _____________________________________________________________________________________________________________________________________  WHEN TO CALL YOUR DOCTOR: Fever over 101.0 Nausea and/or vomiting. Extreme swelling or bruising. Continued bleeding from incision. Increased pain, redness, or drainage from the incision.  The clinic staff is available to answer your questions during regular business hours.  Please don't hesitate to call and ask to speak to  one of the nurses for clinical concerns.  If you have a medical emergency, go to the nearest emergency room or call 911.  A surgeon from [facility] is always on call at the hospital.  For further questions, please visit [email or web address]      Post Anesthesia Home Care Instructions  Activity: Get plenty of rest for the remainder of the day. A responsible individual must stay with you for 24 hours following the procedure.  For the next 24 hours, DO NOT: -Drive a car -Paediatric nurse -Drink alcoholic beverages -Take any medication unless instructed by your physician -Make any legal decisions or sign important papers.  Meals: Start with liquid foods such as gelatin or soup. Progress to regular foods as tolerated. Avoid greasy, spicy, heavy foods. If nausea and/or vomiting occur, drink only clear liquids until the nausea and/or vomiting subsides. Call your physician if vomiting continues.  Special Instructions/Symptoms: Your throat may feel dry or sore from the anesthesia or the breathing tube placed in your throat during surgery. If this causes discomfort, gargle with warm salt water. The discomfort should disappear within 24 hours.  If you had a scopolamine patch placed behind your ear for the management of post- operative nausea and/or vomiting:  1. The medication in the patch is effective for 72 hours, after which it should be removed.  Wrap patch in a tissue and discard in the trash. Wash hands thoroughly with soap and water. 2. You may remove the patch earlier than 72 hours if you experience unpleasant side effects which may include dry mouth, dizziness or visual disturbances. 3. Avoid touching the patch. Wash your hands with soap and water after contact with the patch.     *May have Tylenol at 1:30pm today 07/12/21

## 2021-07-12 NOTE — Anesthesia Preprocedure Evaluation (Addendum)
Anesthesia Evaluation  Patient identified by MRN, date of birth, ID band Patient awake    Reviewed: Allergy & Precautions, NPO status , Patient's Chart, lab work & pertinent test results  Airway Mallampati: II  TM Distance: >3 FB Neck ROM: Full    Dental  (+) Upper Dentures, Partial Lower   Pulmonary neg pulmonary ROS, former smoker,    Pulmonary exam normal        Cardiovascular hypertension, Pt. on medications  Rhythm:Regular Rate:Normal     Neuro/Psych negative neurological ROS  negative psych ROS   GI/Hepatic Neg liver ROS, GERD  Medicated,Rectal cancer   Endo/Other  negative endocrine ROS  Renal/GU negative Renal ROS  negative genitourinary   Musculoskeletal  (+) Arthritis , Osteoarthritis,  Right breast Ca   Abdominal (+)  Abdomen: soft. Bowel sounds: normal.  Peds  Hematology  (+) anemia ,   Anesthesia Other Findings   Reproductive/Obstetrics                           Anesthesia Physical Anesthesia Plan  ASA: 3  Anesthesia Plan: General and Regional   Post-op Pain Management:  Regional for Post-op pain   Induction: Intravenous  PONV Risk Score and Plan: 3 and Ondansetron, Dexamethasone, Midazolam and Treatment may vary due to age or medical condition  Airway Management Planned: Mask and LMA  Additional Equipment: None  Intra-op Plan:   Post-operative Plan: Extubation in OR  Informed Consent: I have reviewed the patients History and Physical, chart, labs and discussed the procedure including the risks, benefits and alternatives for the proposed anesthesia with the patient or authorized representative who has indicated his/her understanding and acceptance.     Dental advisory given  Plan Discussed with: CRNA  Anesthesia Plan Comments:         Anesthesia Quick Evaluation

## 2021-07-12 NOTE — Transfer of Care (Signed)
Immediate Anesthesia Transfer of Care Note  Patient: Tiffany Walsh  Procedure(s) Performed: RIGHT BREAST LUMPECTOMY WITH RADIOACTIVE SEED AND SENTINEL LYMPH NODE BIOPSY (Right: Breast)  Patient Location: PACU  Anesthesia Type:GA combined with regional for post-op pain  Level of Consciousness: awake, alert  and oriented  Airway & Oxygen Therapy: Patient Spontanous Breathing and Patient connected to face mask oxygen  Post-op Assessment: Report given to RN and Post -op Vital signs reviewed and stable  Post vital signs: Reviewed and stable  Last Vitals:  Vitals Value Taken Time  BP 148/97 07/12/21 1015  Temp    Pulse 60 07/12/21 1016  Resp    SpO2 99 % 07/12/21 1016  Vitals shown include unvalidated device data.  Last Pain:  Vitals:   07/12/21 0705  TempSrc: Oral  PainSc: 0-No pain      Patients Stated Pain Goal: 5 (73/56/70 1410)  Complications: No notable events documented.

## 2021-07-12 NOTE — Progress Notes (Signed)
Assisted Dr. Jana Half with right, ultrasound guided, pectoralis block. Side rails up, monitors on throughout procedure. See vital signs in flow sheet. Tolerated Procedure well.

## 2021-07-12 NOTE — Anesthesia Procedure Notes (Signed)
Procedure Name: LMA Insertion Date/Time: 07/12/2021 9:02 AM Performed by: Lavonia Dana, CRNA Pre-anesthesia Checklist: Patient identified, Emergency Drugs available, Suction available and Patient being monitored Patient Re-evaluated:Patient Re-evaluated prior to induction Oxygen Delivery Method: Circle system utilized Preoxygenation: Pre-oxygenation with 100% oxygen Induction Type: IV induction Ventilation: Mask ventilation without difficulty LMA: LMA inserted LMA Size: 4.0 Number of attempts: 1 Airway Equipment and Method: Bite block Placement Confirmation: positive ETCO2 Tube secured with: Tape Dental Injury: Teeth and Oropharynx as per pre-operative assessment

## 2021-07-12 NOTE — Interval H&P Note (Signed)
History and Physical Interval Note: no change in H and P  07/12/2021 7:05 AM  Tiffany Walsh  has presented today for surgery, with the diagnosis of RIGHT BREAST CANCER.  The various methods of treatment have been discussed with the patient and family. After consideration of risks, benefits and other options for treatment, the patient has consented to  Procedure(s): RIGHT BREAST LUMPECTOMY WITH RADIOACTIVE SEED AND SENTINEL LYMPH NODE BIOPSY (Right) as a surgical intervention.  The patient's history has been reviewed, patient examined, no change in status, stable for surgery.  I have reviewed the patient's chart and labs.  Questions were answered to the patient's satisfaction.     Coralie Keens

## 2021-07-13 ENCOUNTER — Encounter (HOSPITAL_BASED_OUTPATIENT_CLINIC_OR_DEPARTMENT_OTHER): Payer: Self-pay | Admitting: Surgery

## 2021-07-13 NOTE — Addendum Note (Signed)
Addendum  created 07/13/21 1341 by Gloris Manchester March Rummage, DO   Clinical Note Signed, Intraprocedure Blocks edited, SmartForm saved

## 2021-07-16 LAB — SURGICAL PATHOLOGY

## 2021-07-17 ENCOUNTER — Encounter: Payer: Self-pay | Admitting: *Deleted

## 2021-07-17 DIAGNOSIS — I10 Essential (primary) hypertension: Secondary | ICD-10-CM | POA: Diagnosis not present

## 2021-07-17 DIAGNOSIS — C50919 Malignant neoplasm of unspecified site of unspecified female breast: Secondary | ICD-10-CM | POA: Diagnosis not present

## 2021-07-17 NOTE — Progress Notes (Signed)
[place] CSW Progress Notes  Social work Theatre manager spoke to patient by phone per [facility] follow up. Patient reports that her lumpectomy on 10/20 went well and that Dr. Ninfa Linden is pleased with results. Patient is recovering well and has no additional needs at this time, and will contact CSW if anything comes up.  Rosary Lively, Social Work Intern Supervised by Gwinda Maine, LCSW

## 2021-07-18 ENCOUNTER — Telehealth: Payer: Self-pay | Admitting: *Deleted

## 2021-07-18 ENCOUNTER — Encounter: Payer: Self-pay | Admitting: *Deleted

## 2021-07-18 NOTE — Telephone Encounter (Signed)
Ordered oncotype per Dr.Magrinat. faxed requisition to pathology and exact sciences

## 2021-07-20 ENCOUNTER — Ambulatory Visit: Payer: Self-pay | Admitting: Genetic Counselor

## 2021-07-20 DIAGNOSIS — C50411 Malignant neoplasm of upper-outer quadrant of right female breast: Secondary | ICD-10-CM

## 2021-07-20 DIAGNOSIS — Z170 Estrogen receptor positive status [ER+]: Secondary | ICD-10-CM

## 2021-07-20 NOTE — Progress Notes (Signed)
HPI:   Tiffany Walsh was previously seen in the [facility] clinic due to a personal and family history of cancer and concerns regarding a hereditary predisposition to cancer. Please refer to our prior cancer genetics clinic note for more information regarding our discussion, assessment and recommendations, at the time. Tiffany Walsh recent genetic test results were disclosed to her, as were recommendations warranted by these results. These results and recommendations are discussed in more detail below.  CANCER HISTORY:  Oncology History  Malignant neoplasm of upper-outer quadrant of right breast in female, estrogen receptor positive ([place])  06/15/2021 Initial Diagnosis   Malignant neoplasm of upper-outer quadrant of right breast in female, estrogen receptor positive ([place])   06/20/2021 Cancer Staging   Staging form: Breast, AJCC 8th Edition - Clinical stage from 06/20/2021: Stage IA (cT1b, cN0, cM0, G1, ER+, PR+, HER2-) - Signed by Chauncey Cruel, MD on 06/20/2021 Stage prefix: Initial diagnosis Histologic grading system: 3 grade system Laterality: Right Staged by: Pathologist and managing physician Stage used in treatment planning: Yes National guidelines used in treatment planning: Yes Type of national guideline used in treatment planning: NCCN    07/09/2021 Genetic Testing   [facility] CustomNext Panel was negative. No pathogenic variants were identified in the 47 genes analyzed. A variant of uncertain significance was detected in the MSH6 gene. Report date is 07/09/2021.  The CustomNext-Cancer+RNAinsight panel offered by [facility] includes sequencing and rearrangement analysis for the following 47 genes:  APC, ATM, AXIN2, BARD1, BMPR1A, BRCA1, BRCA2, BRIP1, CDH1, CDK4, CDKN2A, CHEK2, DICER1, EPCAM, GREM1, HOXB13, MEN1, MLH1, MSH2, MSH3, MSH6, MUTYH, NBN, NF1, NF2, NTHL1, PALB2, PMS2, POLD1, POLE, PTEN, RAD51C, RAD51D, RECQL, RET, SDHA, SDHAF2, SDHB, SDHC, SDHD, SMAD4,  SMARCA4, STK11, TP53, TSC1, TSC2, and VHL.  RNA data is routinely analyzed for use in variant interpretation for all genes.     FAMILY HISTORY:  We obtained a detailed, 4-generation family history.  Significant diagnoses are listed below:        Family History  Problem Relation Age of Onset   Cancer Maternal Aunt          unknown type   Breast cancer Maternal Aunt     Colon cancer Maternal Uncle     Lung cancer Maternal Uncle     Cancer Maternal Grandmother          maybe stomach?      Her maternal uncle was diagnosed with colon cancer at an unknown age, he is deceased. Another maternal uncle was diagnosed with lung cancer at an unknown age, he smoked, he is deceased. Her maternal aunt was diagnosed with breast cancer (>22 years of age). A second maternal aunt was diagnosed with an unknown type of cancer. Her maternal grandmother was diagnosed with an unknown type of cancer (>26 years of age), she is deceased. Of note, she does not have any information about her paternal family history.    Tiffany Walsh is unaware of previous family history of genetic testing for hereditary cancer risks. There no reported Ashkenazi Jewish ancestry.   GENETIC TEST RESULTS:  The [facility] CustomNext Panel found no pathogenic mutations.   The CustomNext-Cancer+RNAinsight panel offered by [facility] includes sequencing and rearrangement analysis for the following 47 genes:  APC, ATM, AXIN2, BARD1, BMPR1A, BRCA1, BRCA2, BRIP1, CDH1, CDK4, CDKN2A, CHEK2, DICER1, EPCAM, GREM1, HOXB13, MEN1, MLH1, MSH2, MSH3, MSH6, MUTYH, NBN, NF1, NF2, NTHL1, PALB2, PMS2, POLD1, POLE, PTEN, RAD51C, RAD51D, RECQL, RET, SDHA, SDHAF2, SDHB, SDHC, SDHD, SMAD4, SMARCA4,  STK11, TP53, TSC1, TSC2, and VHL.  RNA data is routinely analyzed for use in variant interpretation for all genes.  The test report has been scanned into EPIC and is located under the Molecular Pathology section of the Results Review tab.  A portion of the result report  is included below for reference. Genetic testing reported out on 07/09/2021.       Genetic testing identified a variant of uncertain significance (VUS) in the MSH6 gene called p.T1100M.  At this time, it is unknown if this variant is associated with an increased risk for cancer or if it is benign, but most uncertain variants are reclassified to benign. It should not be used to make medical management decisions. With time, we suspect the laboratory will determine the significance of this variant, if any. If the laboratory reclassifies this variant, we will attempt to contact Tiffany Walsh to discuss it further.   Even though a pathogenic variant was not identified, possible explanations for the cancer in the family may include: There may be no hereditary risk for cancer in the family. The cancers in Tiffany Walsh and/or her family may be due to other genetic or environmental factors. There may be a gene mutation in one of these genes that current testing methods cannot detect, but that chance is small. There could be another gene that has not yet been discovered, or that we have not yet tested, that is responsible for the cancer diagnoses in the family.  It is also possible there is a hereditary cause for the cancer in the family that Tiffany Walsh did not inherit. The variant of uncertain significance detected in the MSH6 gene may be reclassified as a pathogenic variant in the future. At this time, we do not know if this variant increases the risk for cancer.  Therefore, it is important to remain in touch with cancer genetics in the future so that we can continue to offer Tiffany Walsh the most up to date genetic testing.   ADDITIONAL GENETIC TESTING:  We discussed with Tiffany Walsh that her genetic testing was fairly extensive. If there are genes identified to increase cancer risk that can be analyzed in the future, we would be happy to discuss and coordinate this testing at that time.    CANCER  SCREENING RECOMMENDATIONS:  Tiffany Walsh test result is considered negative (normal). This means that we have not identified a hereditary cause for her personal and family history of cancer at this time.   An individual's cancer risk and medical management are not determined by genetic test results alone. Overall cancer risk assessment incorporates additional factors, including personal medical history, family history, and any available genetic information that may result in a personalized plan for cancer prevention and surveillance. Therefore, it is recommended she continue to follow the cancer management and screening guidelines provided by her oncology and primary healthcare provider.  RECOMMENDATIONS FOR FAMILY MEMBERS:   Since she did not inherit a mutation in a cancer predisposition gene included on this panel, her children could not have inherited a mutation from her in one of these genes. Individuals in this family might be at some increased risk of developing cancer, over the general population risk, due to the family history of cancer. We recommend women in this family have a yearly mammogram beginning at age 32, or 30 years younger than the earliest onset of cancer, an annual clinical breast exam, and perform monthly breast self-exams.  Other members of the family may still carry  a pathogenic variant in one of these genes that Tiffany Walsh did not inherit. Based on the family history, we recommend individuals with a personal history of cancer consider genetic counseling.    We do not recommend familial testing for the MSH6 variant of uncertain significance (VUS).  FOLLOW-UP:  Cancer genetics is a rapidly advancing field and it is possible that new genetic tests will be appropriate for her and/or her family members in the future. We encouraged her to remain in contact with cancer genetics on an annual basis so we can update her personal and family histories and let her know of advances in  cancer genetics that may benefit this family.   Our contact number was provided. Tiffany Walsh questions were answered to her satisfaction, and she knows she is welcome to call us at anytime with additional questions or concerns.   Lucille Passy, MS, [facility] Genetic Counselor [email or web address] (P) [phone]

## 2021-07-25 DIAGNOSIS — Z170 Estrogen receptor positive status [ER+]: Secondary | ICD-10-CM | POA: Diagnosis not present

## 2021-07-25 DIAGNOSIS — C50411 Malignant neoplasm of upper-outer quadrant of right female breast: Secondary | ICD-10-CM | POA: Diagnosis not present

## 2021-07-26 ENCOUNTER — Encounter (HOSPITAL_COMMUNITY): Payer: Self-pay

## 2021-07-27 ENCOUNTER — Encounter: Payer: Self-pay | Admitting: *Deleted

## 2021-07-27 ENCOUNTER — Telehealth: Payer: Self-pay | Admitting: *Deleted

## 2021-07-27 DIAGNOSIS — C50411 Malignant neoplasm of upper-outer quadrant of right female breast: Secondary | ICD-10-CM

## 2021-07-27 NOTE — Telephone Encounter (Signed)
Received oncoytpe results of 16/4%. Patient is aware.  Referral placed for Dr.Squire.

## 2021-08-01 ENCOUNTER — Encounter: Payer: Self-pay | Admitting: *Deleted

## 2021-08-06 ENCOUNTER — Other Ambulatory Visit: Payer: Self-pay

## 2021-08-06 ENCOUNTER — Ambulatory Visit: Payer: Medicare Other | Attending: Surgery | Admitting: Physical Therapy

## 2021-08-06 DIAGNOSIS — R293 Abnormal posture: Secondary | ICD-10-CM | POA: Diagnosis not present

## 2021-08-06 DIAGNOSIS — Z170 Estrogen receptor positive status [ER+]: Secondary | ICD-10-CM | POA: Insufficient documentation

## 2021-08-06 DIAGNOSIS — C50411 Malignant neoplasm of upper-outer quadrant of right female breast: Secondary | ICD-10-CM | POA: Insufficient documentation

## 2021-08-06 DIAGNOSIS — Z483 Aftercare following surgery for neoplasm: Secondary | ICD-10-CM | POA: Insufficient documentation

## 2021-08-06 DIAGNOSIS — M25611 Stiffness of right shoulder, not elsewhere classified: Secondary | ICD-10-CM | POA: Diagnosis not present

## 2021-08-06 DIAGNOSIS — C50911 Malignant neoplasm of unspecified site of right female breast: Secondary | ICD-10-CM | POA: Diagnosis not present

## 2021-08-06 NOTE — Patient Instructions (Addendum)
     [facility]  [address]  [place] [address]  [phone]  After Breast Cancer Class It is recommended you attend the ABC class to be educated on lymphedema risk reduction. This class is free of charge and lasts for 1 hour. It is a 1-time class. You are scheduled for December 5th at 11:00. We will send you a link. You need to download the Webex app.  Scar massage You can begin gentle scar massage on your incision a few minutes each day stretching the skin and then massage coconut oil in there very gently. Use your whole hand!  Compression garment You would benefit from a compression bra (I gave you info on where to get it today).  Home exercise Program You need to keep doing the stretches until you regain range of motion.  Follow up PT: It is recommended you return every 3 months for the first 3 years following surgery to be assessed on the SOZO machine for an L-Dex score. This helps prevent clinically significant lymphedema in 95% of patients. These follow up screens are 10 minute appointments that you are not billed for. You are scheduled for January 30th at 11:00.  Axillary web syndrome (also called cording) can happen after having breast cancer surgery when lymph nodes in the armpit are removed. It presents as if you have a thin cord in your arm and can run from the armpit all the way down into the forearm. If you've had a sentinel node biopsy, the risk is 1-20% and if you've had an axillary lymph node dissection (more than 7 nodes removed), the risk is 36-72%. The ranges vary depending on the research study.  It most often happens 3-4 weeks post-op but can happen sooner or later. There are several possibilities for what cording actually is. Although no one knows for sure as of yet, it may be related to lymphatics, veins, or other tissue. Sometimes cording resolves on its own but other times it requires physical therapy with a therapist who  specializes in lymphedema and/or cancer rehab. Treatment typically involves stretching, manual techniques, and exercise. Sometimes cords get "released" while stretching or during manual treatment and the patient may experience the sensation of a "pop." This may feel strange but it is not dangerous and is a sign that the cord has released; range of motion may be improved in the process.

## 2021-08-06 NOTE — Therapy (Signed)
[facility] [place], [place], [address] Phone: [phone]   Fax:  [phone]  Physical Therapy Treatment  Patient Details  Name: Tiffany Walsh MRN: [id number] Date of Birth: [birth year] Referring Provider (PT): Dr. Nedra Hai   Encounter Date: 08/06/2021   PT End of Session - 08/06/21 1404     Visit Number 2    Number of Visits 10    Date for PT Re-Evaluation 09/03/21    PT Start Time 1300    PT Stop Time 1350    PT Time Calculation (min) 50 min    Activity Tolerance Patient tolerated treatment well    Behavior During Therapy [facility] for tasks assessed/performed             Past Medical History:  Diagnosis Date   Abdominal pain, LLQ    Anemia    Arthritis of right knee    Cervical dysplasia    CIN I (cervical intraepithelial neoplasia I)    CIN II (cervical intraepithelial neoplasia II)    Colon cancer (HCC)    Epigastric pain    GERD (gastroesophageal reflux disease)    Heme positive stool    History of rectal cancer    Hx SBO    Hypercholesterolemia    Hypertension    Malignant neoplasm of rectum, rectosigmoid junction, and anus    Nausea with vomiting, unspecified    Peripheral neuropathy due to chemotherapy ([place])    Rectal cancer ([place])    stage 3   Vaginal delivery [id number]    Past Surgical History:  Procedure Laterality Date   BREAST LUMPECTOMY WITH RADIOACTIVE SEED AND SENTINEL LYMPH NODE BIOPSY Right 07/12/2021   Procedure: RIGHT BREAST LUMPECTOMY WITH RADIOACTIVE SEED AND SENTINEL LYMPH NODE BIOPSY;  Surgeon: Coralie Keens, MD;  Location: [facility];  Service: General;  Laterality: Right;   CERVICAL CONIZATION W/BX N/A 04/04/2015   Procedure: CONIZATION CERVIX WITH BIOPSY;  Surgeon: Thurnell Lose, MD;  Location: [facility];  Service: Gynecology;  Laterality: N/A;   COLON SURGERY     MOUTH SURGERY     widom teeth       There were no vitals filed for this  visit.   Subjective Assessment - 08/06/21 1255     Subjective Patient reports she underwent a right lumpectomy and sentinel node biopsy (5 negative nodes) on 07/12/2021. Her Oncotype score was low so she wil proceed to radiation and anti-estrogen therapy.    Pertinent History Patient was diagnosed on 05/23/2021 with right grade I invasive ductal carcinoma breast cancer.She underwent a right lumpectomy and sentinel node biopsy (5 negative nodes) on 07/12/2021. It is ER/PR positive and HER2 negative with a Ki67 of 10%.    Patient Stated Goals See if my arm is back to baseline    Currently in Pain? Yes    Pain Score 5     Pain Location Arm    Pain Orientation Right    Pain Descriptors / Indicators Other (Comment)   needles   Pain Type Surgical pain    Pain Onset 1 to 4 weeks ago    Pain Frequency Intermittent    Aggravating Factors  Reaching up    Pain Relieving Factors Not reaching                [facility] PT Assessment - 08/06/21 0001       Assessment   Medical Diagnosis s/p right lumpectomy and SLNB  Referring Provider (PT) Dr. Nedra Hai    Onset Date/Surgical Date 07/12/21    Hand Dominance Right    Prior Therapy Baselines      Precautions   Precautions Other (comment)    Precaution Comments recent surgery and lymphedema risk      Restrictions   Weight Bearing Restrictions No      Balance Screen   Has the patient fallen in the past 6 months No    Has the patient had a decrease in activity level because of a fear of falling?  No    Is the patient reluctant to leave their home because of a fear of falling?  No      Home Ecologist residence    Living Arrangements Spouse/significant other    Available Help at Discharge Family      Prior Function   Level of Independence Independent    Vocation Part time employment    Vocation Requirements She has returned to work    Leisure She reports she is walking 30 min 3x/week      Cognition    Overall Cognitive Status Within Functional Limits for tasks assessed      Observation/Other Assessments   Observations Significant seroma present in right upper outer breast with tenderness. She also c/o pain and tenderness in axilla although no visible cording noted. She is wearing a regular (no compression) bra and will benefit from a compression bra.  Incision appears to be healing well. Positive right upper limb tension test.      Posture/Postural Control   Posture/Postural Control Postural limitations    Postural Limitations Rounded Shoulders;Forward head      ROM / Strength   AROM / PROM / Strength AROM      AROM   AROM Assessment Site Shoulder    Right/Left Shoulder Right    Right Shoulder Extension 60 Degrees    Right Shoulder Flexion 117 Degrees    Right Shoulder ABduction 98 Degrees    Right Shoulder Internal Rotation 66 Degrees    Right Shoulder External Rotation 90 Degrees               LYMPHEDEMA/ONCOLOGY QUESTIONNAIRE - 08/06/21 0001       Type   Cancer Type Right breast cancer      Surgeries   Lumpectomy Date 07/12/21    Sentinel Lymph Node Biopsy Date 07/12/21    Number Lymph Nodes Removed 5      Treatment   Active Chemotherapy Treatment No    Past Chemotherapy Treatment No    Active Radiation Treatment No    Past Radiation Treatment No    Current Hormone Treatment No    Past Hormone Therapy No      What other symptoms do you have   Are you Having Heaviness or Tightness Yes    Are you having Pain Yes    Are you having pitting edema No    Is it Hard or Difficult finding clothes that fit No    Do you have infections No    Is there Decreased scar mobility Yes    [place]mer Sign No      Lymphedema Assessments   Lymphedema Assessments Upper extremities      Right Upper Extremity Lymphedema   10 cm Proximal to Olecranon Process 23.8 cm    Olecranon Process 22.5 cm    10 cm Proximal to Ulnar Styloid Process 18.3 cm    Just Proximal to  Ulnar  Styloid Process 14.6 cm    Across Hand at [employer] 18 cm    At [place] of 2nd Digit 5.7 cm      Left Upper Extremity Lymphedema   10 cm Proximal to Olecranon Process 23.1 cm    Olecranon Process 21.7 cm    10 cm Proximal to Ulnar Styloid Process 17.3 cm    Just Proximal to Ulnar Styloid Process 14.3 cm    Across Hand at [employer] 17.8 cm    At [place] of 2nd Digit 5.8 cm                Quick Dash - 08/06/21 0001     Open a tight or new jar Mild difficulty    Do heavy household chores (wash walls, wash floors) Mild difficulty    Carry a shopping bag or briefcase No difficulty    Wash your back Mild difficulty    Use a knife to cut food No difficulty    Recreational activities in which you take some force or impact through your arm, shoulder, or hand (golf, hammering, tennis) Mild difficulty    During the past week, to what extent has your arm, shoulder or hand problem interfered with your normal social activities with family, friends, neighbors, or groups? Modererately    During the past week, to what extent has your arm, shoulder or hand problem limited your work or other regular daily activities Modererately    Arm, shoulder, or hand pain. Moderate    Tingling (pins and needles) in your arm, shoulder, or hand Moderate    Difficulty Sleeping No difficulty    DASH Score 27.27 %                             PT Education - 08/06/21 1401     Education Details Lymphedema education and post op HEP; aftercare; scar tissue massage    Person(s) Educated Patient    Methods Explanation;Demonstration;Handout    Comprehension Returned demonstration;Verbalized understanding                 PT Long Term Goals - 08/06/21 1426       PT LONG TERM GOAL #1   Title Patient will demonstrate she has regained full shoulder ROM and function post operatively compared to baselines.    Time 4    Period Weeks    Status On-going    Target Date 09/03/21       PT LONG TERM GOAL #2   Title Patient will increase right shoulder active flexion to >/= 150 degrees for increased ease reaching.    Baseline 117 post op; 151 pre-op    Time 4    Period Weeks    Status New    Target Date 09/03/21      PT LONG TERM GOAL #3   Title Patient will increase right shoulder active abduction to >/= 160 degrees for increased ease obtaining radiation positioning.    Baseline 98 post op; 161 pre-op    Time 4    Period Weeks    Status New    Target Date 09/03/21      PT LONG TERM GOAL #4   Title Patient will improve her DASH score to be back to zero for improved overall arm function.    Baseline 27.27 post op; 0 pre-op    Time 4    Period Weeks  Status New    Target Date 09/03/21      PT LONG TERM GOAL #5   Title Patient will verbalize good understanding of lymphedema risk reduction practices.    Time 4    Period Weeks    Status New    Target Date 09/03/21                   Plan - 08/06/21 1404     Clinical Impression Statement Patient is doing well s/p right lumpectomy and sentinel node biopsy (5 negative nodes) on 07/12/2021. She developed a large seroma in her right axillary region which she reports was drained last week by he surgeon and she is scheduled for another appointment with him on 08/10/2021. Although not visible, she appears to have some axillary cording symptoms and neural tension limiting her right shoulder ROM and function. She will benefit form a compression bra to help with her swelling. She will benefit rom PT to regain shoulder ROM and function.    PT Frequency 2x / week    PT Duration 4 weeks    PT Treatment/Interventions ADLs/Self Care Home Management;Therapeutic exercise;Patient/family education;Passive range of motion;Manual techniques;Manual lymph drainage;Therapeutic activities;Scar mobilization    PT Next Visit Plan PROM, mofascial release for cording; assess Hugger Prima if she purchased and address seroma if needed.     PT Home Exercise Plan Post op shoulder ROM HEP    Consulted and Agree with Plan of Care Patient             Patient will benefit from skilled therapeutic intervention in order to improve the following deficits and impairments:  Postural dysfunction, Decreased range of motion, Decreased knowledge of precautions, Impaired UE functional use, Pain, Increased fascial restricitons, Increased edema, Decreased scar mobility  Visit Diagnosis: Malignant neoplasm of upper-outer quadrant of right breast in female, estrogen receptor positive ([place]) - Plan: PT plan of care cert/re-cert  Abnormal posture - Plan: PT plan of care cert/re-cert  Stiffness of right shoulder, not elsewhere classified - Plan: PT plan of care cert/re-cert  Aftercare following surgery for neoplasm - Plan: PT plan of care cert/re-cert     Problem List Patient Active Problem List   Diagnosis Date Noted   Genetic testing 06/27/2021   Family history of breast cancer 06/20/2021   Family history of colon cancer 06/20/2021   Malignant neoplasm of upper-outer quadrant of right breast in female, estrogen receptor positive ([place]) 06/15/2021   Hypokalemia    SBO (small bowel obstruction) ([place]) 05/25/2019   Essential hypertension 05/25/2019   Annia Friendly, PT 08/06/21 2:37 PM   [facility] @ [place], [place], [address] Phone: [phone]   Fax:  [phone]  Name: BILLIEJO SORTO MRN: [id number] Date of Birth: June 15, 1956

## 2021-08-10 NOTE — Progress Notes (Signed)
Location of Breast Cancer:  Malignant neoplasm of upper-outer quadrant of right breast in female, estrogen receptor positive   Histology per Pathology Report:  07/12/2021 FINAL MICROSCOPIC DIAGNOSIS:  A. BREAST, RIGHT, LUMPECTOMY:  - Invasive and in situ ductal carcinoma, 1.5 cm.  - All surgical margins negative for carcinoma.  - Biopsy site and biopsy clip.  - See oncology table.  B. LYMPH NODE, RIGHT AXILLARY, SENTINEL, EXCISION:  - One lymph node negative for metastatic carcinoma (0/1).  C. LYMPH NODE, RIGHT AXILLARY, SENTINEL, EXCISION:  - One lymph node negative for metastatic carcinoma (0/1).  D. LYMPH NODE, RIGHT AXILLARY, SENTINEL, EXCISION:  - One lymph node negative for metastatic carcinoma (0/1).  E. LYMPH NODE, RIGHT AXILLARY, SENTINEL, EXCISION:  - One lymph node negative for metastatic carcinoma (0/1).  F. LYMPH NODE, RIGHT AXILLARY, SENTINEL, EXCISION:  - One lymph node negative for metastatic carcinoma (0/1).   Receptor Status: ER(95%), PR (95%), Her2-neu (Negative via IHC), Ki-67(10%)  Did patient present with symptoms (if so, please note symptoms) or was this found on screening mammography?: Patient has a routine screening mammography on 05/23/2021 showing a possible abnormality in the right breast. She underwent right diagnostic mammography with tomography and right breast ultrason[facility] at [facility] on 05/31/2021 showing: breast density category B; irregular 1 cm right breast mass at 10 o'clock  Past/Anticipated interventions by surgeon, if any:  07/12/2021 --Dr. Coralie Keens RIGHT BREAST LUMPECTOMY WITH RADIOACTIVE SEED  DEEP RIGHT AXILLARY SENTINEL LYMPH NODE BIOPSY  Past/Anticipated interventions by medical oncology, if any:  Under care of Dr. Sarajane Jews Magrinat 06/20/2021 genetics testing  07/11/2021 [facility]--Genetic Counselor): "I contacted Ms. Fennema to discuss her genetic testing results. No pathogenic variants were identified in the 47 genes  analyzed. Of note, a variant of uncertain significance was identified in the MSH6 gene." definitive surgery pending Oncotype to be obtained from the definitive surgical sample, chemotherapy not anticipated 07/27/2021 [facility]--Breast Navigator): "Received oncoytpe results of 16/4%. Patient is aware." adjuvant radiation antiestrogens  Under the care of Dr. Alen Blew. History of stage pT3, pN1 rectal adenocarcinoma, diagnosed in 11/2007.  She underwent resection, concurrent chemoradiation with Xeloda, and adjuvant oxaliplatin and Xeloda concluded in 08/2008.   Lymphedema issues, if any:  Reports mild swelling to the right breast (states Dr. Ninfa Linden had to drain a seroma twice). Scheduled for PT assessment/evaluation tomorrow   Pain issues, if any:  Patient denies    SAFETY ISSUES: Prior radiation? Yes--for colorectal cancer Pacemaker/ICD? No Possible current pregnancy? No--postmenopausal Is the patient on methotrexate? No  Current Complaints / other details: Does reports some difficulty with range of motion (lifting her arm above her head); Otherwise nothing else of note

## 2021-08-12 NOTE — Progress Notes (Signed)
Radiation Oncology         [phone] ________________________________  Name: Tiffany Walsh MRN: [id number]  Date: 08/13/2021  DOB: 03-03-1956  Follow-Up Visit Note  Outpatient  CC: Alroy Dust, L.Marlou Sa, MD  Magrinat, Virgie Dad, MD  Diagnosis:      ICD-10-CM   1. Malignant neoplasm of upper-outer quadrant of right breast in female, estrogen receptor positive ([place])  C50.411    Z17.0        Cancer Staging  Malignant neoplasm of upper-outer quadrant of right breast in female, estrogen receptor positive ([place]) Staging form: Breast, AJCC 8th Edition - Clinical stage from 06/20/2021: Stage IA (cT1b, cN0, cM0, G1, ER+, PR+, HER2-) - Signed by Chauncey Cruel, MD on 06/20/2021 Stage prefix: Initial diagnosis Histologic grading system: 3 grade system Laterality: Right Staged by: Pathologist and managing physician Stage used in treatment planning: Yes National guidelines used in treatment planning: Yes Type of national guideline used in treatment planning: NCCN  Stage IA (cT1b, cN0, cM0) Right Breast UOQ, Invasive and in-situ Ducal Carcinoma, ER+ / PR+ / Her2-, Grade 1  pT1c,pN0  CHIEF COMPLAINT: Here to discuss management of right breast cancer  Narrative:  The patient returns today for follow-up.     Since breast clinic consultation date of 06/20/21, she underwent genetic testing on 09/28. Results were negative from BRCAplus testing, showing no clinically significant variants detected. However, a variant of unknown significance (p.T1100M) was detected from +RNAinsight testing in the MSH6 gene.   The patient opted to proceed with right breast lumpectomy and nodal biopsies on the date of 07/12/21 under the care of Dr. Ninfa Linden. Pathology from the procedure revealed: tumor size of 1.5 cm; histology of invasive and in-situ ductal carcinoma; margin status to invasive disease of all surgical margins negative for carcinoma; nodal status of 5/5 right axillary sentinel lymph node excisions  negative for carcinoma;  ER status: 95% positive; PR status 95% positive, both with strong staining intensity, Her2 status negative with IHC; Proliferation marker Ki67 10%; Grade 1.  Oncotype DX was obtained on the final surgical sample and the recurrence score of 16 predicts a risk of recurrence outside the breast over the next 9 years of 4%, if the patient's only systemic therapy is an antiestrogen for 5 years.  It also predicts no significant benefit from chemotherapy.  The patient recently followed up with Dr. Ninfa Linden on 07/31/21. During which time, the patient was noted to have developed a seroma, extending from her right axilla to the breast. Accordingly, Dr. Ninfa Linden aspirated 60 cc of serosanguineous fluid from the seroma. Otherwise, the patient was noted to be doing quite well from a postoperative standpoint.   Symptomatically, the patient reports: She is recovering well from surgery and pleased with her cosmetic outcome.  She reports some pain in her upper arm and some difficulty with range of motion.  She is seeing physical therapy.  And she is wearing a compression bra.        ALLERGIES:  has No Known Allergies.  Meds: Current Outpatient Medications  Medication Sig Dispense Refill   acetaminophen (TYLENOL) 325 MG tablet Take 650 mg by mouth every 6 (six) hours as needed for mild pain or headache.     aspirin EC 81 MG tablet Take 1 tablet (81 mg total) by mouth every other day. Swallow whole. 90 tablet 3   cholecalciferol (VITAMIN D3) 25 MCG (1000 UNIT) tablet Take 1,000 Units by mouth daily.     esomeprazole (NEXIUM) 40 MG capsule  Take 40 mg by mouth daily at 12 noon.     famotidine (PEPCID) 20 MG tablet Take 20 mg by mouth 2 (two) times daily.     ferrous sulfate 325 (65 FE) MG EC tablet Take 1 tablet (325 mg total) by mouth every Monday, Wednesday, and Friday. Take one tablet twice a day. 30 tablet 0   metoprolol succinate (TOPROL-XL) 100 MG 24 hr tablet Take 100 mg by mouth daily.  Take with or immediately following a meal.     rosuvastatin (CRESTOR) 20 MG tablet TAKE 1 TABLET BY MOUTH ONCE DAILY . APPOINTMENT REQUIRED FOR FUTURE REFILLS 90 tablet 3   traMADol (ULTRAM) 50 MG tablet Take 1 tablet (50 mg total) by mouth every 6 (six) hours as needed for moderate pain or severe pain. 25 tablet 0   verapamil (CALAN-SR) 240 MG CR tablet Take 360 mg by mouth daily.     No current facility-administered medications for this encounter.    Physical Findings:  weight is 124 lb 4 oz (56.4 kg). Her oral temperature is 97.8 F (36.6 C). Her blood pressure is 135/85 and her pulse is 45 (abnormal). Her respiration is 17. .     General: Alert and oriented, in no acute distress Psychiatric: Judgment and insight are intact. Affect is appropriate. Breast exam reveals satisfactory healing of right axillary /lumpectomy scar.  No excessive swelling of right breast postoperatively  Lab Findings: Lab Results  Component Value Date   WBC 4.0 06/20/2021   HGB 10.9 (L) 06/20/2021   HCT 33.5 (L) 06/20/2021   MCV 88.6 06/20/2021   PLT 149 (L) 06/20/2021     CMP     Component Value Date/Time   NA 144 06/20/2021 0831   NA 144 05/26/2020 0939   NA 144 05/09/2015 0923   K 4.2 06/20/2021 0831   K 3.9 05/09/2015 0923   CL 109 06/20/2021 0831   CL 101 10/27/2012 0913   CO2 25 06/20/2021 0831   CO2 26 05/09/2015 0923   GLUCOSE 94 06/20/2021 0831   GLUCOSE 92 05/09/2015 0923   GLUCOSE 83 10/27/2012 0913   BUN 13 06/20/2021 0831   BUN 11 05/26/2020 0939   BUN 12.6 05/09/2015 0923   CREATININE 0.80 06/20/2021 0831   CREATININE 0.8 05/09/2015 0923   CALCIUM 9.1 06/20/2021 0831   CALCIUM 8.5 05/09/2015 0923   PROT 7.2 06/20/2021 0831   PROT 6.9 05/09/2015 0923   ALBUMIN 4.0 06/20/2021 0831   ALBUMIN 3.7 05/09/2015 0923   AST 19 06/20/2021 0831   AST 13 05/09/2015 0923   ALT 15 06/20/2021 0831   ALT 10 05/09/2015 0923   ALKPHOS 96 06/20/2021 0831   ALKPHOS 76 05/09/2015 0923    BILITOT 0.4 06/20/2021 0831   BILITOT 0.27 05/09/2015 0923   GFRNONAA >60 06/20/2021 0831   GFRAA >60 06/06/2020 2141     Radiographic Findings: No results found.  Impression/Plan: We discussed adjuvant radiotherapy today.  I recommend 16 treatments to the right breast in order to reduce risk of local regional recurrence by two thirds.  Given her negative nodes , widely clear margins, and favorable histology, I do not think a boost is necessary.  I discussed that a boost could be given to render a total of 20 treatments but this would be associated with a higher risk of fibrosis in the lumpectomy cavity with marginal benefits, if any, to her prognosis.  She is enthusiastic about a 16 treatment regimen.  I reviewed the logistics,  benefits, risks, and potential side effects of this treatment in detail. Risks may include but not necessary be limited to acute and late injury tissue in the radiation fields such as skin irritation (change in color/pigmentation, itching, dryness, pain, peeling). She may experience fatigue. We also discussed possible risk of long term cosmetic changes or scar tissue. There is also a smaller risk for lung toxicity,  lymphedema, musculoskeletal changes, rib fragility or induction of a second malignancy, late chronic non-healing soft tissue wound.    The patient asked good questions which I answered to her satisfaction. She is enthusiastic about proceeding with treatment. A consent form has been signed and placed in her chart.  We will proceed with treatment planning today and start treatment next week .    On date of service, in total, I spent 30 minutes on this encounter. Patient was seen in person.  _____________________________________   Eppie Gibson, MD  This document serves as a record of services personally performed by Eppie Gibson, MD. It was created on her behalf by Roney Mans, a trained medical scribe. The creation of this record is based on the scribe's  personal observations and the provider's statements to them. This document has been checked and approved by the attending provider.

## 2021-08-13 ENCOUNTER — Other Ambulatory Visit: Payer: Self-pay

## 2021-08-13 ENCOUNTER — Encounter: Payer: Self-pay | Admitting: Radiation Oncology

## 2021-08-13 ENCOUNTER — Ambulatory Visit
Admission: RE | Admit: 2021-08-13 | Discharge: 2021-08-13 | Disposition: A | Discharge status: Home / Self Care | Payer: Medicare Other | Source: Ambulatory Visit | Attending: Radiation Oncology | Admitting: Radiation Oncology

## 2021-08-13 VITALS — BP 135/85 | HR 45 | Temp 97.8°F | Resp 17 | Wt 124.2 lb

## 2021-08-13 DIAGNOSIS — Z7982 Long term (current) use of aspirin: Secondary | ICD-10-CM | POA: Insufficient documentation

## 2021-08-13 DIAGNOSIS — C50411 Malignant neoplasm of upper-outer quadrant of right female breast: Secondary | ICD-10-CM | POA: Insufficient documentation

## 2021-08-13 DIAGNOSIS — Z170 Estrogen receptor positive status [ER+]: Secondary | ICD-10-CM

## 2021-08-13 DIAGNOSIS — Z79899 Other long term (current) drug therapy: Secondary | ICD-10-CM | POA: Insufficient documentation

## 2021-08-13 DIAGNOSIS — M79629 Pain in unspecified upper arm: Secondary | ICD-10-CM | POA: Insufficient documentation

## 2021-08-13 DIAGNOSIS — Z510 Encounter for antineoplastic radiation therapy: Secondary | ICD-10-CM | POA: Insufficient documentation

## 2021-08-14 ENCOUNTER — Ambulatory Visit: Payer: Medicare Other | Admitting: Physical Therapy

## 2021-08-14 ENCOUNTER — Encounter: Payer: Self-pay | Admitting: Physical Therapy

## 2021-08-14 DIAGNOSIS — Z510 Encounter for antineoplastic radiation therapy: Secondary | ICD-10-CM | POA: Diagnosis not present

## 2021-08-14 DIAGNOSIS — R293 Abnormal posture: Secondary | ICD-10-CM

## 2021-08-14 DIAGNOSIS — Z170 Estrogen receptor positive status [ER+]: Secondary | ICD-10-CM | POA: Diagnosis not present

## 2021-08-14 DIAGNOSIS — M25611 Stiffness of right shoulder, not elsewhere classified: Secondary | ICD-10-CM | POA: Diagnosis not present

## 2021-08-14 DIAGNOSIS — Z483 Aftercare following surgery for neoplasm: Secondary | ICD-10-CM | POA: Diagnosis not present

## 2021-08-14 DIAGNOSIS — C50411 Malignant neoplasm of upper-outer quadrant of right female breast: Secondary | ICD-10-CM | POA: Diagnosis not present

## 2021-08-14 NOTE — Patient Instructions (Signed)
Shoulder: Abduction (Supine)    With right arm flat on floor, hold dowel in palm. Slowly move arm up to side of head by pushing with opposite arm. Do not let elbow bend. Hold _5-30___ seconds. Repeat _10___ times. Do __2__ sessions per day. CAUTION: Stretch slowly and gently.  Copyright  VHI. All rights reserved.  Shoulder: Flexion (Supine)    With hands shoulder width apart, slowly lower dowel to floor behind head. Do not let elbows bend. Keep back flat. Hold _5-30___ seconds. Repeat __10__ times. Do _2___ sessions per day. CAUTION: Stretch slowly and gently.  Copyright  VHI. All rights reserved.

## 2021-08-14 NOTE — Therapy (Signed)
[facility] @ [facility] [place], [place], [address] Phone: [phone]   Fax:  [phone]  Physical Therapy Treatment  Patient Details  Name: Tiffany Walsh MRN: [id number] Date of Birth: January 01, 1956 Referring Provider (PT): Dr. Nedra Hai   Encounter Date: 08/14/2021   PT End of Session - 08/14/21 1052     Visit Number 3    Number of Visits 10    Date for PT Re-Evaluation 09/03/21    PT Start Time 1000    PT Stop Time 1049    PT Time Calculation (min) 49 min    Activity Tolerance Patient tolerated treatment well    Behavior During Therapy [facility] for tasks assessed/performed             Past Medical History:  Diagnosis Date   Abdominal pain, LLQ    Anemia    Arthritis of right knee    Cervical dysplasia    CIN I (cervical intraepithelial neoplasia I)    CIN II (cervical intraepithelial neoplasia II)    Colon cancer (HCC)    Epigastric pain    GERD (gastroesophageal reflux disease)    Heme positive stool    History of rectal cancer    Hx SBO    Hypercholesterolemia    Hypertension    Malignant neoplasm of rectum, rectosigmoid junction, and anus    Nausea with vomiting, unspecified    Peripheral neuropathy due to chemotherapy ([place])    Rectal cancer ([place])    stage 3   Vaginal delivery [id number]    Past Surgical History:  Procedure Laterality Date   BREAST LUMPECTOMY WITH RADIOACTIVE SEED AND SENTINEL LYMPH NODE BIOPSY Right 07/12/2021   Procedure: RIGHT BREAST LUMPECTOMY WITH RADIOACTIVE SEED AND SENTINEL LYMPH NODE BIOPSY;  Surgeon: Coralie Keens, MD;  Location: [facility];  Service: General;  Laterality: Right;   CERVICAL CONIZATION W/BX N/A 04/04/2015   Procedure: CONIZATION CERVIX WITH BIOPSY;  Surgeon: Thurnell Lose, MD;  Location: [facility];  Service: Gynecology;  Laterality: N/A;   COLON SURGERY     MOUTH SURGERY     widom teeth       There were no vitals filed for this  visit.   Subjective Assessment - 08/14/21 1001     Subjective Dr. Ninfa Linden drained by breast on Friday. I have been wearing the compression bra.    Pertinent History Patient was diagnosed on 05/23/2021 with right grade I invasive ductal carcinoma breast cancer.She underwent a right lumpectomy and sentinel node biopsy (5 negative nodes) on 07/12/2021. It is ER/PR positive and HER2 negative with a Ki67 of 10%.    Patient Stated Goals See if my arm is back to baseline    Currently in Pain? Yes    Pain Score 6     Pain Location Arm    Pain Orientation Right    Pain Descriptors / Indicators Sharp    Pain Type Surgical pain    Pain Onset 1 to 4 weeks ago    Pain Frequency Intermittent    Aggravating Factors  reaching up    Pain Relieving Factors not reaching                               [facility] Adult PT Treatment/Exercise - 08/14/21 0001       Shoulder Exercises: Supine   Flexion AAROM;Both;10 reps   with 5 sec holds and v/c  to keep elbows straight   ABduction AAROM;Right;10 reps   with 5 sec holds, v/c to keep elbow straight     Manual Therapy   Manual Therapy Neural Stretch;Myofascial release;Passive ROM    Myofascial Release to cording palpable in R axilla - at least 4 cords palpable, also along R upper arm and antecubital fossa    Passive ROM to R shoulder in to flexion and abduction with prolonged holds while performing MFR to R axilla and upper arm    Neural Stretch median nerve stretch to R UE                          PT Long Term Goals - 08/06/21 1426       PT LONG TERM GOAL #1   Title Patient will demonstrate she has regained full shoulder ROM and function post operatively compared to baselines.    Time 4    Period Weeks    Status On-going    Target Date 09/03/21      PT LONG TERM GOAL #2   Title Patient will increase right shoulder active flexion to >/= 150 degrees for increased ease reaching.    Baseline 117 post op; 151 pre-op     Time 4    Period Weeks    Status New    Target Date 09/03/21      PT LONG TERM GOAL #3   Title Patient will increase right shoulder active abduction to >/= 160 degrees for increased ease obtaining radiation positioning.    Baseline 98 post op; 161 pre-op    Time 4    Period Weeks    Status New    Target Date 09/03/21      PT LONG TERM GOAL #4   Title Patient will improve her DASH score to be back to zero for improved overall arm function.    Baseline 27.27 post op; 0 pre-op    Time 4    Period Weeks    Status New    Target Date 09/03/21      PT LONG TERM GOAL #5   Title Patient will verbalize good understanding of lymphedema risk reduction practices.    Time 4    Period Weeks    Status New    Target Date 09/03/21                   Plan - 08/14/21 1052     Clinical Impression Statement Began PROM to R shoulder in to flexion and abduction whilie focusing on MFR to cording in R axilla, upper arm and antecubital fossa. Pt very tight and tender at beginning of session but by end of session pt had decreased discomfort and improved motion with less pulling and stabbing pain. Pt reports it felt easier to get dressed at end of session. At least 4 cords palpable at beginning of session in R axilla that were more difficult to palpate by end of session. Instructed pt in supine AAROM exercises with dowel to stretch cords at home.    PT Frequency 2x / week    PT Duration 4 weeks    PT Treatment/Interventions ADLs/Self Care Home Management;Therapeutic exercise;Patient/family education;Passive range of motion;Manual techniques;Manual lymph drainage;Therapeutic activities;Scar mobilization    PT Next Visit Plan PROM, mofascial release for cording; assess Hugger Prima if she purchased and address seroma if needed.    PT Home Exercise Plan Post op shoulder ROM HEP    Consulted  and Agree with Plan of Care Patient             Patient will benefit from skilled therapeutic  intervention in order to improve the following deficits and impairments:  Postural dysfunction, Decreased range of motion, Decreased knowledge of precautions, Impaired UE functional use, Pain, Increased fascial restricitons, Increased edema, Decreased scar mobility  Visit Diagnosis: Stiffness of right shoulder, not elsewhere classified  Aftercare following surgery for neoplasm  Abnormal posture  Malignant neoplasm of upper-outer quadrant of right breast in female, estrogen receptor positive [facility])     Problem List Patient Active Problem List   Diagnosis Date Noted   Genetic testing 06/27/2021   Family history of breast cancer 06/20/2021   Family history of colon cancer 06/20/2021   Malignant neoplasm of upper-outer quadrant of right breast in female, estrogen receptor positive ([place]) 06/15/2021   Hypokalemia    SBO (small bowel obstruction) ([place]) 05/25/2019   Essential hypertension 05/25/2019    Manus Gunning, PT 08/14/2021, 10:55 AM  [facility] @ [facility] [place], [place], [address] Phone: [phone]   Fax:  [phone]  Name: SHRADDHA LEBRON MRN: [id number] Date of Birth: Jul 10, 1956  Manus Gunning, PT 08/14/21 10:55 AM

## 2021-08-19 NOTE — Progress Notes (Signed)
[facility]  [phone] [phone]     ID: [name] DOB: Oct 30, 1955  MR#: [id number]  GUR#:[id number]  Patient Care Team: Alroy Dust, Carlean Jews.Marlou Sa, MD as PCP - General (Family Medicine) Mauro Kaufmann, RN as Oncology Nurse Navigator Rockwell Germany, RN as Oncology Nurse Navigator Coralie Keens, MD as Consulting Physician (General Surgery) Azeneth Carbonell, Virgie Dad, MD as Consulting Physician (Oncology) Eppie Gibson, MD as Attending Physician (Radiation Oncology) Jerline Pain, MD as Consulting Physician (Cardiology) Chauncey Cruel, MD OTHER MD:  CHIEF COMPLAINT: Estrogen receptor positive breast cancer  CURRENT TREATMENT: adjuvant radiation therapy pending   INTERVAL HISTORY: Tiffany Walsh returns today for follow up of her estrogen receptor positive breast cancer. She was evaluated in the multidisciplinary breast cancer clinic on 06/20/2021.  She had genetic testing done at clinic. Results were negative, with a variant of uncertain significance in MSH6.  She proceeded to right lumpectomy on 07/12/2021 under Dr. Ninfa Linden. Pathology from the procedure ([id number]) showed: invasive and in situ ductal carcinoma, 1.5 cm; all surgical margins negative.  All five biopsied lymph nodes were negative for carcinoma (0/5).  Oncotype DX was obtained on the final surgical sample and the recurrence score of 16 predicts a risk of recurrence outside the breast over the next 9 years of 4%, if the patient's only systemic therapy is an antiestrogen for 5 years.  It also predicts no benefit from chemotherapy.   She was referred back to Dr. Isidore Moos on 08/13/2021 to review adjuvant radiation therapy. She is scheduled to receive treatment starting 08/22/2021 through 09/12/2021.   REVIEW OF SYSTEMS: Tiffany Walsh did very well with her surgery, with no significant pain bleeding fever or other complications.  She is very satisfied with the cosmetic result.  She has had a seroma  which has been drained twice she says.  She is also having some cording and she is having physical therapy for that.  She has not had significant swelling issues otherwise.  Detailed review of systems was otherwise stable.   COVID 19 VACCINATION STATUS: [place] x2   HISTORY OF CURRENT ILLNESS: From the original intake note:  Tiffany Walsh has a history of stage pT3, pN1 rectal adenocarcinoma, diagnosed in 11/2007. She underwent resection, concurrent chemoradiation with Xeloda, and adjuvant oxaliplatin and Xeloda concluded in 08/2008. She was under the care of Dr. Alen Blew.  She had routine screening mammography on 05/23/2021 showing a possible abnormality in the right breast. She underwent right diagnostic mammography with tomography and right breast ultrasonography at [facility] on 05/31/2021 showing: breast density category B; irregular 1 cm right breast mass at 10 o'clock.  Accordingly on 06/11/2021 she proceeded to biopsy of the right breast area in question. The pathology from this procedure ([id number]) showed: invasive mammary carcinoma, e-cadherin positive, grade 1; focal in situ carcinoma. Prognostic indicators significant for: estrogen receptor, 95% positive and progesterone receptor, 95% positive, both with strong staining intensity. Proliferation marker Ki67 at 10%. HER2 negative by immunohistochemistry (1+).   Cancer Staging  Malignant neoplasm of upper-outer quadrant of right breast in female, estrogen receptor positive ([place]) Staging form: Breast, AJCC 8th Edition - Clinical stage from 06/20/2021: Stage IA (cT1b, cN0, cM0, G1, ER+, PR+, HER2-) - Signed by Chauncey Cruel, MD on 06/20/2021 Stage prefix: Initial diagnosis Histologic grading system: 3 grade system Laterality: Right Staged by: Pathologist and managing physician Stage used in treatment planning: Yes National guidelines used in treatment planning: Yes Type of national guideline used in treatment planning: NCCN  The  patient's subsequent history is as detailed below.   PAST MEDICAL HISTORY: Past Medical History:  Diagnosis Date   Abdominal pain, LLQ    Anemia    Arthritis of right knee    Cervical dysplasia    CIN I (cervical intraepithelial neoplasia I)    CIN II (cervical intraepithelial neoplasia II)    Colon cancer (HCC)    Epigastric pain    GERD (gastroesophageal reflux disease)    Heme positive stool    History of rectal cancer    Hx SBO    Hypercholesterolemia    Hypertension    Malignant neoplasm of rectum, rectosigmoid junction, and anus    Nausea with vomiting, unspecified    Peripheral neuropathy due to chemotherapy (HCC)    Rectal cancer (HCC)    stage 3   Vaginal delivery 2694,8546    PAST SURGICAL HISTORY: Past Surgical History:  Procedure Laterality Date   BREAST LUMPECTOMY WITH RADIOACTIVE SEED AND SENTINEL LYMPH NODE BIOPSY Right 07/12/2021   Procedure: RIGHT BREAST LUMPECTOMY WITH RADIOACTIVE SEED AND SENTINEL LYMPH NODE BIOPSY;  Surgeon: Coralie Keens, MD;  Location: [facility];  Service: General;  Laterality: Right;   CERVICAL CONIZATION W/BX N/A 04/04/2015   Procedure: CONIZATION CERVIX WITH BIOPSY;  Surgeon: Thurnell Lose, MD;  Location: [facility];  Service: Gynecology;  Laterality: N/A;   COLON SURGERY     MOUTH SURGERY     widom teeth       FAMILY HISTORY: Family History  Problem Relation Age of Onset   Cancer Maternal Aunt        unknown type   Breast cancer Maternal Aunt    Colon cancer Maternal Uncle    Lung cancer Maternal Uncle    Cancer Maternal Grandmother        maybe stomach?   She has no information on her father or his side of the family. Her mother is currently 51 years old as of 05/2021. Tiffany Walsh has no siblings. On her mother's side of the family, she reports lung cancer in an uncle, colon cancer in another uncle, breast cancer in an aunt, cancer (stomach?) in her grandmother, and cancer in another aunt.   GYNECOLOGIC  HISTORY:  No LMP recorded. Patient is postmenopausal. Menarche: 65 years old Age at first live birth: 65 years old [place] P 2 LMP 2004 Contraceptive: used from 1976-1980 HRT never used  Hysterectomy? no BSO? 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 Husband [name] is a retired Glass blower/designer. She lives at home with [name]. Son [name], age 59, is in the [employer] and stationed at [employer]. Daughter [name], age 26, is a CNA in [employer]. Yulissa has 5 grandchildren. She attends a [employer], therapeutic church.    ADVANCED DIRECTIVES: In the absence of any documentation to the contrary, the patient's spouse is their HCPOA.    HEALTH MAINTENANCE: Social History   Tobacco Use   Smoking status: Former    Types: Cigarettes   Smokeless tobacco: Never  Vaping Use   Vaping Use: Never used  Substance Use Topics   Alcohol use: Yes    Comment: occasional    Drug use: No     Colonoscopy: 2017 ([facility])  PAP: 2020  Bone density: date unsure   No Known Allergies  Current Outpatient Medications  Medication Sig Dispense Refill   acetaminophen (TYLENOL) 325 MG tablet Take 650 mg by mouth every 6 (six) hours as needed  for mild pain or headache.     aspirin EC 81 MG tablet Take 1 tablet (81 mg total) by mouth every other day. Swallow whole. 90 tablet 3   cholecalciferol (VITAMIN D3) 25 MCG (1000 UNIT) tablet Take 1,000 Units by mouth daily.     esomeprazole (NEXIUM) 40 MG capsule Take 40 mg by mouth daily at 12 noon.     famotidine (PEPCID) 20 MG tablet Take 20 mg by mouth 2 (two) times daily.     ferrous sulfate 325 (65 FE) MG EC tablet Take 1 tablet (325 mg total) by mouth every Monday, Wednesday, and Friday. Take one tablet twice a day. 30 tablet 0   metoprolol succinate (TOPROL-XL) 100 MG 24 hr tablet Take 100 mg by mouth daily. Take with or immediately following a meal.     rosuvastatin (CRESTOR) 20 MG tablet TAKE 1 TABLET BY MOUTH ONCE DAILY  . APPOINTMENT REQUIRED FOR FUTURE REFILLS 90 tablet 3   verapamil (CALAN-SR) 240 MG CR tablet Take 360 mg by mouth daily.     No current facility-administered medications for this visit.    OBJECTIVE: White Walsh who appears younger than stated age  21:   08/20/21 1544  BP: 122/68  Pulse: 63  Resp: 16  Temp: 97.6 F (36.4 C)  SpO2: 95%      Body mass index is 20.92 kg/m.   Wt Readings from Last 3 Encounters:  08/20/21 125 lb 11.2 oz (57 kg)  08/13/21 124 lb 4 oz (56.4 kg)  07/12/21 124 lb 12.5 oz (56.6 kg)     ECOG FS:1 - Symptomatic but completely ambulatory  Sclerae unicteric, EOMs intact Wearing a mask No cervical or supraclavicular adenopathy Lungs no rales or rhonchi Heart regular rate and rhythm Abd soft, nontender, positive bowel sounds MSK no focal spinal tenderness, no upper extremity lymphedema Neuro: nonfocal, well oriented, appropriate affect Breasts: The right breast is status post recent lumpectomy.  The incision is at the axilla so the cosmetic result is excellent.  There is no erythema dehiscence or swelling except for the seroma in the right axilla which is palpable.  The left breast on left axilla are benign.   LAB RESULTS:  CMP     Component Value Date/Time   NA 144 06/20/2021 0831   NA 144 05/26/2020 0939   NA 144 05/09/2015 0923   K 4.2 06/20/2021 0831   K 3.9 05/09/2015 0923   CL 109 06/20/2021 0831   CL 101 10/27/2012 0913   CO2 25 06/20/2021 0831   CO2 26 05/09/2015 0923   GLUCOSE 94 06/20/2021 0831   GLUCOSE 92 05/09/2015 0923   GLUCOSE 83 10/27/2012 0913   BUN 13 06/20/2021 0831   BUN 11 05/26/2020 0939   BUN 12.6 05/09/2015 0923   CREATININE 0.80 06/20/2021 0831   CREATININE 0.8 05/09/2015 0923   CALCIUM 9.1 06/20/2021 0831   CALCIUM 8.5 05/09/2015 0923   PROT 7.2 06/20/2021 0831   PROT 6.9 05/09/2015 0923   ALBUMIN 4.0 06/20/2021 0831   ALBUMIN 3.7 05/09/2015 0923   AST 19 06/20/2021 0831   AST 13 05/09/2015 0923   ALT  15 06/20/2021 0831   ALT 10 05/09/2015 0923   ALKPHOS 96 06/20/2021 0831   ALKPHOS 76 05/09/2015 0923   BILITOT 0.4 06/20/2021 0831   BILITOT 0.27 05/09/2015 0923   GFRNONAA >60 06/20/2021 0831   GFRAA >60 06/06/2020 2141    No results found for: TOTALPROTELP, ALBUMINELP, A1GS, A2GS, BETS, BETA2SER, GAMS,  MSPIKE, SPEI  Lab Results  Component Value Date   WBC 4.0 06/20/2021   NEUTROABS 2.3 06/20/2021   HGB 10.9 (L) 06/20/2021   HCT 33.5 (L) 06/20/2021   MCV 88.6 06/20/2021   PLT 149 (L) 06/20/2021    No results found for: LABCA2  No components found for: FKCLEX517  No results for input(s): INR in the last 168 hours.  No results found for: LABCA2  No results found for: GYF749  No results found for: SWH675  No results found for: FFM384  No results found for: CA2729  No components found for: HGQUANT  No results found for: CEA1 / No results found for: CEA1   No results found for: AFPTUMOR  No results found for: CHROMOGRNA  No results found for: KPAFRELGTCHN, LAMBDASER, KAPLAMBRATIO (kappa/lambda light chains)  No results found for: HGBA, HGBA2QUANT, HGBFQUANT, HGBSQUAN (Hemoglobinopathy evaluation)   No results found for: LDH  No results found for: IRON, TIBC, IRONPCTSAT (Iron and TIBC)  No results found for: FERRITIN  Urinalysis    Component Value Date/Time   COLORURINE YELLOW 06/06/2020 2141   APPEARANCEUR HAZY (A) 06/06/2020 2141   LABSPEC 1.029 06/06/2020 2141   PHURINE 5.0 06/06/2020 2141   GLUCOSEU NEGATIVE 06/06/2020 2141   HGBUR MODERATE (A) 06/06/2020 2141   BILIRUBINUR NEGATIVE 06/06/2020 2141   KETONESUR 80 (A) 06/06/2020 2141   PROTEINUR 30 (A) 06/06/2020 2141   NITRITE NEGATIVE 06/06/2020 2141   LEUKOCYTESUR SMALL (A) 06/06/2020 2141    STUDIES: No results found.   ELIGIBLE FOR AVAILABLE RESEARCH PROTOCOL: no  ASSESSMENT: 65 y.o. Tiffany Walsh status post right breast upper outer quadrant biopsy 06/11/2021 for a clinical T1b  N0, stage IA invasive ductal carcinoma, grade 1, estrogen and progesterone receptor positive, with an MIB-1 of 10% and HER2 not amplified.  (1) genetics testing 07/09/2021 through the [facility] Panel found no deleterious mutations in APC, ATM, AXIN2, BARD1, BMPR1A, BRCA1, BRCA2, BRIP1, CDH1, CDK4, CDKN2A, CHEK2, DICER1, EPCAM, GREM1, HOXB13, MEN1, MLH1, MSH2, MSH3, MSH6, MUTYH, NBN, NF1, NF2, NTHL1, PALB2, PMS2, POLD1, POLE, PTEN, RAD51C, RAD51D, RECQL, RET, SDHA, SDHAF2, SDHB, SDHC, SDHD, SMAD4, SMARCA4, STK11, TP53, TSC1, TSC2, and VHL.  RNA data is routinely analyzed for use in variant interpretation for all genes.  (a) A variant of uncertain significance was detected in the MSH6 gene.  (2) right lumpectomy and sentinel lymph node sampling 07/12/2021 showed a pT1c pN0, stage IA invasive ductal carcinoma, grade 1, with negative margins.  (a) a total of 5 right axillary lymph nodes were removed  (3) Oncotype score of 16 predicts a risk of recurrence outside the breast within the next 9 years of 4% if the patient's only systemic therapy is antiestrogens for 5 years.  It also predicts no significant benefit from chemotherapy.  (4) adjuvant radiation pending  (5) antiestrogens to start at the completion of local treatment   PLAN: I gave Tiffany Walsh a copy of her surgical pathology and reviewed the results with her.  I then went over her Oncotype results in detail so she understands without chemotherapy but taking an antiestrogen pill for 5 years she will have a 96% chance of her breast cancer not recurring outside the breast in the next 9 years.  She was understandably pleased with this prognosis.  She still has radiation in front of her.  We then discussed antiestrogens and specifically the difference between anastrozole and tamoxifen. She understands that anastrozole and the aromatase inhibitors in general work by blocking estrogen production. Accordingly  vaginal dryness, decrease in bone  density, and of course hot flashes can result. The aromatase inhibitors can also negatively affect the cholesterol profile, although that is a minor effect. One out of 5 women on aromatase inhibitors we will feel "old and achy". This arthralgia/myalgia syndrome, which resembles fibromyalgia clinically, does resolve with stopping the medications. Accordingly this is not a reason to not try an aromatase inhibitor but it is a frequent reason to stop it (in other words 20% of women will not be able to tolerate these medications).  Tamoxifen on the other hand does not block estrogen production. It does not "take away a Walsh's estrogen". It blocks the estrogen receptor in breast cells. Like anastrozole, it can also cause hot flashes. As opposed to anastrozole, tamoxifen has many estrogen-like effects. It is technically an estrogen receptor modulator. This means that in some tissues tamoxifen works like estrogen-- for example it helps strengthen the bones. It tends to improve the cholesterol profile. It can cause thickening of the endometrial lining, and even endometrial polyps or rarely cancer of the uterus.(The risk of uterine cancer due to tamoxifen is one additional cancer per thousand women year). It can cause vaginal wetness or stickiness. It can cause blood clots through this estrogen-like effect--the risk of blood clots with tamoxifen is exactly the same as with birth control pills or hormone replacement.  Neither of these agents causes mood changes or weight gain, despite the popular belief that they can have these side effects. We have data from studies comparing either of these drugs with placebo, and in those cases the control group had the same amount of weight gain and depression as the group that took the drug.  We decided she will start with anastrozole September 23, 2021 and then see [place] in March.  If she is tolerating it well the plan will be to continue it a minimum of 5 years.  If not we could switch  to tamoxifen at that point.  I placed the order through mail order company which usually is the least expensive and most convenient.  She will see [place] again in March.  She knows to call for any other issue that may develop before the next visit.  Total encounter time 25 minutes.Sarajane Jews C. Efton Thomley, MD 08/20/2021 5:28 PM Medical Oncology and Hematology [facility] [place], [place] [address] Tel. [phone]    Fax. [phone]   This document serves as a record of services personally performed by Lurline Del, MD. It was created on his behalf by Wilburn Mylar, a trained medical scribe. The creation of this record is based on the scribe's personal observations and the provider's statements to them.   I, Lurline Del MD, have reviewed the above documentation for accuracy and completeness, and I agree with the above.   *Total Encounter Time as defined by the Centers for Medicare and Medicaid Services includes, in addition to the face-to-face time of a patient visit (documented in the note above) non-face-to-face time: obtaining and reviewing outside history, ordering and reviewing medications, tests or procedures, care coordination (communications with other health care professionals or caregivers) and documentation in the medical record.

## 2021-08-20 ENCOUNTER — Other Ambulatory Visit: Payer: Self-pay

## 2021-08-20 ENCOUNTER — Ambulatory Visit: Payer: Medicare Other | Admitting: Radiation Oncology

## 2021-08-20 ENCOUNTER — Inpatient Hospital Stay: Payer: Medicare Other | Attending: Oncology | Admitting: Oncology

## 2021-08-20 VITALS — BP 122/68 | HR 63 | Temp 97.6°F | Resp 16 | Ht 65.0 in | Wt 125.7 lb

## 2021-08-20 DIAGNOSIS — C50411 Malignant neoplasm of upper-outer quadrant of right female breast: Secondary | ICD-10-CM | POA: Diagnosis not present

## 2021-08-20 DIAGNOSIS — Z170 Estrogen receptor positive status [ER+]: Secondary | ICD-10-CM | POA: Insufficient documentation

## 2021-08-20 MED ORDER — TAMOXIFEN CITRATE 20 MG PO TABS: 20.0000 mg | ORAL_TABLET | Freq: Every day | ORAL | 4 refills | Status: DC

## 2021-08-21 ENCOUNTER — Telehealth: Payer: Self-pay | Admitting: Oncology

## 2021-08-21 ENCOUNTER — Ambulatory Visit: Payer: Medicare Other

## 2021-08-21 ENCOUNTER — Encounter: Payer: Self-pay | Admitting: *Deleted

## 2021-08-21 NOTE — Telephone Encounter (Signed)
Scheduled appointment per 11/28 los. Patient is aware. 

## 2021-08-22 ENCOUNTER — Ambulatory Visit
Admission: RE | Admit: 2021-08-22 | Discharge: 2021-08-22 | Disposition: A | Discharge status: Home / Self Care | Payer: Medicare Other | Source: Ambulatory Visit | Attending: Radiation Oncology | Admitting: Radiation Oncology

## 2021-08-22 ENCOUNTER — Ambulatory Visit: Payer: Medicare Other

## 2021-08-22 ENCOUNTER — Other Ambulatory Visit: Payer: Self-pay

## 2021-08-22 DIAGNOSIS — C50411 Malignant neoplasm of upper-outer quadrant of right female breast: Secondary | ICD-10-CM | POA: Diagnosis not present

## 2021-08-22 DIAGNOSIS — Z510 Encounter for antineoplastic radiation therapy: Secondary | ICD-10-CM | POA: Diagnosis not present

## 2021-08-23 ENCOUNTER — Ambulatory Visit
Admission: RE | Admit: 2021-08-23 | Discharge: 2021-08-23 | Disposition: A | Discharge status: Home / Self Care | Payer: Medicare Other | Source: Ambulatory Visit | Attending: Radiation Oncology | Admitting: Radiation Oncology

## 2021-08-23 DIAGNOSIS — Z170 Estrogen receptor positive status [ER+]: Secondary | ICD-10-CM | POA: Insufficient documentation

## 2021-08-23 DIAGNOSIS — C50411 Malignant neoplasm of upper-outer quadrant of right female breast: Secondary | ICD-10-CM | POA: Insufficient documentation

## 2021-08-24 ENCOUNTER — Other Ambulatory Visit: Payer: Self-pay

## 2021-08-24 ENCOUNTER — Ambulatory Visit
Admission: RE | Admit: 2021-08-24 | Discharge: 2021-08-24 | Disposition: A | Discharge status: Home / Self Care | Payer: Medicare Other | Source: Ambulatory Visit | Attending: Radiation Oncology | Admitting: Radiation Oncology

## 2021-08-24 DIAGNOSIS — Z170 Estrogen receptor positive status [ER+]: Secondary | ICD-10-CM | POA: Diagnosis not present

## 2021-08-24 DIAGNOSIS — C50411 Malignant neoplasm of upper-outer quadrant of right female breast: Secondary | ICD-10-CM | POA: Diagnosis not present

## 2021-08-27 ENCOUNTER — Encounter: Payer: Self-pay | Admitting: Physical Therapy

## 2021-08-27 ENCOUNTER — Other Ambulatory Visit: Payer: Self-pay

## 2021-08-27 ENCOUNTER — Ambulatory Visit: Payer: Medicare Other | Attending: Surgery | Admitting: Physical Therapy

## 2021-08-27 ENCOUNTER — Ambulatory Visit
Admission: RE | Admit: 2021-08-27 | Discharge: 2021-08-27 | Disposition: A | Discharge status: Home / Self Care | Payer: Medicare Other | Source: Ambulatory Visit | Attending: Radiation Oncology | Admitting: Radiation Oncology

## 2021-08-27 DIAGNOSIS — R293 Abnormal posture: Secondary | ICD-10-CM | POA: Insufficient documentation

## 2021-08-27 DIAGNOSIS — C50411 Malignant neoplasm of upper-outer quadrant of right female breast: Secondary | ICD-10-CM

## 2021-08-27 DIAGNOSIS — Z170 Estrogen receptor positive status [ER+]: Secondary | ICD-10-CM | POA: Diagnosis not present

## 2021-08-27 DIAGNOSIS — M25611 Stiffness of right shoulder, not elsewhere classified: Secondary | ICD-10-CM | POA: Insufficient documentation

## 2021-08-27 DIAGNOSIS — Z483 Aftercare following surgery for neoplasm: Secondary | ICD-10-CM | POA: Insufficient documentation

## 2021-08-27 MED ORDER — RADIAPLEXRX EX GEL
Freq: Once | CUTANEOUS | Status: AC
Start: 2021-08-27 — End: 2021-08-27

## 2021-08-27 MED ORDER — ALRA NON-METALLIC DEODORANT (RAD-ONC)
1.0000 "application " | Freq: Once | TOPICAL | Status: AC
  Administered 2021-08-27: 1 via TOPICAL

## 2021-08-27 NOTE — Progress Notes (Signed)

## 2021-08-27 NOTE — Therapy (Signed)
[facility] [address] [place], [place], [address] Phone: [phone]   Fax:  [phone]  Physical Therapy Treatment  Patient Details  Name: Tiffany Walsh MRN: [id number] Date of Birth: 02/24/1956 Referring Provider (PT): Dr. Doug Blackman   Encounter Date: 08/27/2021   PT End of Session - 08/27/21 1449     Visit Number 4    Number of Visits 10    Date for PT Re-Evaluation 09/03/21    PT Start Time 1354    PT Stop Time 1447    PT Time Calculation (min) 53 min    Activity Tolerance Patient tolerated treatment well    Behavior During Therapy WFL for tasks assessed/performed             Past Medical History:  Diagnosis Date   Abdominal pain, LLQ    Anemia    Arthritis of right knee    Cervical dysplasia    CIN I (cervical intraepithelial neoplasia I)    CIN II (cervical intraepithelial neoplasia II)    Colon cancer (HCC)    Epigastric pain    GERD (gastroesophageal reflux disease)    Heme positive stool    History of rectal cancer    Hx SBO    Hypercholesterolemia    Hypertension    Malignant neoplasm of rectum, rectosigmoid junction, and anus    Nausea with vomiting, unspecified    Peripheral neuropathy due to chemotherapy (HCC)    Rectal cancer (HCC)    stage 3   Vaginal delivery 1973,1975    Past Surgical History:  Procedure Laterality Date   BREAST LUMPECTOMY WITH RADIOACTIVE SEED AND SENTINEL LYMPH NODE BIOPSY Right 07/12/2021   Procedure: RIGHT BREAST LUMPECTOMY WITH RADIOACTIVE SEED AND SENTINEL LYMPH NODE BIOPSY;  Surgeon: Blackman, Douglas, MD;  Location: [facility];  Service: General;  Laterality: Right;   CERVICAL CONIZATION W/BX N/A 04/04/2015   Procedure: CONIZATION CERVIX WITH BIOPSY;  Surgeon: Evelyn Varnado, MD;  Location: [facility];  Service: Gynecology;  Laterality: N/A;   COLON SURGERY     MOUTH SURGERY     widom teeth       There were no vitals filed for this  visit.   Subjective Assessment - 08/27/21 1355     Subjective The arm is doing some better. The exercises are helping. I feel less tight than I was.    Pertinent History Patient was diagnosed on 05/23/2021 with right grade I invasive ductal carcinoma breast cancer.She underwent a right lumpectomy and sentinel node biopsy (5 negative nodes) on 07/12/2021. It is ER/PR positive and HER2 negative with a Ki67 of 10%.    Patient Stated Goals See if my arm is back to baseline    Currently in Pain? No/denies    Pain Score 0-No pain                               [facility] Adult PT Treatment/Exercise - 08/27/21 0001       Manual Therapy   Manual Therapy Myofascial release;Passive ROM;Soft tissue mobilization    Soft tissue mobilization scar mobilization to R SLNB scar in area of seroma where there is a large area of scar tissue    Myofascial Release to cording palpable in R axilla - at least 2 cords palpable, also along R upper arm and antecubital fossa where there appears to be deep cording which is less palpable      Passive ROM to R shoulder in to flexion and abduction with prolonged holds while performing MFR to R axilla and upper arm                          PT Long Term Goals - 08/06/21 1426       PT LONG TERM GOAL #1   Title Patient will demonstrate she has regained full shoulder ROM and function post operatively compared to baselines.    Time 4    Period Weeks    Status On-going    Target Date 09/03/21      PT LONG TERM GOAL #2   Title Patient will increase right shoulder active flexion to >/= 150 degrees for increased ease reaching.    Baseline 117 post op; 151 pre-op    Time 4    Period Weeks    Status New    Target Date 09/03/21      PT LONG TERM GOAL #3   Title Patient will increase right shoulder active abduction to >/= 160 degrees for increased ease obtaining radiation positioning.    Baseline 98 post op; 161 pre-op    Time 4    Period  Weeks    Status New    Target Date 09/03/21      PT LONG TERM GOAL #4   Title Patient will improve her DASH score to be back to zero for improved overall arm function.    Baseline 27.27 post op; 0 pre-op    Time 4    Period Weeks    Status New    Target Date 09/03/21      PT LONG TERM GOAL #5   Title Patient will verbalize good understanding of lymphedema risk reduction practices.    Time 4    Period Weeks    Status New    Target Date 09/03/21                   Plan - 08/27/21 1450     Clinical Impression Statement Began scar moblization to R SLNB scar in area of seroma where there was a large area of scar tissue. Educated pt how to do scar tissue massage at home. Area softened some with scar mobilization today. Continued with myofascial release to cording in R axilla. Several cords still palpable at axilla and pt has deep cording at antecubital fossa but her ROM has improved greatly from last session and her cording was not as tender. By end of session cording was less palpable.    PT Frequency 2x / week    PT Duration 4 weeks    PT Treatment/Interventions ADLs/Self Care Home Management;Therapeutic exercise;Patient/family education;Passive range of motion;Manual techniques;Manual lymph drainage;Therapeutic activities;Scar mobilization    PT Next Visit Plan PROM, mofascial release for cording; add foam to bra in area of seroma, give supine scap    PT Home Exercise Plan Post op shoulder ROM HEP, scar massage    Consulted and Agree with Plan of Care Patient             Patient will benefit from skilled therapeutic intervention in order to improve the following deficits and impairments:  Postural dysfunction, Decreased range of motion, Decreased knowledge of precautions, Impaired UE functional use, Pain, Increased fascial restricitons, Increased edema, Decreased scar mobility  Visit Diagnosis: Stiffness of right shoulder, not elsewhere classified  Aftercare following  surgery for neoplasm  Abnormal posture  Malignant neoplasm of upper-outer quadrant  of right breast in female, estrogen receptor positive (HCC)     Problem List Patient Active Problem List   Diagnosis Date Noted   Genetic testing 06/27/2021   Family history of breast cancer 06/20/2021   Family history of colon cancer 06/20/2021   Malignant neoplasm of upper-outer quadrant of right breast in female, estrogen receptor positive (HCC) 06/15/2021   Hypokalemia    SBO (small bowel obstruction) (HCC) 05/25/2019   Essential hypertension 05/25/2019    Blaire Breedlove Blue, PT 08/27/2021, 2:54 PM  [facility] @ [facility] [address] [place], [place], [address] Phone: [phone]   Fax:  [phone]  Name: Tiffany Walsh MRN: [id number] Date of Birth: 12/02/1955   Blaire Breedlove Blue, PT 08/27/21 2:54 PM  

## 2021-08-28 ENCOUNTER — Ambulatory Visit
Admission: RE | Admit: 2021-08-28 | Discharge: 2021-08-28 | Disposition: A | Discharge status: Home / Self Care | Payer: Medicare Other | Source: Ambulatory Visit | Attending: Radiation Oncology | Admitting: Radiation Oncology

## 2021-08-28 DIAGNOSIS — C50411 Malignant neoplasm of upper-outer quadrant of right female breast: Secondary | ICD-10-CM | POA: Diagnosis not present

## 2021-08-28 DIAGNOSIS — Z170 Estrogen receptor positive status [ER+]: Secondary | ICD-10-CM | POA: Diagnosis not present

## 2021-08-29 ENCOUNTER — Ambulatory Visit: Payer: Medicare Other | Admitting: Physical Therapy

## 2021-08-29 ENCOUNTER — Ambulatory Visit
Admission: RE | Admit: 2021-08-29 | Discharge: 2021-08-29 | Disposition: A | Discharge status: Home / Self Care | Payer: Medicare Other | Source: Ambulatory Visit | Attending: Radiation Oncology | Admitting: Radiation Oncology

## 2021-08-29 ENCOUNTER — Encounter: Payer: Self-pay | Admitting: Physical Therapy

## 2021-08-29 ENCOUNTER — Other Ambulatory Visit: Payer: Self-pay

## 2021-08-29 DIAGNOSIS — M25611 Stiffness of right shoulder, not elsewhere classified: Secondary | ICD-10-CM | POA: Diagnosis not present

## 2021-08-29 DIAGNOSIS — C50411 Malignant neoplasm of upper-outer quadrant of right female breast: Secondary | ICD-10-CM | POA: Diagnosis not present

## 2021-08-29 DIAGNOSIS — Z170 Estrogen receptor positive status [ER+]: Secondary | ICD-10-CM | POA: Diagnosis not present

## 2021-08-29 DIAGNOSIS — Z483 Aftercare following surgery for neoplasm: Secondary | ICD-10-CM

## 2021-08-29 DIAGNOSIS — R293 Abnormal posture: Secondary | ICD-10-CM

## 2021-08-29 NOTE — Therapy (Signed)
[facility] @ [facility] [place], [place], [address] Phone: [phone]   Fax:  [phone]  Physical Therapy Treatment  Patient Details  Name: ALYZE LAUF MRN: [id number] Date of Birth: [birth year] Referring Provider (PT): Dr. Nedra Hai   Encounter Date: 08/29/2021   PT End of Session - 08/29/21 1459     Visit Number 5    Number of Visits 10    Date for PT Re-Evaluation 09/03/21    PT Start Time 1409    PT Stop Time 1456    PT Time Calculation (min) 47 min    Activity Tolerance Patient tolerated treatment well    Behavior During Therapy [facility] for tasks assessed/performed             Past Medical History:  Diagnosis Date   Abdominal pain, LLQ    Anemia    Arthritis of right knee    Cervical dysplasia    CIN I (cervical intraepithelial neoplasia I)    CIN II (cervical intraepithelial neoplasia II)    Colon cancer (HCC)    Epigastric pain    GERD (gastroesophageal reflux disease)    Heme positive stool    History of rectal cancer    Hx SBO    Hypercholesterolemia    Hypertension    Malignant neoplasm of rectum, rectosigmoid junction, and anus    Nausea with vomiting, unspecified    Peripheral neuropathy due to chemotherapy ([place])    Rectal cancer ([place])    stage 3   Vaginal delivery [id number],[id number]    Past Surgical History:  Procedure Laterality Date   BREAST LUMPECTOMY WITH RADIOACTIVE SEED AND SENTINEL LYMPH NODE BIOPSY Right 07/12/2021   Procedure: RIGHT BREAST LUMPECTOMY WITH RADIOACTIVE SEED AND SENTINEL LYMPH NODE BIOPSY;  Surgeon: Coralie Keens, MD;  Location: [facility];  Service: General;  Laterality: Right;   CERVICAL CONIZATION W/BX N/A 04/04/2015   Procedure: CONIZATION CERVIX WITH BIOPSY;  Surgeon: Thurnell Lose, MD;  Location: [facility];  Service: Gynecology;  Laterality: N/A;   COLON SURGERY     MOUTH SURGERY     widom teeth       There were no vitals filed for this  visit.   Subjective Assessment - 08/29/21 1409     Subjective I feel like I had a little swelling after the scar massage. I can tell the tightness is getting better.    Pertinent History Patient was diagnosed on 05/23/2021 with right grade I invasive ductal carcinoma breast cancer.She underwent a right lumpectomy and sentinel node biopsy (5 negative nodes) on 07/12/2021. It is ER/PR positive and HER2 negative with a Ki67 of 10%.    Patient Stated Goals See if my arm is back to baseline    Currently in Pain? No/denies    Pain Score 0-No pain                               [facility] Adult PT Treatment/Exercise - 08/29/21 0001       Manual Therapy   Manual Therapy Manual Lymphatic Drainage (MLD)    Soft tissue mobilization scar mobilization to R SLNB scar in area of seroma where there is a large area of scar tissue    Myofascial Release to cording palpable in R axilla    Manual Lymphatic Drainage (MLD) in supine; short neck, 5 diaphragmatic breaths, L axillary nodes and establishment of interaxillary pathway, R  inguinal nodes and establishment of axillo inguinal pathway, R breast and R anterior axilla moving fluid towards pathways                          PT Long Term Goals - 08/06/21 1426       PT LONG TERM GOAL #1   Title Patient will demonstrate she has regained full shoulder ROM and function post operatively compared to baselines.    Time 4    Period Weeks    Status On-going    Target Date 09/03/21      PT LONG TERM GOAL #2   Title Patient will increase right shoulder active flexion to >/= 150 degrees for increased ease reaching.    Baseline 117 post op; 151 pre-op    Time 4    Period Weeks    Status New    Target Date 09/03/21      PT LONG TERM GOAL #3   Title Patient will increase right shoulder active abduction to >/= 160 degrees for increased ease obtaining radiation positioning.    Baseline 98 post op; 161 pre-op    Time 4    Period  Weeks    Status New    Target Date 09/03/21      PT LONG TERM GOAL #4   Title Patient will improve her DASH score to be back to zero for improved overall arm function.    Baseline 27.27 post op; 0 pre-op    Time 4    Period Weeks    Status New    Target Date 09/03/21      PT LONG TERM GOAL #5   Title Patient will verbalize good understanding of lymphedema risk reduction practices.    Time 4    Period Weeks    Status New    Target Date 09/03/21                   Plan - 08/29/21 1459     Clinical Impression Statement Pt reports that she began developing some swelling in her anterior R axilla so began MLD today to this area to help decrease swelling. Pt also appeared to have some increased swelling in inferior breast so added breast MLD as well. Continued with scar mobilization to R SLNB scar which remains very fibrotic. Cording was slightly improved today but still palpable in her R axilla. Will instruct pt in self MLD at next session.    PT Frequency 2x / week    PT Duration 4 weeks    PT Treatment/Interventions ADLs/Self Care Home Management;Therapeutic exercise;Patient/family education;Passive range of motion;Manual techniques;Manual lymph drainage;Therapeutic activities;Scar mobilization    PT Next Visit Plan PROM, mofascial release for cording; add foam to bra in area of seroma, give supine scap, instruct in self MLD    PT Home Exercise Plan Post op shoulder ROM HEP, scar massage    Consulted and Agree with Plan of Care Patient             Patient will benefit from skilled therapeutic intervention in order to improve the following deficits and impairments:  Postural dysfunction, Decreased range of motion, Decreased knowledge of precautions, Impaired UE functional use, Pain, Increased fascial restricitons, Increased edema, Decreased scar mobility  Visit Diagnosis: Stiffness of right shoulder, not elsewhere classified  Aftercare following surgery for  neoplasm  Abnormal posture  Malignant neoplasm of upper-outer quadrant of right breast in female, estrogen receptor positive ([place])  Problem List Patient Active Problem List   Diagnosis Date Noted   Genetic testing 06/27/2021   Family history of breast cancer 06/20/2021   Family history of colon cancer 06/20/2021   Malignant neoplasm of upper-outer quadrant of right breast in female, estrogen receptor positive ([place]) 06/15/2021   Hypokalemia    SBO (small bowel obstruction) ([place]) 05/25/2019   Essential hypertension 05/25/2019    Manus Gunning, PT 08/29/2021, 3:03 PM  [facility] @ [place], [place], [address] Phone: [phone]   Fax:  [phone]  Name: MADDISYN HEGWOOD MRN: [id number] Date of Birth: 1956/07/07   Manus Gunning, PT 08/29/21 3:03 PM

## 2021-08-30 ENCOUNTER — Ambulatory Visit
Admission: RE | Admit: 2021-08-30 | Discharge: 2021-08-30 | Disposition: A | Discharge status: Home / Self Care | Payer: Medicare Other | Source: Ambulatory Visit | Attending: Radiation Oncology | Admitting: Radiation Oncology

## 2021-08-30 DIAGNOSIS — C50411 Malignant neoplasm of upper-outer quadrant of right female breast: Secondary | ICD-10-CM | POA: Diagnosis not present

## 2021-08-30 DIAGNOSIS — Z170 Estrogen receptor positive status [ER+]: Secondary | ICD-10-CM | POA: Diagnosis not present

## 2021-08-31 ENCOUNTER — Ambulatory Visit
Admission: RE | Admit: 2021-08-31 | Discharge: 2021-08-31 | Disposition: A | Discharge status: Home / Self Care | Payer: Medicare Other | Source: Ambulatory Visit | Attending: Radiation Oncology | Admitting: Radiation Oncology

## 2021-08-31 ENCOUNTER — Other Ambulatory Visit: Payer: Self-pay

## 2021-08-31 DIAGNOSIS — C50411 Malignant neoplasm of upper-outer quadrant of right female breast: Secondary | ICD-10-CM | POA: Diagnosis not present

## 2021-08-31 DIAGNOSIS — Z170 Estrogen receptor positive status [ER+]: Secondary | ICD-10-CM | POA: Diagnosis not present

## 2021-09-03 ENCOUNTER — Ambulatory Visit
Admission: RE | Admit: 2021-09-03 | Discharge: 2021-09-03 | Disposition: A | Discharge status: Home / Self Care | Payer: Medicare Other | Source: Ambulatory Visit | Attending: Radiation Oncology | Admitting: Radiation Oncology

## 2021-09-03 ENCOUNTER — Other Ambulatory Visit: Payer: Self-pay

## 2021-09-03 DIAGNOSIS — C50411 Malignant neoplasm of upper-outer quadrant of right female breast: Secondary | ICD-10-CM | POA: Diagnosis not present

## 2021-09-03 DIAGNOSIS — Z170 Estrogen receptor positive status [ER+]: Secondary | ICD-10-CM | POA: Diagnosis not present

## 2021-09-04 ENCOUNTER — Other Ambulatory Visit: Payer: Self-pay

## 2021-09-04 ENCOUNTER — Ambulatory Visit
Admission: RE | Admit: 2021-09-04 | Discharge: 2021-09-04 | Disposition: A | Discharge status: Home / Self Care | Payer: Medicare Other | Source: Ambulatory Visit | Attending: Radiation Oncology | Admitting: Radiation Oncology

## 2021-09-04 DIAGNOSIS — C50411 Malignant neoplasm of upper-outer quadrant of right female breast: Secondary | ICD-10-CM | POA: Diagnosis not present

## 2021-09-04 DIAGNOSIS — Z170 Estrogen receptor positive status [ER+]: Secondary | ICD-10-CM | POA: Diagnosis not present

## 2021-09-05 ENCOUNTER — Ambulatory Visit
Admission: RE | Admit: 2021-09-05 | Discharge: 2021-09-05 | Disposition: A | Discharge status: Home / Self Care | Payer: Medicare Other | Source: Ambulatory Visit | Attending: Radiation Oncology | Admitting: Radiation Oncology

## 2021-09-05 DIAGNOSIS — Z170 Estrogen receptor positive status [ER+]: Secondary | ICD-10-CM | POA: Diagnosis not present

## 2021-09-05 DIAGNOSIS — C50411 Malignant neoplasm of upper-outer quadrant of right female breast: Secondary | ICD-10-CM | POA: Diagnosis not present

## 2021-09-06 ENCOUNTER — Other Ambulatory Visit: Payer: Self-pay

## 2021-09-06 ENCOUNTER — Ambulatory Visit
Admission: RE | Admit: 2021-09-06 | Discharge: 2021-09-06 | Disposition: A | Discharge status: Home / Self Care | Payer: Medicare Other | Source: Ambulatory Visit | Attending: Radiation Oncology | Admitting: Radiation Oncology

## 2021-09-06 DIAGNOSIS — C50411 Malignant neoplasm of upper-outer quadrant of right female breast: Secondary | ICD-10-CM | POA: Diagnosis not present

## 2021-09-06 DIAGNOSIS — D128 Benign neoplasm of rectum: Secondary | ICD-10-CM | POA: Diagnosis not present

## 2021-09-06 DIAGNOSIS — Z85048 Personal history of other malignant neoplasm of rectum, rectosigmoid junction, and anus: Secondary | ICD-10-CM | POA: Diagnosis not present

## 2021-09-06 DIAGNOSIS — D125 Benign neoplasm of sigmoid colon: Secondary | ICD-10-CM | POA: Diagnosis not present

## 2021-09-06 DIAGNOSIS — Z980 Intestinal bypass and anastomosis status: Secondary | ICD-10-CM | POA: Diagnosis not present

## 2021-09-06 DIAGNOSIS — Z170 Estrogen receptor positive status [ER+]: Secondary | ICD-10-CM | POA: Diagnosis not present

## 2021-09-07 ENCOUNTER — Ambulatory Visit
Admission: RE | Admit: 2021-09-07 | Discharge: 2021-09-07 | Disposition: A | Discharge status: Home / Self Care | Payer: Medicare Other | Source: Ambulatory Visit | Attending: Radiation Oncology | Admitting: Radiation Oncology

## 2021-09-07 DIAGNOSIS — C50411 Malignant neoplasm of upper-outer quadrant of right female breast: Secondary | ICD-10-CM | POA: Diagnosis not present

## 2021-09-07 DIAGNOSIS — Z170 Estrogen receptor positive status [ER+]: Secondary | ICD-10-CM | POA: Diagnosis not present

## 2021-09-10 ENCOUNTER — Other Ambulatory Visit: Payer: Self-pay

## 2021-09-10 ENCOUNTER — Ambulatory Visit: Payer: Medicare Other

## 2021-09-10 ENCOUNTER — Ambulatory Visit
Admission: RE | Admit: 2021-09-10 | Discharge: 2021-09-10 | Disposition: A | Discharge status: Home / Self Care | Payer: Medicare Other | Source: Ambulatory Visit | Attending: Radiation Oncology | Admitting: Radiation Oncology

## 2021-09-10 DIAGNOSIS — D125 Benign neoplasm of sigmoid colon: Secondary | ICD-10-CM | POA: Diagnosis not present

## 2021-09-10 DIAGNOSIS — Z170 Estrogen receptor positive status [ER+]: Secondary | ICD-10-CM | POA: Diagnosis not present

## 2021-09-10 DIAGNOSIS — D128 Benign neoplasm of rectum: Secondary | ICD-10-CM | POA: Diagnosis not present

## 2021-09-10 DIAGNOSIS — C50411 Malignant neoplasm of upper-outer quadrant of right female breast: Secondary | ICD-10-CM | POA: Diagnosis not present

## 2021-09-10 MED ORDER — TAMOXIFEN CITRATE 20 MG PO TABS: 20.0000 mg | ORAL_TABLET | Freq: Every day | ORAL | 4 refills | Status: AC

## 2021-09-11 ENCOUNTER — Ambulatory Visit
Admission: RE | Admit: 2021-09-11 | Discharge: 2021-09-11 | Disposition: A | Discharge status: Home / Self Care | Payer: Medicare Other | Source: Ambulatory Visit | Attending: Radiation Oncology | Admitting: Radiation Oncology

## 2021-09-11 ENCOUNTER — Encounter: Payer: Self-pay | Admitting: *Deleted

## 2021-09-11 DIAGNOSIS — C50411 Malignant neoplasm of upper-outer quadrant of right female breast: Secondary | ICD-10-CM | POA: Diagnosis not present

## 2021-09-11 DIAGNOSIS — Z170 Estrogen receptor positive status [ER+]: Secondary | ICD-10-CM

## 2021-09-12 ENCOUNTER — Encounter: Payer: Self-pay | Admitting: Radiation Oncology

## 2021-09-12 ENCOUNTER — Other Ambulatory Visit: Payer: Self-pay | Admitting: Family Medicine

## 2021-09-12 ENCOUNTER — Ambulatory Visit
Admission: RE | Admit: 2021-09-12 | Discharge: 2021-09-12 | Disposition: A | Discharge status: Home / Self Care | Payer: Medicare Other | Source: Ambulatory Visit | Attending: Radiation Oncology | Admitting: Radiation Oncology

## 2021-09-12 ENCOUNTER — Other Ambulatory Visit: Payer: Self-pay

## 2021-09-12 DIAGNOSIS — C50411 Malignant neoplasm of upper-outer quadrant of right female breast: Secondary | ICD-10-CM | POA: Diagnosis not present

## 2021-09-12 DIAGNOSIS — R55 Syncope and collapse: Secondary | ICD-10-CM | POA: Diagnosis not present

## 2021-09-12 DIAGNOSIS — E559 Vitamin D deficiency, unspecified: Secondary | ICD-10-CM | POA: Diagnosis not present

## 2021-09-12 DIAGNOSIS — I10 Essential (primary) hypertension: Secondary | ICD-10-CM | POA: Diagnosis not present

## 2021-09-12 DIAGNOSIS — Z170 Estrogen receptor positive status [ER+]: Secondary | ICD-10-CM | POA: Diagnosis not present

## 2021-09-19 ENCOUNTER — Other Ambulatory Visit: Payer: Self-pay

## 2021-09-19 ENCOUNTER — Ambulatory Visit
Admission: RE | Admit: 2021-09-19 | Discharge: 2021-09-19 | Disposition: A | Discharge status: Home / Self Care | Payer: Medicare Other | Source: Ambulatory Visit | Attending: Family Medicine | Admitting: Family Medicine

## 2021-09-19 ENCOUNTER — Ambulatory Visit: Payer: Medicare Other

## 2021-09-19 DIAGNOSIS — R55 Syncope and collapse: Secondary | ICD-10-CM

## 2021-09-19 DIAGNOSIS — C50411 Malignant neoplasm of upper-outer quadrant of right female breast: Secondary | ICD-10-CM | POA: Diagnosis not present

## 2021-09-19 DIAGNOSIS — M25611 Stiffness of right shoulder, not elsewhere classified: Secondary | ICD-10-CM

## 2021-09-19 DIAGNOSIS — Z170 Estrogen receptor positive status [ER+]: Secondary | ICD-10-CM | POA: Diagnosis not present

## 2021-09-19 DIAGNOSIS — R293 Abnormal posture: Secondary | ICD-10-CM

## 2021-09-19 DIAGNOSIS — Z483 Aftercare following surgery for neoplasm: Secondary | ICD-10-CM | POA: Diagnosis not present

## 2021-09-19 DIAGNOSIS — I771 Stricture of artery: Secondary | ICD-10-CM | POA: Diagnosis not present

## 2021-09-19 DIAGNOSIS — I6523 Occlusion and stenosis of bilateral carotid arteries: Secondary | ICD-10-CM | POA: Diagnosis not present

## 2021-09-19 NOTE — Therapy (Addendum)
[facility] @ [place], [place], [address] Phone: [phone]   Fax:  [phone]  Physical Therapy Treatment  Patient Details  Name: SHACARRA CHOE MRN: [id number] Date of Birth: 06/12/1956 Referring Provider (PT): Dr. Nedra Hai   Encounter Date: 09/19/2021   PT End of Session - 09/19/21 1208     Visit Number 6    Number of Visits 10    Date for PT Re-Evaluation 10/17/21    PT Start Time 1108    PT Stop Time 1203    PT Time Calculation (min) 55 min    Activity Tolerance Patient tolerated treatment well    Behavior During Therapy [facility] for tasks assessed/performed             Past Medical History:  Diagnosis Date   Abdominal pain, LLQ    Anemia    Arthritis of right knee    Cervical dysplasia    CIN I (cervical intraepithelial neoplasia I)    CIN II (cervical intraepithelial neoplasia II)    Colon cancer (HCC)    Epigastric pain    GERD (gastroesophageal reflux disease)    Heme positive stool    History of rectal cancer    Hx SBO    Hypercholesterolemia    Hypertension    Malignant neoplasm of rectum, rectosigmoid junction, and anus    Nausea with vomiting, unspecified    Peripheral neuropathy due to chemotherapy ([place])    Rectal cancer ([place])    stage 3   Vaginal delivery 1700,1749    Past Surgical History:  Procedure Laterality Date   BREAST LUMPECTOMY WITH RADIOACTIVE SEED AND SENTINEL LYMPH NODE BIOPSY Right 07/12/2021   Procedure: RIGHT BREAST LUMPECTOMY WITH RADIOACTIVE SEED AND SENTINEL LYMPH NODE BIOPSY;  Surgeon: Coralie Keens, MD;  Location: [facility];  Service: General;  Laterality: Right;   CERVICAL CONIZATION W/BX N/A 04/04/2015   Procedure: CONIZATION CERVIX WITH BIOPSY;  Surgeon: Thurnell Lose, MD;  Location: [facility];  Service: Gynecology;  Laterality: N/A;   COLON SURGERY     MOUTH SURGERY     widom teeth       There were no vitals filed for this  visit.   Subjective Assessment - 09/19/21 1112     Subjective I fainted last Monday and landed on my arm. It was hurting more but it's been getting better. The doctor is running some tests to see what happened.    Pertinent History Patient was diagnosed on 05/23/2021 with right grade I invasive ductal carcinoma breast cancer.She underwent a right lumpectomy and sentinel node biopsy (5 negative nodes) on 07/12/2021. It is ER/PR positive and HER2 negative with a Ki67 of 10%.    Patient Stated Goals See if my arm is back to baseline    Currently in Pain? No/denies   just some tenderness in the Rt axilla                              [facility] Adult PT Treatment/Exercise - 09/19/21 0001       Manual Therapy   Soft tissue mobilization scar mobilization to R SLNB scar in area of seroma where there is a large area of scar tissue    Myofascial Release to cording palpable in R axilla    Manual Lymphatic Drainage (MLD) in supine; short neck, 5 diaphragmatic breaths, L axillary nodes and establishment of interaxillary pathway, R  inguinal nodes and establishment of axillo inguinal pathway, R breast and R anterior axilla moving fluid towards pathways but had to be mindful to avoid peeling skin from having just completed radiation last week    Passive ROM to R shoulder in to flexion, abduction and D2 with prolonged holds while performing MFR to R axilla (being mindful not to pull area of peeling skin near axilla) and upper arm                          PT Long Term Goals - 09/19/21 1204       PT LONG TERM GOAL #1   Title Patient will demonstrate she has regained full shoulder ROM and function post operatively compared to baselines.    Time 4    Period Weeks    Status On-going    Target Date 10/17/21      PT LONG TERM GOAL #2   Title Patient will increase right shoulder active flexion to >/= 150 degrees for increased ease reaching.    Baseline 117 post op; 151 pre-op;  147 degrees - 21/28/22    Time 4    Period Weeks    Status On-going    Target Date 10/17/21      PT LONG TERM GOAL #3   Title Patient will increase right shoulder active abduction to >/= 160 degrees for increased ease obtaining radiation positioning.    Baseline 98 post op; 161 pre-op; 141 degrees - 09/19/21    Time 4    Period Weeks    Status On-going    Target Date 10/17/21      PT LONG TERM GOAL #4   Title Patient will improve her DASH score to be back to zero for improved overall arm function.    Baseline 27.27 post op; 0 pre-op    Time 4    Period Weeks    Status On-going    Target Date 10/17/21      PT LONG TERM GOAL #5   Title Patient will verbalize good understanding of lymphedema risk reduction practices.    Time 4    Period Weeks    Status On-going    Target Date 10/17/21                   Plan - 09/19/21 1208     Clinical Impression Statement Renewal done today. Pt returns back to physical therapy after having completed radiation last week. She has some peelin skin at superior and inferior aspects of breast along with near her axilla where breast meets axilla creates a skin fold. So was minful of avoiding these areas with all manual therapy today. Remeasured her A/ROM of Rt shoulder and she has overall improved since post op measuremnts, but has not quite met goals. Pt will benefit from continuing physical therapy at this time to cont her progress towards goals and instruct her in self MLD to allow for better healing from radiation in the weeks andmonths to come. Pt is agreeable to this.    Stability/Clinical Decision Making Stable/Uncomplicated    Rehab Potential Excellent    PT Frequency 2x / week    PT Duration 4 weeks    PT Treatment/Interventions ADLs/Self Care Home Management;Therapeutic exercise;Patient/family education;Passive range of motion;Manual techniques;Manual lymph drainage;Therapeutic activities;Scar mobilization    PT Next Visit Plan  Renewal done this visit. Instruct in self MLD, cont Rt shoulder PROM, myofascial release for cording; add foam to  bra in area of seroma once skin healed, give supine scap    PT Home Exercise Plan Post op shoulder ROM HEP, scar massage    Consulted and Agree with Plan of Care Patient             Patient will benefit from skilled therapeutic intervention in order to improve the following deficits and impairments:  Postural dysfunction, Decreased range of motion, Decreased knowledge of precautions, Impaired UE functional use, Pain, Increased fascial restricitons, Increased edema, Decreased scar mobility  Visit Diagnosis: Stiffness of right shoulder, not elsewhere classified  Aftercare following surgery for neoplasm  Abnormal posture  Malignant neoplasm of upper-outer quadrant of right breast in female, estrogen receptor positive [facility])     Problem List Patient Active Problem List   Diagnosis Date Noted   Genetic testing 06/27/2021   Family history of breast cancer 06/20/2021   Family history of colon cancer 06/20/2021   Malignant neoplasm of upper-outer quadrant of right breast in female, estrogen receptor positive ([place]) 06/15/2021   Hypokalemia    SBO (small bowel obstruction) ([place]) 05/25/2019   Essential hypertension 05/25/2019    Otelia Limes, PTA 09/19/2021, 1:56 PM  [facility] @ [place], [place], [address] Phone: [phone]   Fax:  [phone]  Name: JAMARIE JOPLIN MRN: [id number] Date of Birth: 1956-06-27   PHYSICAL THERAPY DISCHARGE SUMMARY  Visits from Start of Care: 6  Current functional level related to goals / functional outcomes: See above   Remaining deficits: See above   Education / Equipment: HEP   Patient agrees to discharge. Patient goals were not met. Patient is being discharged due to not returning since the last visit.  Allyson Sabal [place], [place] 02/25/22  2:24 PM

## 2021-09-19 NOTE — Addendum Note (Signed)
Addended by: Stark Bray on: 09/19/2021 02:54 PM   Modules accepted: Orders

## 2021-09-25 ENCOUNTER — Other Ambulatory Visit (HOSPITAL_COMMUNITY): Payer: Self-pay | Admitting: Family Medicine

## 2021-09-25 DIAGNOSIS — R55 Syncope and collapse: Secondary | ICD-10-CM

## 2021-09-26 ENCOUNTER — Ambulatory Visit (HOSPITAL_COMMUNITY)
Admission: RE | Admit: 2021-09-26 | Discharge: 2021-09-26 | Disposition: A | Discharge status: Home / Self Care | Payer: Medicare HMO | Source: Ambulatory Visit | Attending: Family Medicine | Admitting: Family Medicine

## 2021-09-26 ENCOUNTER — Other Ambulatory Visit: Payer: Self-pay

## 2021-09-26 DIAGNOSIS — R55 Syncope and collapse: Secondary | ICD-10-CM | POA: Diagnosis not present

## 2021-09-26 DIAGNOSIS — I10 Essential (primary) hypertension: Secondary | ICD-10-CM | POA: Diagnosis not present

## 2021-09-26 LAB — ECHOCARDIOGRAM COMPLETE
Area-P 1/2: 2.87 cm2
S' Lateral: 3 cm

## 2021-10-10 DIAGNOSIS — R55 Syncope and collapse: Secondary | ICD-10-CM | POA: Diagnosis not present

## 2021-10-10 DIAGNOSIS — I10 Essential (primary) hypertension: Secondary | ICD-10-CM | POA: Diagnosis not present

## 2021-10-12 NOTE — Progress Notes (Signed)
° °                                                                                                                                                          °  Patient Name: Tiffany Walsh MRN: [id number] DOB: 10-Jul-1956 Referring Physician: Lurline Del (Profile Not Attached) Date of Service: 09/12/2021 [facility], [place]                                                        End Of Treatment Note  Diagnoses: C50.411-Malignant neoplasm of upper-outer quadrant of right female breast  Cancer Staging:  Cancer Staging  Malignant neoplasm of upper-outer quadrant of right breast in female, estrogen receptor positive ([place]) Staging form: Breast, AJCC 8th Edition - Clinical stage from 06/20/2021: Stage IA (cT1b, cN0, cM0, G1, ER+, PR+, HER2-) - Signed by Chauncey Cruel, MD on 06/20/2021 Stage prefix: Initial diagnosis Histologic grading system: 3 grade system Laterality: Right Staged by: Pathologist and managing physician Stage used in treatment planning: Yes National guidelines used in treatment planning: Yes Type of national guideline used in treatment planning: NCCN  pT1c,pN0  Intent: Curative  Radiation Treatment Dates: 08/22/2021 through 09/12/2021 Site Technique Total Dose (Gy) Dose per Fx (Gy) Completed Fx Beam Energies  Breast, Right: Breast_Rt 3D 42.56/42.56 2.66 16/16 6X, 10X   Narrative: The patient tolerated radiation therapy relatively well.   Plan: The patient will follow-up with radiation oncology in 1moor prn .  -----------------------------------  SEppie Gibson MD

## 2021-10-19 ENCOUNTER — Telehealth: Payer: Self-pay | Admitting: *Deleted

## 2021-10-22 ENCOUNTER — Encounter: Payer: Self-pay | Admitting: Physical Therapy

## 2021-10-22 ENCOUNTER — Telehealth: Payer: Self-pay

## 2021-10-22 ENCOUNTER — Ambulatory Visit: Payer: Medicare HMO | Attending: Surgery | Admitting: Physical Therapy

## 2021-10-22 ENCOUNTER — Other Ambulatory Visit: Payer: Self-pay

## 2021-10-22 DIAGNOSIS — C50411 Malignant neoplasm of upper-outer quadrant of right female breast: Secondary | ICD-10-CM | POA: Insufficient documentation

## 2021-10-22 DIAGNOSIS — Z170 Estrogen receptor positive status [ER+]: Secondary | ICD-10-CM | POA: Insufficient documentation

## 2021-10-22 NOTE — Telephone Encounter (Signed)
I called the patient today about her upcoming follow-up appointment in radiation oncology.   Given the state of the COVID-19 pandemic, concerning case numbers in our community, and guidance from [facility], I offered a phone assessment with the patient to determine if coming to the clinic was necessary. She accepted.  The patient denies any symptomatic concerns.  She reports her fatigue has improved greatly, and overall she is feeling well. She reports mild tenderness to her underarm when she first wakes up in the morning, but states it resolves as the day progresses. Specifically, she reports good healing of her skin in the radiation fields.  Skin is intact and slowly returning to it's baseline color/texture. I recommended that she continue skin care by applying oil or lotion with vitamin E to the skin in the radiation fields, BID, for 2 more months.    Continue follow-up with medical oncology - follow-up is scheduled on 12/10/2021 with Dr. Truitt Merle.  I explained that yearly mammograms are important for patients with intact breast tissue, and physical exams are important after mastectomy for patients that cannot undergo mammography.  I encouraged her to call if she had further questions or concerns about her healing. Otherwise, she will follow-up PRN in radiation oncology. Patient is pleased with this plan, and we will cancel her upcoming follow-up to reduce the risk of COVID-19 transmission.

## 2021-10-22 NOTE — Therapy (Signed)
[facility] [place] [place], [place], [address] Phone: [phone]   Fax:  [phone]  Physical Therapy Treatment  Patient Details  Name: Tiffany Walsh MRN: [id number] Date of Birth: 01-11-1956 Referring Provider (PT): Dr. Nedra Hai   Encounter Date: 10/22/2021   PT End of Session - 10/22/21 1105     Visit Number 6   unchanged due to screen only   Number of Visits 10    PT Start Time 1101    Activity Tolerance Patient tolerated treatment well    Behavior During Therapy [facility] for tasks assessed/performed             Past Medical History:  Diagnosis Date   Abdominal pain, LLQ    Anemia    Arthritis of right knee    Cervical dysplasia    CIN I (cervical intraepithelial neoplasia I)    CIN II (cervical intraepithelial neoplasia II)    Colon cancer (HCC)    Epigastric pain    GERD (gastroesophageal reflux disease)    Heme positive stool    History of rectal cancer    Hx SBO    Hypercholesterolemia    Hypertension    Malignant neoplasm of rectum, rectosigmoid junction, and anus    Nausea with vomiting, unspecified    Peripheral neuropathy due to chemotherapy ([place])    Rectal cancer ([place])    stage 3   Vaginal delivery 3428,7681    Past Surgical History:  Procedure Laterality Date   BREAST LUMPECTOMY WITH RADIOACTIVE SEED AND SENTINEL LYMPH NODE BIOPSY Right 07/12/2021   Procedure: RIGHT BREAST LUMPECTOMY WITH RADIOACTIVE SEED AND SENTINEL LYMPH NODE BIOPSY;  Surgeon: Coralie Keens, MD;  Location: [facility];  Service: General;  Laterality: Right;   CERVICAL CONIZATION W/BX N/A 04/04/2015   Procedure: CONIZATION CERVIX WITH BIOPSY;  Surgeon: Thurnell Lose, MD;  Location: [facility];  Service: Gynecology;  Laterality: N/A;   COLON SURGERY     MOUTH SURGERY     widom teeth       There were no vitals filed for this visit.   Subjective Assessment - 10/22/21 1105     Subjective Pt  reports she is doing well. She is here for 3 month SOZO.    Pertinent History Patient was diagnosed on 05/23/2021 with right grade I invasive ductal carcinoma breast cancer.She underwent a right lumpectomy and sentinel node biopsy (5 negative nodes) on 07/12/2021. It is ER/PR positive and HER2 negative with a Ki67 of 10%.    Currently in Pain? No/denies    Pain Score 0-No pain                    L-DEX FLOWSHEETS - 10/22/21 1100       L-DEX LYMPHEDEMA SCREENING   Measurement Type Unilateral    L-DEX MEASUREMENT EXTREMITY Upper Extremity    POSITION  Standing    DOMINANT SIDE Right    At Risk Side Right    BASELINE SCORE (UNILATERAL) -1.7    L-DEX SCORE (UNILATERAL) 0.7    VALUE CHANGE (UNILAT) 2.4                                     PT Long Term Goals - 09/19/21 1204       PT LONG TERM GOAL #1   Title Patient will demonstrate she has regained full shoulder  ROM and function post operatively compared to baselines.    Time 4    Period Weeks    Status On-going    Target Date 10/17/21      PT LONG TERM GOAL #2   Title Patient will increase right shoulder active flexion to >/= 150 degrees for increased ease reaching.    Baseline 117 post op; 151 pre-op; 147 degrees - 21/28/22    Time 4    Period Weeks    Status On-going    Target Date 10/17/21      PT LONG TERM GOAL #3   Title Patient will increase right shoulder active abduction to >/= 160 degrees for increased ease obtaining radiation positioning.    Baseline 98 post op; 161 pre-op; 141 degrees - 09/19/21    Time 4    Period Weeks    Status On-going    Target Date 10/17/21      PT LONG TERM GOAL #4   Title Patient will improve her DASH score to be back to zero for improved overall arm function.    Baseline 27.27 post op; 0 pre-op    Time 4    Period Weeks    Status On-going    Target Date 10/17/21      PT LONG TERM GOAL #5   Title Patient will verbalize good understanding of  lymphedema risk reduction practices.    Time 4    Period Weeks    Status On-going    Target Date 10/17/21                   Plan - 10/22/21 1106     Clinical Impression Statement Pt returns to PT for SOZO screen. Educated pt about SOZO and how it can detect subclinical lymphedema and the importance of doing this every 3 months for the first 2 years after surgery. Pt's l-dex score today was 0.7 which is a 2.4 change from baseline which is less than the 6.5 change for it to be considered subclinical lymphedema. Pt would benefit from continued screening every 3 months for the first 2 years after surgery.    PT Next Visit Plan Ldex screening every 3 months for 2 years after surgery    Consulted and Agree with Plan of Care Patient             Patient will benefit from skilled therapeutic intervention in order to improve the following deficits and impairments:     Visit Diagnosis: Malignant neoplasm of upper-outer quadrant of right breast in female, estrogen receptor positive [facility])     Problem List Patient Active Problem List   Diagnosis Date Noted   Genetic testing 06/27/2021   Family history of breast cancer 06/20/2021   Family history of colon cancer 06/20/2021   Malignant neoplasm of upper-outer quadrant of right breast in female, estrogen receptor positive ([place]) 06/15/2021   Hypokalemia    SBO (small bowel obstruction) ([place]) 05/25/2019   Essential hypertension 05/25/2019    Manus Gunning, PT 10/22/2021, 11:13 AM  [facility] @ [address] [place] [place], [place], [address] Phone: [phone]   Fax:  [phone]  Name: Tiffany Walsh MRN: [id number] Date of Birth: 11-27-1955

## 2021-10-24 ENCOUNTER — Ambulatory Visit: Payer: Self-pay | Admitting: Radiation Oncology

## 2021-11-01 ENCOUNTER — Telehealth: Payer: Self-pay | Admitting: *Deleted

## 2021-11-13 ENCOUNTER — Telehealth: Payer: Self-pay | Admitting: *Deleted

## 2021-11-13 DIAGNOSIS — Z0000 Encounter for general adult medical examination without abnormal findings: Secondary | ICD-10-CM | POA: Diagnosis not present

## 2021-11-13 DIAGNOSIS — E7800 Pure hypercholesterolemia, unspecified: Secondary | ICD-10-CM | POA: Diagnosis not present

## 2021-11-13 DIAGNOSIS — E2839 Other primary ovarian failure: Secondary | ICD-10-CM | POA: Diagnosis not present

## 2021-11-13 DIAGNOSIS — K219 Gastro-esophageal reflux disease without esophagitis: Secondary | ICD-10-CM | POA: Diagnosis not present

## 2021-11-13 DIAGNOSIS — G620 Drug-induced polyneuropathy: Secondary | ICD-10-CM | POA: Diagnosis not present

## 2021-11-13 DIAGNOSIS — Z23 Encounter for immunization: Secondary | ICD-10-CM | POA: Diagnosis not present

## 2021-11-13 DIAGNOSIS — L290 Pruritus ani: Secondary | ICD-10-CM | POA: Diagnosis not present

## 2021-11-13 DIAGNOSIS — D649 Anemia, unspecified: Secondary | ICD-10-CM | POA: Diagnosis not present

## 2021-11-13 DIAGNOSIS — I10 Essential (primary) hypertension: Secondary | ICD-10-CM | POA: Diagnosis not present

## 2021-11-13 NOTE — Telephone Encounter (Signed)
Pt called with c/o "discharge" after starting Tamoxifen in Jan 2023. Pt noted not a yeast infection. Pt would like to discuss her symptoms with Dr. Burr Medico. Discussed with pt that she can stop Tamoxifen x2 wks to see if symptoms are relieved. Pt wishes to discuss first with Dr. Burr Medico. Scheduled and confirmed appt on 2/24 at 3pm. No further needs voiced.

## 2021-11-15 ENCOUNTER — Encounter (HOSPITAL_COMMUNITY): Payer: Self-pay

## 2021-11-16 ENCOUNTER — Inpatient Hospital Stay: Payer: Medicare HMO

## 2021-11-16 ENCOUNTER — Other Ambulatory Visit: Payer: Self-pay

## 2021-11-16 ENCOUNTER — Encounter: Payer: Self-pay | Admitting: Hematology

## 2021-11-16 ENCOUNTER — Inpatient Hospital Stay: Payer: Medicare HMO | Attending: Hematology | Admitting: Hematology

## 2021-11-16 VITALS — BP 122/80 | HR 58 | Temp 98.2°F | Resp 18 | Wt 123.6 lb

## 2021-11-16 DIAGNOSIS — C50411 Malignant neoplasm of upper-outer quadrant of right female breast: Secondary | ICD-10-CM

## 2021-11-16 DIAGNOSIS — Z85048 Personal history of other malignant neoplasm of rectum, rectosigmoid junction, and anus: Secondary | ICD-10-CM | POA: Diagnosis not present

## 2021-11-16 DIAGNOSIS — Z170 Estrogen receptor positive status [ER+]: Secondary | ICD-10-CM | POA: Diagnosis not present

## 2021-11-16 LAB — COMPREHENSIVE METABOLIC PANEL
ALT: 14 U/L (ref 0–44)
AST: 19 U/L (ref 15–41)
Albumin: 4.5 g/dL (ref 3.5–5.0)
Alkaline Phosphatase: 72 U/L (ref 38–126)
Anion gap: 7 (ref 5–15)
BUN: 17 mg/dL (ref 8–23)
CO2: 28 mmol/L (ref 22–32)
Calcium: 9.2 mg/dL (ref 8.9–10.3)
Chloride: 106 mmol/L (ref 98–111)
Creatinine, Ser: 0.86 mg/dL (ref 0.44–1.00)
GFR, Estimated: >60 mL/min (ref >60)
Glucose, Bld: 83 mg/dL (ref 70–99)
Potassium: 3.5 mmol/L (ref 3.5–5.1)
Sodium: 141 mmol/L (ref 135–145)
Total Bilirubin: 0.3 mg/dL (ref 0.3–1.2)
Total Protein: 7.5 g/dL (ref 6.5–8.1)

## 2021-11-16 LAB — CBC WITH DIFFERENTIAL/PLATELET
Abs Immature Granulocytes: 0.01 10*3/uL (ref 0.00–0.07)
Basophils Absolute: 0.1 10*3/uL (ref 0.0–0.1)
Basophils Relative: 1 %
Eosinophils Absolute: 0.1 10*3/uL (ref 0.0–0.5)
Eosinophils Relative: 2 %
HCT: 35.1 % — ABNORMAL LOW (ref 36.0–46.0)
Hemoglobin: 11.1 g/dL — ABNORMAL LOW (ref 12.0–15.0)
Immature Granulocytes: 0 %
Lymphocytes Relative: 20 %
Lymphs Abs: 0.9 10*3/uL (ref 0.7–4.0)
MCH: 28 pg (ref 26.0–34.0)
MCHC: 31.6 g/dL (ref 30.0–36.0)
MCV: 88.6 fL (ref 80.0–100.0)
Monocytes Absolute: 0.5 10*3/uL (ref 0.1–1.0)
Monocytes Relative: 11 %
Neutro Abs: 2.9 10*3/uL (ref 1.7–7.7)
Neutrophils Relative %: 66 %
Platelets: 150 10*3/uL (ref 150–400)
RBC: 3.96 MIL/uL (ref 3.87–5.11)
RDW: 15.1 % (ref 11.5–15.5)
WBC: 4.4 10*3/uL (ref 4.0–10.5)
nRBC: 0 % (ref 0.0–0.2)

## 2021-11-16 MED ORDER — ANASTROZOLE 1 MG PO TABS: 1.0000 mg | ORAL_TABLET | Freq: Every day | ORAL | 5 refills | Status: DC

## 2021-11-16 NOTE — Progress Notes (Signed)
[facility]   [phone] [phone]   Clinic Follow up Note   Patient Care Team: Alroy Dust, L.Marlou Sa, MD as PCP - General (Family Medicine) Mauro Kaufmann, RN as Oncology Nurse Navigator Rockwell Germany, RN as Oncology Nurse Navigator Coralie Keens, MD as Consulting Physician (General Surgery) Magrinat, Virgie Dad, MD (Inactive) as Consulting Physician (Oncology) Eppie Gibson, MD as Attending Physician (Radiation Oncology) Jerline Pain, MD as Consulting Physician (Cardiology)  Date of Service:  11/16/2021  CHIEF COMPLAINT: f/u of right breast cancer  CURRENT THERAPY:  Antiestrogen Therapy started 09/23/21, to start anastrozole 11/17/21.  ASSESSMENT & PLAN:  Tiffany Walsh is a 66 y.o. female with   1. Malignant neoplasm of upper-outer quadrant of right breast, stage IA p(T1c, N0), ER+/PR+/Her2-, Grade 1 -found on screening mammogram. Biopsy on 06/11/21 showed invasive ductal carcinoma, low grade. -right lumpectomy on 07/12/21 by Dr. Ninfa Linden. Pathology showed 1.5 cm invasive and in situ ductal carcinoma. Margins and lymph nodes negative. -Oncotype RS of 16, low risk -she received radiation 11/20-12/21/22 under Dr. Isidore Moos -she started tamoxifen on 09/23/21. She reports vaginal wetness/discharge that is bothersome. She otherwise is tolerating Tamoxifen well. I discussed switching her to anastrozole, as initially planned; she is agreeable. We reviewed possible side effects, especially risk of osteopenia and osteoporosis, arthralgia, etc.  She agrees to proceed.  She has no baseline arthritis. -she is otherwise doing very well. We will obtain labs today. Physical exam was unremarkable. -She will be due for mammogram in 05/2022. She goes to [facility].  2. Bone Health -We discussed that anastrozole can decrease bone density. We will monitor every 2 years. -her last DEXA with her GYN was ~2 years ago, will plan for repeat this year.  3. Genetics -she has  5 relatives with cancer, one of which was breast.  -testing on 07/09/21 was negative, with VUS in MSH6  4. H/o Rectal cancer p(T3, N1) in 2009 -treated with concurrent chemoRT with Xeloda and 6 months of CAPOX under Dr. Alen Blew.   PLAN: -lab today -stop tamoxifen and start anastrozole, I called in today  -lab and f/u with NP Mendel Ryder in 6 months -mammogram will be due 05/2022.   No problem-specific Assessment & Plan notes found for this encounter.   SUMMARY OF ONCOLOGIC HISTORY: Oncology History  Malignant neoplasm of upper-outer quadrant of right breast in female, estrogen receptor positive ([place])  06/15/2021 Initial Diagnosis   Malignant neoplasm of upper-outer quadrant of right breast in female, estrogen receptor positive ([place])   06/20/2021 Cancer Staging   Staging form: Breast, AJCC 8th Edition - Clinical stage from 06/20/2021: Stage IA (cT1b, cN0, cM0, G1, ER+, PR+, HER2-) - Signed by Chauncey Cruel, MD on 06/20/2021 Stage prefix: Initial diagnosis Histologic grading system: 3 grade system Laterality: Right Staged by: Pathologist and managing physician Stage used in treatment planning: Yes National guidelines used in treatment planning: Yes Type of national guideline used in treatment planning: NCCN    07/09/2021 Genetic Testing   [facility] CustomNext Panel was negative. No pathogenic variants were identified in the 47 genes analyzed. A variant of uncertain significance was detected in the MSH6 gene. Report date is 07/09/2021.  The CustomNext-Cancer+RNAinsight panel offered by [facility] includes sequencing and rearrangement analysis for the following 47 genes:  APC, ATM, AXIN2, BARD1, BMPR1A, BRCA1, BRCA2, BRIP1, CDH1, CDK4, CDKN2A, CHEK2, DICER1, EPCAM, GREM1, HOXB13, MEN1, MLH1, MSH2, MSH3, MSH6, MUTYH, NBN, NF1, NF2, NTHL1, PALB2, PMS2, POLD1, POLE, PTEN, RAD51C, RAD51D, RECQL, RET,  SDHA, SDHAF2, SDHB, SDHC, SDHD, SMAD4, SMARCA4, STK11, TP53, TSC1, TSC2, and VHL.  RNA data  is routinely analyzed for use in variant interpretation for all genes.      INTERVAL HISTORY:  [place] Tiffany Walsh is here for a follow up of breast cancer. She was last seen by Dr. Jana Hakim on 08/20/21. I am taking over her care with Dr. Virgie Dad retirement. She presents to the clinic alone. She reports she began tamoxifen on 09/23/21. On chart review, Dr. Virgie Dad note says he planned to prescribe anastrozole, but the medicine that was called in was tamoxifen. She reports she is having vaginal wetness/discharge, which she notes it bothersome during intimacy. She also notes hot flashes that are tolerable.   All other systems were reviewed with the patient and are negative.  MEDICAL HISTORY:  Past Medical History:  Diagnosis Date   Abdominal pain, LLQ    Anemia    Arthritis of right knee    Cervical dysplasia    CIN I (cervical intraepithelial neoplasia I)    CIN II (cervical intraepithelial neoplasia II)    Colon cancer (HCC)    Epigastric pain    GERD (gastroesophageal reflux disease)    Heme positive stool    History of rectal cancer    Hx SBO    Hypercholesterolemia    Hypertension    Malignant neoplasm of rectum, rectosigmoid junction, and anus    Nausea with vomiting, unspecified    Peripheral neuropathy due to chemotherapy (HCC)    Rectal cancer (HCC)    stage 3   Vaginal delivery 2355,7322    SURGICAL HISTORY: Past Surgical History:  Procedure Laterality Date   BREAST LUMPECTOMY WITH RADIOACTIVE SEED AND SENTINEL LYMPH NODE BIOPSY Right 07/12/2021   Procedure: RIGHT BREAST LUMPECTOMY WITH RADIOACTIVE SEED AND SENTINEL LYMPH NODE BIOPSY;  Surgeon: Coralie Keens, MD;  Location: [facility];  Service: General;  Laterality: Right;   CERVICAL CONIZATION W/BX N/A 04/04/2015   Procedure: CONIZATION CERVIX WITH BIOPSY;  Surgeon: Thurnell Lose, MD;  Location: [facility];  Service: Gynecology;  Laterality: N/A;   COLON SURGERY     MOUTH SURGERY     widom teeth        I have reviewed the social history and family history with the patient and they are unchanged from previous note.  ALLERGIES:  has No Known Allergies.  MEDICATIONS:  Current Outpatient Medications  Medication Sig Dispense Refill   acetaminophen (TYLENOL) 325 MG tablet Take 650 mg by mouth every 6 (six) hours as needed for mild pain or headache.     anastrozole (ARIMIDEX) 1 MG tablet Take 1 tablet (1 mg total) by mouth daily. 30 tablet 5   aspirin EC 81 MG tablet Take 1 tablet (81 mg total) by mouth every other day. Swallow whole. 90 tablet 3   cholecalciferol (VITAMIN D3) 25 MCG (1000 UNIT) tablet Take 1,000 Units by mouth daily.     esomeprazole (NEXIUM) 40 MG capsule Take 40 mg by mouth 2 (two) times daily before a meal.     famotidine (PEPCID) 20 MG tablet Take 20 mg by mouth 2 (two) times daily.     ferrous sulfate 325 (65 FE) MG EC tablet Take 1 tablet (325 mg total) by mouth every Monday, Wednesday, and Friday. Take one tablet twice a day. 30 tablet 0   metoprolol succinate (TOPROL-XL) 50 MG 24 hr tablet Take 50 mg by mouth daily.     rosuvastatin (CRESTOR) 20 MG tablet TAKE  1 TABLET BY MOUTH ONCE DAILY . APPOINTMENT REQUIRED FOR FUTURE REFILLS 90 tablet 3   verapamil (CALAN-SR) 240 MG CR tablet Take 360 mg by mouth daily.     No current facility-administered medications for this visit.    PHYSICAL EXAMINATION: ECOG PERFORMANCE STATUS: 0 - Asymptomatic  Vitals:   11/16/21 1532  BP: 122/80  Pulse: (!) 58  Resp: 18  Temp: 98.2 F (36.8 C)   Wt Readings from Last 3 Encounters:  11/16/21 123 lb 9 oz (56 kg)  08/20/21 125 lb 11.2 oz (57 kg)  08/13/21 124 lb 4 oz (56.4 kg)     GENERAL:alert, no distress and comfortable SKIN: skin color, texture, turgor are normal, no rashes or significant lesions EYES: normal, Conjunctiva are pink and non-injected, sclera clear  NECK: supple, thyroid normal size, non-tender, without nodularity LYMPH:  no palpable lymphadenopathy in  the cervical, axillary  LUNGS: clear to auscultation and percussion with normal breathing effort HEART: regular rate & rhythm and no murmurs and no lower extremity edema ABDOMEN:abdomen soft, non-tender and normal bowel sounds Musculoskeletal:no cyanosis of digits and no clubbing  NEURO: alert & oriented x 3 with fluent speech, no focal motor/sensory deficits BREAST: No palpable mass, nodules or adenopathy bilaterally. Breast exam benign.   LABORATORY DATA:  I have reviewed the data as listed CBC Latest Ref Rng & Units 11/16/2021 06/20/2021 06/06/2020  WBC 4.0 - 10.5 K/uL 4.4 4.0 6.5  Hemoglobin 12.0 - 15.0 g/dL 11.1(L) 10.9(L) 12.9  Hematocrit 36.0 - 46.0 % 35.1(L) 33.5(L) 38.9  Platelets 150 - 400 K/uL 150 149(L) 183     CMP Latest Ref Rng & Units 11/16/2021 06/20/2021 09/06/2020  Glucose 70 - 99 mg/dL 83 94 -  BUN 8 - 23 mg/dL 17 13 -  Creatinine 0.44 - 1.00 mg/dL 0.86 0.80 -  Sodium 135 - 145 mmol/L 141 144 -  Potassium 3.5 - 5.1 mmol/L 3.5 4.2 -  Chloride 98 - 111 mmol/L 106 109 -  CO2 22 - 32 mmol/L 28 25 -  Calcium 8.9 - 10.3 mg/dL 9.2 9.1 -  Total Protein 6.5 - 8.1 g/dL 7.5 7.2 -  Total Bilirubin 0.3 - 1.2 mg/dL 0.3 0.4 -  Alkaline Phos 38 - 126 U/L 72 96 -  AST 15 - 41 U/L 19 19 -  ALT 0 - 44 U/L '14 15 19      ' RADIOGRAPHIC STUDIES: I have personally reviewed the radiological images as listed and agreed with the findings in the report. No results found.    Orders Placed This Encounter  Procedures   MM DIAG BREAST TOMO BILATERAL    Standing Status:   Future    Standing Expiration Date:   11/16/2022    Scheduling Instructions:     [facility]    Order Specific Question:   Reason for Exam (SYMPTOM  OR DIAGNOSIS REQUIRED)    Answer:   screening    Order Specific Question:   Preferred imaging location?    Answer:   External   CBC with Differential/Platelet    Standing Status:   Standing    Number of Occurrences:   50    Standing Expiration Date:   11/16/2022    Comprehensive metabolic panel    Standing Status:   Standing    Number of Occurrences:   50    Standing Expiration Date:   11/16/2022   All questions were answered. The patient knows to call the clinic with any problems, questions or concerns.  No barriers to learning was detected. The total time spent in the appointment was 30 minutes.     Truitt Merle, MD 11/16/2021   I, Wilburn Mylar, am acting as scribe for Truitt Merle, MD.   I have reviewed the above documentation for accuracy and completeness, and I agree with the above.

## 2021-11-28 DIAGNOSIS — Z780 Asymptomatic menopausal state: Secondary | ICD-10-CM | POA: Diagnosis not present

## 2021-12-10 ENCOUNTER — Inpatient Hospital Stay: Payer: Medicare HMO | Admitting: Hematology

## 2021-12-10 DIAGNOSIS — C50411 Malignant neoplasm of upper-outer quadrant of right female breast: Secondary | ICD-10-CM

## 2021-12-12 DIAGNOSIS — E785 Hyperlipidemia, unspecified: Secondary | ICD-10-CM | POA: Diagnosis not present

## 2021-12-12 DIAGNOSIS — Z87891 Personal history of nicotine dependence: Secondary | ICD-10-CM | POA: Diagnosis not present

## 2021-12-12 DIAGNOSIS — K219 Gastro-esophageal reflux disease without esophagitis: Secondary | ICD-10-CM | POA: Diagnosis not present

## 2021-12-12 DIAGNOSIS — I10 Essential (primary) hypertension: Secondary | ICD-10-CM | POA: Diagnosis not present

## 2021-12-12 DIAGNOSIS — Z79811 Long term (current) use of aromatase inhibitors: Secondary | ICD-10-CM | POA: Diagnosis not present

## 2021-12-12 DIAGNOSIS — D630 Anemia in neoplastic disease: Secondary | ICD-10-CM | POA: Diagnosis not present

## 2021-12-12 DIAGNOSIS — I2510 Atherosclerotic heart disease of native coronary artery without angina pectoris: Secondary | ICD-10-CM | POA: Diagnosis not present

## 2021-12-12 DIAGNOSIS — C50919 Malignant neoplasm of unspecified site of unspecified female breast: Secondary | ICD-10-CM | POA: Diagnosis not present

## 2021-12-12 DIAGNOSIS — Z7982 Long term (current) use of aspirin: Secondary | ICD-10-CM | POA: Diagnosis not present

## 2021-12-12 DIAGNOSIS — Z809 Family history of malignant neoplasm, unspecified: Secondary | ICD-10-CM | POA: Diagnosis not present

## 2021-12-31 ENCOUNTER — Ambulatory Visit: Payer: Medicare HMO | Attending: Surgery

## 2021-12-31 VITALS — Wt 121.2 lb

## 2021-12-31 DIAGNOSIS — Z483 Aftercare following surgery for neoplasm: Secondary | ICD-10-CM | POA: Insufficient documentation

## 2021-12-31 NOTE — Therapy (Signed)
?OUTPATIENT PHYSICAL THERAPY SOZO SCREENING NOTE ? ? ?Patient Name: Tiffany Walsh ?MRN: [id number] ?DOB:November 04, 1955, 66 y.o., female ?Today's Date: 12/31/2021 ? ?PCP: Alroy Dust, L.Marlou Sa, MD ?REFERRING PROVIDER: Coralie Keens, MD ? ? PT End of Session - 12/31/21 1038   ? ? Visit Number 6   # unchanged due to screen only  ? PT Start Time 1037   ? PT Stop Time 1042   then 1050-1055 as SOZO had difficulty getting a reading so wet foot also  ? PT Time Calculation (min) 5 min   ? Activity Tolerance Patient tolerated treatment well   ? Behavior During Therapy [facility] for tasks assessed/performed   ? ?  ?  ? ?  ? ? ?Past Medical History:  ?Diagnosis Date  ? Abdominal pain, LLQ   ? Anemia   ? Arthritis of right knee   ? Cervical dysplasia   ? CIN I (cervical intraepithelial neoplasia I)   ? CIN II (cervical intraepithelial neoplasia II)   ? Colon cancer ([place])   ? Epigastric pain   ? GERD (gastroesophageal reflux disease)   ? Heme positive stool   ? History of rectal cancer   ? Hx SBO   ? Hypercholesterolemia   ? Hypertension   ? Malignant neoplasm of rectum, rectosigmoid junction, and anus   ? Nausea with vomiting, unspecified   ? Peripheral neuropathy due to chemotherapy [facility])   ? Rectal cancer ([place])   ? stage 3  ? Vaginal delivery 6834,[id number]  ? ?Past Surgical History:  ?Procedure Laterality Date  ? BREAST LUMPECTOMY WITH RADIOACTIVE SEED AND SENTINEL LYMPH NODE BIOPSY Right 07/12/2021  ? Procedure: RIGHT BREAST LUMPECTOMY WITH RADIOACTIVE SEED AND SENTINEL LYMPH NODE BIOPSY;  Surgeon: Coralie Keens, MD;  Location: [facility];  Service: General;  Laterality: Right;  ? CERVICAL CONIZATION W/BX N/A 04/04/2015  ? Procedure: CONIZATION CERVIX WITH BIOPSY;  Surgeon: Thurnell Lose, MD;  Location: [facility];  Service: Gynecology;  Laterality: N/A;  ? COLON SURGERY    ? MOUTH SURGERY    ? widom teeth     ? ?Patient Active Problem List  ? Diagnosis Date Noted  ? Genetic testing 06/27/2021  ? Family history of breast  cancer 06/20/2021  ? Family history of colon cancer 06/20/2021  ? Malignant neoplasm of upper-outer quadrant of right breast in female, estrogen receptor positive ([place]) 06/15/2021  ? Hypokalemia   ? SBO (small bowel obstruction) ([place]) 05/25/2019  ? Essential hypertension 05/25/2019  ? ? ?REFERRING DIAG: right breast cancer at risk for lymphedema ? ?THERAPY DIAG: Aftercare following surgery for neoplasm ? ?PERTINENT HISTORY: Patient was diagnosed on 05/23/2021 with right grade I invasive ductal carcinoma breast cancer.She underwent a right lumpectomy and sentinel node biopsy (5 negative nodes) on 07/12/2021. It is ER/PR positive and HER2 negative with a Ki67 of 10%.  ? ?PRECAUTIONS: right UE Lymphedema risk, None ? ?SUBJECTIVE: Pt returns for her 3 month L-Dex screen.  ? ?PAIN:  ?Are you having pain? No ? ?SOZO SCREENING: ?Patient was assessed today using the SOZO machine to determine the lymphedema index score. This was compared to her baseline score. It was determined that she is within the recommended range when compared to her baseline and no further action is needed at this time. She will continue SOZO screenings. These are done every 3 months for 2 years post operatively followed by every 6 months for 2 years, and then annually. ? ? ? ?Otelia Limes, PTA ?12/31/2021, 10:58 AM ? ?  ? ?

## 2022-01-20 DIAGNOSIS — C50919 Malignant neoplasm of unspecified site of unspecified female breast: Secondary | ICD-10-CM | POA: Diagnosis not present

## 2022-01-20 DIAGNOSIS — I890 Lymphedema, not elsewhere classified: Secondary | ICD-10-CM | POA: Diagnosis not present

## 2022-01-28 ENCOUNTER — Ambulatory Visit: Payer: Medicare HMO

## 2022-04-01 DIAGNOSIS — C50911 Malignant neoplasm of unspecified site of right female breast: Secondary | ICD-10-CM | POA: Diagnosis not present

## 2022-04-17 ENCOUNTER — Telehealth: Payer: Self-pay | Admitting: Adult Health

## 2022-04-17 NOTE — Telephone Encounter (Signed)
Rescheduled appointment per provider PAL. Patient is aware of the changes made to her upcoming appointment. 

## 2022-04-19 ENCOUNTER — Other Ambulatory Visit: Payer: Self-pay | Admitting: Hematology

## 2022-05-03 ENCOUNTER — Other Ambulatory Visit: Payer: Self-pay | Admitting: Hematology

## 2022-05-06 ENCOUNTER — Other Ambulatory Visit: Payer: Self-pay | Admitting: Hematology

## 2022-05-14 ENCOUNTER — Ambulatory Visit: Payer: Medicare HMO | Admitting: Adult Health

## 2022-05-14 ENCOUNTER — Other Ambulatory Visit: Payer: Medicare HMO

## 2022-05-14 DIAGNOSIS — N3000 Acute cystitis without hematuria: Secondary | ICD-10-CM | POA: Diagnosis not present

## 2022-05-14 DIAGNOSIS — R309 Painful micturition, unspecified: Secondary | ICD-10-CM | POA: Diagnosis not present

## 2022-05-14 DIAGNOSIS — I10 Essential (primary) hypertension: Secondary | ICD-10-CM | POA: Diagnosis not present

## 2022-05-14 DIAGNOSIS — D649 Anemia, unspecified: Secondary | ICD-10-CM | POA: Diagnosis not present

## 2022-05-14 DIAGNOSIS — K219 Gastro-esophageal reflux disease without esophagitis: Secondary | ICD-10-CM | POA: Diagnosis not present

## 2022-05-14 DIAGNOSIS — E7800 Pure hypercholesterolemia, unspecified: Secondary | ICD-10-CM | POA: Diagnosis not present

## 2022-05-17 ENCOUNTER — Other Ambulatory Visit: Payer: Medicare HMO

## 2022-05-17 ENCOUNTER — Ambulatory Visit: Payer: Medicare HMO | Admitting: Adult Health

## 2022-05-20 ENCOUNTER — Ambulatory Visit: Payer: Medicare HMO | Attending: Surgery

## 2022-05-20 VITALS — Wt 120.2 lb

## 2022-05-20 DIAGNOSIS — Z483 Aftercare following surgery for neoplasm: Secondary | ICD-10-CM | POA: Insufficient documentation

## 2022-05-20 NOTE — Therapy (Signed)
OUTPATIENT PHYSICAL THERAPY SOZO SCREENING NOTE   Patient Name: Tiffany Walsh MRN: [id number] DOB:07/09/1956, 66 y.o., female Today's Date: 05/20/2022  PCP: Alroy Dust, L.Marlou Sa, MD REFERRING PROVIDER: Coralie Keens, MD   PT End of Session - 05/20/22 1026     Visit Number 6   # unchanged due to screen only   PT Start Time 1025    PT Stop Time 1029    PT Time Calculation (min) 4 min    Activity Tolerance Patient tolerated treatment well    Behavior During Therapy [facility] for tasks assessed/performed             Past Medical History:  Diagnosis Date   Abdominal pain, LLQ    Anemia    Arthritis of right knee    Cervical dysplasia    CIN I (cervical intraepithelial neoplasia I)    CIN II (cervical intraepithelial neoplasia II)    Colon cancer (HCC)    Epigastric pain    GERD (gastroesophageal reflux disease)    Heme positive stool    History of rectal cancer    Hx SBO    Hypercholesterolemia    Hypertension    Malignant neoplasm of rectum, rectosigmoid junction, and anus    Nausea with vomiting, unspecified    Peripheral neuropathy due to chemotherapy ([place])    Rectal cancer ([place])    stage 3   Vaginal delivery 2993,7169   Past Surgical History:  Procedure Laterality Date   BREAST LUMPECTOMY WITH RADIOACTIVE SEED AND SENTINEL LYMPH NODE BIOPSY Right 07/12/2021   Procedure: RIGHT BREAST LUMPECTOMY WITH RADIOACTIVE SEED AND SENTINEL LYMPH NODE BIOPSY;  Surgeon: Coralie Keens, MD;  Location: [place];  Service: General;  Laterality: Right;   CERVICAL CONIZATION W/BX N/A 04/04/2015   Procedure: CONIZATION CERVIX WITH BIOPSY;  Surgeon: Thurnell Lose, MD;  Location: [facility];  Service: Gynecology;  Laterality: N/A;   COLON SURGERY     MOUTH SURGERY     widom teeth      Patient Active Problem List   Diagnosis Date Noted   Genetic testing 06/27/2021   Family history of breast cancer 06/20/2021   Family history of colon cancer 06/20/2021   Malignant  neoplasm of upper-outer quadrant of right breast in female, estrogen receptor positive ([place]) 06/15/2021   Hypokalemia    SBO (small bowel obstruction) ([place]) 05/25/2019   Essential hypertension 05/25/2019    REFERRING DIAG: right breast cancer at risk for lymphedema  THERAPY DIAG: Aftercare following surgery for neoplasm  PERTINENT HISTORY: Patient was diagnosed on 05/23/2021 with right grade I invasive ductal carcinoma breast cancer.She underwent a right lumpectomy and sentinel node biopsy (5 negative nodes) on 07/12/2021. It is ER/PR positive and HER2 negative with a Ki67 of 10%.   PRECAUTIONS: right UE Lymphedema risk, None  SUBJECTIVE: Pt returns for her 3 month L-Dex screen.   PAIN:  Are you having pain? No  SOZO SCREENING: Patient was assessed today using the SOZO machine to determine the lymphedema index score. This was compared to her baseline score. It was determined that she is within the recommended range when compared to her baseline and no further action is needed at this time. She will continue SOZO screenings. These are done every 3 months for 2 years post operatively followed by every 6 months for 2 years, and then annually.   L-DEX FLOWSHEETS - 05/20/22 1000       L-DEX LYMPHEDEMA SCREENING   Measurement Type Unilateral    L-DEX MEASUREMENT  EXTREMITY Upper Extremity    POSITION  Standing    DOMINANT SIDE Right    At Risk Side Right    BASELINE SCORE (UNILATERAL) -1.7    L-DEX SCORE (UNILATERAL) -3.8    VALUE CHANGE (UNILAT) -2.1              Otelia Limes, PTA 05/20/2022, 10:28 AM

## 2022-05-28 ENCOUNTER — Encounter: Payer: Self-pay | Admitting: Adult Health

## 2022-05-28 ENCOUNTER — Inpatient Hospital Stay: Payer: Medicare HMO | Attending: Hematology

## 2022-05-28 ENCOUNTER — Other Ambulatory Visit: Payer: Self-pay

## 2022-05-28 ENCOUNTER — Other Ambulatory Visit: Payer: Self-pay | Admitting: Cardiology

## 2022-05-28 ENCOUNTER — Inpatient Hospital Stay: Payer: Medicare HMO | Admitting: Adult Health

## 2022-05-28 VITALS — BP 122/77 | HR 52 | Temp 97.8°F | Resp 14 | Ht 65.0 in | Wt 122.6 lb

## 2022-05-28 DIAGNOSIS — Z79811 Long term (current) use of aromatase inhibitors: Secondary | ICD-10-CM | POA: Insufficient documentation

## 2022-05-28 DIAGNOSIS — Z170 Estrogen receptor positive status [ER+]: Secondary | ICD-10-CM | POA: Insufficient documentation

## 2022-05-28 DIAGNOSIS — C50411 Malignant neoplasm of upper-outer quadrant of right female breast: Secondary | ICD-10-CM | POA: Diagnosis not present

## 2022-05-28 DIAGNOSIS — E785 Hyperlipidemia, unspecified: Secondary | ICD-10-CM

## 2022-05-28 DIAGNOSIS — I2510 Atherosclerotic heart disease of native coronary artery without angina pectoris: Secondary | ICD-10-CM

## 2022-05-28 LAB — CBC WITH DIFFERENTIAL/PLATELET
Abs Immature Granulocytes: 0.01 10*3/uL (ref 0.00–0.07)
Basophils Absolute: 0 10*3/uL (ref 0.0–0.1)
Basophils Relative: 0 %
Eosinophils Absolute: 0.1 10*3/uL (ref 0.0–0.5)
Eosinophils Relative: 1 %
HCT: 31.9 % — ABNORMAL LOW (ref 36.0–46.0)
Hemoglobin: 10.5 g/dL — ABNORMAL LOW (ref 12.0–15.0)
Immature Granulocytes: 0 %
Lymphocytes Relative: 14 %
Lymphs Abs: 0.8 10*3/uL (ref 0.7–4.0)
MCH: 29.6 pg (ref 26.0–34.0)
MCHC: 32.9 g/dL (ref 30.0–36.0)
MCV: 89.9 fL (ref 80.0–100.0)
Monocytes Absolute: 0.5 10*3/uL (ref 0.1–1.0)
Monocytes Relative: 8 %
Neutro Abs: 4.1 10*3/uL (ref 1.7–7.7)
Neutrophils Relative %: 77 %
Platelets: 155 10*3/uL (ref 150–400)
RBC: 3.55 MIL/uL — ABNORMAL LOW (ref 3.87–5.11)
RDW: 14.2 % (ref 11.5–15.5)
WBC: 5.4 10*3/uL (ref 4.0–10.5)
nRBC: 0 % (ref 0.0–0.2)

## 2022-05-28 LAB — COMPREHENSIVE METABOLIC PANEL
ALT: 13 U/L (ref 0–44)
AST: 17 U/L (ref 15–41)
Albumin: 4.4 g/dL (ref 3.5–5.0)
Alkaline Phosphatase: 70 U/L (ref 38–126)
Anion gap: 6 (ref 5–15)
BUN: 22 mg/dL (ref 8–23)
CO2: 29 mmol/L (ref 22–32)
Calcium: 9.2 mg/dL (ref 8.9–10.3)
Chloride: 107 mmol/L (ref 98–111)
Creatinine, Ser: 0.85 mg/dL (ref 0.44–1.00)
GFR, Estimated: >60 mL/min (ref >60)
Glucose, Bld: 96 mg/dL (ref 70–99)
Potassium: 4.3 mmol/L (ref 3.5–5.1)
Sodium: 142 mmol/L (ref 135–145)
Total Bilirubin: 0.4 mg/dL (ref 0.3–1.2)
Total Protein: 7 g/dL (ref 6.5–8.1)

## 2022-05-28 NOTE — Progress Notes (Signed)
[facility] up:    Tiffany Walsh, L.Marlou Sa, [address] [place] [address]   DIAGNOSIS:  Cancer Staging  Malignant neoplasm of upper-outer quadrant of right breast in female, estrogen receptor positive ([place]) Staging form: Breast, AJCC 8th Edition - Clinical stage from 06/20/2021: Stage IA (cT1b, cN0, cM0, G1, ER+, PR+, HER2-) - Signed by Chauncey Cruel, MD on 06/20/2021 Stage prefix: Initial diagnosis Histologic grading system: 3 grade system Laterality: Right Staged by: Pathologist and managing physician Stage used in treatment planning: Yes National guidelines used in treatment planning: Yes Type of national guideline used in treatment planning: NCCN   SUMMARY OF ONCOLOGIC HISTORY: Oncology History  Malignant neoplasm of upper-outer quadrant of right breast in female, estrogen receptor positive ([place])  06/15/2021 Initial Diagnosis   Malignant neoplasm of upper-outer quadrant of right breast in female, estrogen receptor positive ([place])   06/20/2021 Cancer Staging   Staging form: Breast, AJCC 8th Edition - Clinical stage from 06/20/2021: Stage IA (cT1b, cN0, cM0, G1, ER+, PR+, HER2-) - Signed by Chauncey Cruel, MD on 06/20/2021 Stage prefix: Initial diagnosis Histologic grading system: 3 grade system Laterality: Right Staged by: Pathologist and managing physician Stage used in treatment planning: Yes National guidelines used in treatment planning: Yes Type of national guideline used in treatment planning: NCCN   07/09/2021 Genetic Testing   [facility] CustomNext Panel was negative. No pathogenic variants were identified in the 47 genes analyzed. A variant of uncertain significance was detected in the MSH6 gene. Report date is 07/09/2021.  The CustomNext-Cancer+RNAinsight panel offered by [facility] includes sequencing and rearrangement analysis for the following 47 genes:  APC, ATM, AXIN2, BARD1, BMPR1A, BRCA1, BRCA2, BRIP1, CDH1,  CDK4, CDKN2A, CHEK2, DICER1, EPCAM, GREM1, HOXB13, MEN1, MLH1, MSH2, MSH3, MSH6, MUTYH, NBN, NF1, NF2, NTHL1, PALB2, PMS2, POLD1, POLE, PTEN, RAD51C, RAD51D, RECQL, RET, SDHA, SDHAF2, SDHB, SDHC, SDHD, SMAD4, SMARCA4, STK11, TP53, TSC1, TSC2, and VHL.  RNA data is routinely analyzed for use in variant interpretation for all genes.   07/12/2021 Surgery   right lumpectomy and sentinel lymph node sampling 07/12/2021 showed a pT1c pN0, stage IA invasive ductal carcinoma, grade 1, with negative margins.             (a) a total of 5 right axillary lymph nodes were removed      07/12/2021 Oncotype testing   Oncotype score of 16 predicts a risk of recurrence outside the breast within the next 9 years of 4% if the patient's only systemic therapy is antiestrogens for 5 years.  It also predicts no significant benefit from chemotherapy.   08/22/2021 - 09/12/2021 Radiation Therapy   08/22/2021 through 09/12/2021 Site Technique Total Dose (Gy) Dose per Fx (Gy) Completed Fx Beam Energies  Breast, Right: Breast_Rt 3D 42.56/42.56 2.66 16/16 6X, 10X    09/2021 -  Anti-estrogen oral therapy   Tamoxifen daily--d/c after 6 weeks due to vaginal discharge Changed to Anastrozole 11/17/2021     CURRENT THERAPY: Anastrozole   INTERVAL HISTORY: Tiffany Walsh 66 y.o. female returns for f/u of her estrogen positive breast cancer.  She is taking anastrozole daily with tolerable hot flashes.  She denies any vaginal dryness or arthralgias.    Her mammogram is due in 05/2022 and she has these completed at [facility].  She believes it is scheduled.  Her most recent DEXA was completed on 11/28/2021 and was normal.  She is working part time and exercises by walking daily.  She  saw Dr. Ninfa Linden in 03/2022 who will see her annually.     Patient Active Problem List   Diagnosis Date Noted   Genetic testing 06/27/2021   Family history of breast cancer 06/20/2021   Family history of colon cancer 06/20/2021   Malignant neoplasm of  upper-outer quadrant of right breast in female, estrogen receptor positive ([place]) 06/15/2021   Hypokalemia    SBO (small bowel obstruction) ([place]) 05/25/2019   Essential hypertension 05/25/2019    has No Known Allergies.  MEDICAL HISTORY: Past Medical History:  Diagnosis Date   Abdominal pain, LLQ    Anemia    Arthritis of right knee    Cervical dysplasia    CIN I (cervical intraepithelial neoplasia I)    CIN II (cervical intraepithelial neoplasia II)    Colon cancer (HCC)    Epigastric pain    GERD (gastroesophageal reflux disease)    Heme positive stool    History of rectal cancer    Hx SBO    Hypercholesterolemia    Hypertension    Malignant neoplasm of rectum, rectosigmoid junction, and anus    Nausea with vomiting, unspecified    Peripheral neuropathy due to chemotherapy (HCC)    Rectal cancer (HCC)    stage 3   Vaginal delivery 1601,0932    SURGICAL HISTORY: Past Surgical History:  Procedure Laterality Date   BREAST LUMPECTOMY WITH RADIOACTIVE SEED AND SENTINEL LYMPH NODE BIOPSY Right 07/12/2021   Procedure: RIGHT BREAST LUMPECTOMY WITH RADIOACTIVE SEED AND SENTINEL LYMPH NODE BIOPSY;  Surgeon: Coralie Keens, MD;  Location: [facility];  Service: General;  Laterality: Right;   CERVICAL CONIZATION W/BX N/A 04/04/2015   Procedure: CONIZATION CERVIX WITH BIOPSY;  Surgeon: Thurnell Lose, MD;  Location: [facility];  Service: Gynecology;  Laterality: N/A;   COLON SURGERY     MOUTH SURGERY     widom teeth       SOCIAL HISTORY: Social History   Socioeconomic History   Marital status: Married    Spouse name: Not on file   Number of children: Not on file   Years of education: Not on file   Highest education level: Not on file  Occupational History   Not on file  Tobacco Use   Smoking status: Former    Types: Cigarettes   Smokeless tobacco: Never  Vaping Use   Vaping Use: Never used  Substance and Sexual Activity   Alcohol use: Yes    Comment:  occasional    Drug use: No   Sexual activity: Not on file  Other Topics Concern   Not on file  Social History Narrative   Not on file   Social Determinants of Health   Financial Resource Strain: Low Risk  (06/20/2021)   Overall Financial Resource Strain (CARDIA)    Difficulty of Paying Living Expenses: Not very hard  Food Insecurity: No Food Insecurity (06/20/2021)   Hunger Vital Sign    Worried About Running Out of Food in the Last Year: Never true    Ran Out of Food in the Last Year: Never true  Transportation Needs: No Transportation Needs (06/20/2021)   PRAPARE - Hydrologist (Medical): No    Lack of Transportation (Non-Medical): No  Physical Activity: Not on file  Stress: Not on file  Social Connections: Not on file  Intimate Partner Violence: Not on file    FAMILY HISTORY: Family History  Problem Relation Age of Onset   Cancer Maternal Aunt  unknown type   Breast cancer Maternal Aunt    Colon cancer Maternal Uncle    Lung cancer Maternal Uncle    Cancer Maternal Grandmother        maybe stomach?    Review of Systems  Constitutional:  Negative for appetite change, chills, fatigue, fever and unexpected weight change.  HENT:   Negative for hearing loss, lump/mass and trouble swallowing.   Eyes:  Negative for eye problems and icterus.  Respiratory:  Negative for chest tightness, cough and shortness of breath.   Cardiovascular:  Negative for chest pain, leg swelling and palpitations.  Gastrointestinal:  Negative for abdominal distention, abdominal pain, constipation, diarrhea, nausea and vomiting.  Endocrine: Positive for hot flashes.  Genitourinary:  Negative for difficulty urinating.   Musculoskeletal:  Negative for arthralgias.  Skin:  Negative for itching and rash.  Neurological:  Negative for dizziness, extremity weakness, headaches and numbness.  Hematological:  Negative for adenopathy. Does not bruise/bleed easily.   Psychiatric/Behavioral:  Negative for depression. The patient is not nervous/anxious.       PHYSICAL EXAMINATION  ECOG PERFORMANCE STATUS: 1 - Symptomatic but completely ambulatory  Vitals:   05/28/22 1150  BP: 122/77  Pulse: (!) 52  Resp: 14  Temp: 97.8 F (36.6 C)  SpO2: 100%    Physical Exam Constitutional:      General: She is not in acute distress.    Appearance: Normal appearance. She is not toxic-appearing.  HENT:     Head: Normocephalic and atraumatic.  Eyes:     General: No scleral icterus. Cardiovascular:     Rate and Rhythm: Normal rate and regular rhythm.     Pulses: Normal pulses.     Heart sounds: Normal heart sounds.  Pulmonary:     Effort: Pulmonary effort is normal.     Breath sounds: Normal breath sounds.  Abdominal:     General: Abdomen is flat. Bowel sounds are normal. There is no distension.     Palpations: Abdomen is soft.     Tenderness: There is no abdominal tenderness.  Musculoskeletal:        General: No swelling.     Cervical back: Neck supple.  Lymphadenopathy:     Cervical: No cervical adenopathy.  Skin:    General: Skin is warm and dry.     Findings: No rash.  Neurological:     General: No focal deficit present.     Mental Status: She is alert.  Psychiatric:        Mood and Affect: Mood normal.        Behavior: Behavior normal.     LABORATORY DATA:  CBC    Component Value Date/Time   WBC 5.4 05/28/2022 1052   RBC 3.55 (L) 05/28/2022 1052   HGB 10.5 (L) 05/28/2022 1052   HGB 10.9 (L) 06/20/2021 0831   HGB 9.1 (L) 05/09/2015 0923   HCT 31.9 (L) 05/28/2022 1052   HCT 29.0 (L) 05/09/2015 0923   PLT 155 05/28/2022 1052   PLT 149 (L) 06/20/2021 0831   PLT 194 05/09/2015 0923   MCV 89.9 05/28/2022 1052   MCV 79.1 (L) 05/09/2015 0923   MCH 29.6 05/28/2022 1052   MCHC 32.9 05/28/2022 1052   RDW 14.2 05/28/2022 1052   RDW 17.1 (H) 05/09/2015 0923   LYMPHSABS 0.8 05/28/2022 1052   LYMPHSABS 1.0 05/09/2015 0923   MONOABS  0.5 05/28/2022 1052   MONOABS 0.3 05/09/2015 0923   EOSABS 0.1 05/28/2022 1052   EOSABS 0.1  05/09/2015 0923   BASOSABS 0.0 05/28/2022 1052   BASOSABS 0.0 05/09/2015 0923    CMP     Component Value Date/Time   NA 142 05/28/2022 1052   NA 144 05/26/2020 0939   NA 144 05/09/2015 0923   K 4.3 05/28/2022 1052   K 3.9 05/09/2015 0923   CL 107 05/28/2022 1052   CL 101 10/27/2012 0913   CO2 29 05/28/2022 1052   CO2 26 05/09/2015 0923   GLUCOSE 96 05/28/2022 1052   GLUCOSE 92 05/09/2015 0923   GLUCOSE 83 10/27/2012 0913   BUN 22 05/28/2022 1052   BUN 11 05/26/2020 0939   BUN 12.6 05/09/2015 0923   CREATININE 0.85 05/28/2022 1052   CREATININE 0.80 06/20/2021 0831   CREATININE 0.8 05/09/2015 0923   CALCIUM 9.2 05/28/2022 1052   CALCIUM 8.5 05/09/2015 0923   PROT 7.0 05/28/2022 1052   PROT 6.9 05/09/2015 0923   ALBUMIN 4.4 05/28/2022 1052   ALBUMIN 3.7 05/09/2015 0923   AST 17 05/28/2022 1052   AST 19 06/20/2021 0831   AST 13 05/09/2015 0923   ALT 13 05/28/2022 1052   ALT 15 06/20/2021 0831   ALT 10 05/09/2015 0923   ALKPHOS 70 05/28/2022 1052   ALKPHOS 76 05/09/2015 0923   BILITOT 0.4 05/28/2022 1052   BILITOT 0.4 06/20/2021 0831   BILITOT 0.27 05/09/2015 0923   GFRNONAA >60 05/28/2022 1052   GFRNONAA >60 06/20/2021 0831   GFRAA >60 06/06/2020 2141       ASSESSMENT and THERAPY PLAN:   Malignant neoplasm of upper-outer quadrant of right breast in female, estrogen receptor positive ([place]) Lamia is a 66 year old woman with stage IA right breast ER+/PR+ invasive ductal carcinoma diagnosed in 05/2021 s/p lumpectomy, adjuvant radiation therapy, and antiestrogen therapy with Anastrozole daily.   Haylyn has no clinical or radiographic sign of breast cancer recurrence.  She is tolerating Anastrozole well and will continue this.   She is due for mammogram this month and we will make sure this gets scheduled with [facility].  Her most recent bone density testing in 11/2021 was normal  and I recommended repeat in 11/2023.    She is doing well keeping up with healthy diet, exercise, and staying up to date with her cancer screening and preventative health.    Since she is seeing Dr. Ninfa Linden 03/2023, we will see her back 09/2022 for follow up.     All questions were answered. The patient knows to call the clinic with any problems, questions or concerns. We can certainly see the patient much sooner if necessary.  Total encounter time:30 minutes*in face-to-face visit time, chart review, lab review, care coordination, order entry, and documentation of the encounter time.  Wilber Bihari, NP 05/28/22 7:54 PM Medical Oncology and Hematology [facility] [place], [place] [address] Tel. [phone]    Fax. [phone]  *Total Encounter Time as defined by the Centers for Medicare and Medicaid Services includes, in addition to the face-to-face time of a patient visit (documented in the note above) non-face-to-face time: obtaining and reviewing outside history, ordering and reviewing medications, tests or procedures, care coordination (communications with other health care professionals or caregivers) and documentation in the medical record.

## 2022-05-28 NOTE — Progress Notes (Signed)
MM orders faxed to [facility]. Fax confirmation received.

## 2022-05-28 NOTE — Assessment & Plan Note (Signed)
Tiffany Walsh is a 66 year old woman with stage IA right breast ER+/PR+ invasive ductal carcinoma diagnosed in 05/2021 s/p lumpectomy, adjuvant radiation therapy, and antiestrogen therapy with Anastrozole daily.   Tiffany Walsh has no clinical or radiographic sign of breast cancer recurrence.  She is tolerating Anastrozole well and will continue this.   She is due for mammogram this month and we will make sure this gets scheduled with [facility].  Her most recent bone density testing in 11/2021 was normal and I recommended repeat in 11/2023.    She is doing well keeping up with healthy diet, exercise, and staying up to date with her cancer screening and preventative health.    Since she is seeing Dr. Ninfa Linden 03/2023, we will see her back 09/2022 for follow up.

## 2022-05-29 ENCOUNTER — Other Ambulatory Visit: Payer: Self-pay

## 2022-05-29 ENCOUNTER — Telehealth: Payer: Self-pay | Admitting: Adult Health

## 2022-05-29 ENCOUNTER — Other Ambulatory Visit: Payer: Self-pay | Admitting: Hematology

## 2022-05-29 DIAGNOSIS — I2510 Atherosclerotic heart disease of native coronary artery without angina pectoris: Secondary | ICD-10-CM

## 2022-05-29 DIAGNOSIS — E785 Hyperlipidemia, unspecified: Secondary | ICD-10-CM

## 2022-05-29 MED ORDER — ROSUVASTATIN CALCIUM 20 MG PO TABS: ORAL_TABLET | ORAL | 0 refills | Status: DC

## 2022-05-29 NOTE — Telephone Encounter (Signed)
Scheduled appointment per 9/5 los. Patient is aware.

## 2022-05-29 NOTE — Telephone Encounter (Signed)
Pt's medication was sent to pt's pharmacy as requested. Confirmation received.  °

## 2022-06-05 ENCOUNTER — Encounter: Payer: Self-pay | Admitting: Hematology

## 2022-06-05 DIAGNOSIS — Z853 Personal history of malignant neoplasm of breast: Secondary | ICD-10-CM | POA: Diagnosis not present

## 2022-06-17 ENCOUNTER — Encounter: Payer: Self-pay | Admitting: Cardiology

## 2022-06-17 ENCOUNTER — Ambulatory Visit: Payer: Medicare HMO | Attending: Cardiology | Admitting: Cardiology

## 2022-06-17 VITALS — BP 110/70 | HR 66 | Ht 65.0 in | Wt 123.0 lb

## 2022-06-17 DIAGNOSIS — I2510 Atherosclerotic heart disease of native coronary artery without angina pectoris: Secondary | ICD-10-CM | POA: Diagnosis not present

## 2022-06-17 DIAGNOSIS — E785 Hyperlipidemia, unspecified: Secondary | ICD-10-CM

## 2022-06-17 DIAGNOSIS — I10 Essential (primary) hypertension: Secondary | ICD-10-CM | POA: Diagnosis not present

## 2022-06-17 NOTE — Patient Instructions (Signed)
Medication Instructions:  The current medical regimen is effective;  continue present plan and medications.  *If you need a refill on your cardiac medications before your next appointment, please call your pharmacy*  Follow-Up: At [facility], you and your health needs are our priority.  As part of our continuing mission to provide you with exceptional heart care, we have created designated Provider Care Teams.  These Care Teams include your primary Cardiologist (physician) and Advanced Practice Providers (APPs -  Physician Assistants and Nurse Practitioners) who all work together to provide you with the care you need, when you need it.  We recommend signing up for the patient portal called "MyChart".  Sign up information is provided on this After Visit Summary.  MyChart is used to connect with patients for Virtual Visits (Telemedicine).  Patients are able to view lab/test results, encounter notes, upcoming appointments, etc.  Non-urgent messages can be sent to your provider as well.   To learn more about what you can do with MyChart, go to [email or web address].    Your next appointment:   1 year(s)  The format for your next appointment:   In Person  Provider:   Dr Mark Skains  Important Information About Sugar       

## 2022-06-17 NOTE — Progress Notes (Signed)
Cardiology Office Note:    Date:  06/17/2022   ID:  [name], DOB 03/16/1956, MRN [id number]  PCP:  Aurea Graff.Marlou Sa, MD  [place] Cardiologist:  Candee Furbish, MD  [place] Electrophysiologist:  None   Referring MD: Alroy Dust, Carlean Jews.Marlou Sa, MD    History of Present Illness:    Tiffany Walsh is a 66 y.o. female here for for follow up CAD mild non obstructive on CT coronary.   [name] husband  Chest discomfort was prior noted with intermediate pain. Doing much better now.   Dull pain, comes ang go. Lasts a minutes. Can be at rest. On treadmill ok. Lifts mother wheelchair.   Quit tob 20 years.   Today doing very well.  Denies any chest pain fevers chills nausea vomiting syncope bleeding.  Compliant with her medications.  No new changes.  Past Medical History:  Diagnosis Date   Abdominal pain, LLQ    Anemia    Arthritis of right knee    Cervical dysplasia    CIN I (cervical intraepithelial neoplasia I)    CIN II (cervical intraepithelial neoplasia II)    Colon cancer (HCC)    Epigastric pain    GERD (gastroesophageal reflux disease)    Heme positive stool    History of rectal cancer    Hx SBO    Hypercholesterolemia    Hypertension    Malignant neoplasm of rectum, rectosigmoid junction, and anus    Nausea with vomiting, unspecified    Peripheral neuropathy due to chemotherapy [facility])    Rectal cancer (HCC)    stage 3   Vaginal delivery [id number]    Past Surgical History:  Procedure Laterality Date   BREAST LUMPECTOMY WITH RADIOACTIVE SEED AND SENTINEL LYMPH NODE BIOPSY Right 07/12/2021   Procedure: RIGHT BREAST LUMPECTOMY WITH RADIOACTIVE SEED AND SENTINEL LYMPH NODE BIOPSY;  Surgeon: Coralie Keens, MD;  Location: [facility];  Service: General;  Laterality: Right;   CERVICAL CONIZATION W/BX N/A 04/04/2015   Procedure: CONIZATION CERVIX WITH BIOPSY;  Surgeon: Thurnell Lose, MD;  Location: [facility];  Service: Gynecology;  Laterality: N/A;    COLON SURGERY     MOUTH SURGERY     widom teeth       Current Medications: Current Meds  Medication Sig   acetaminophen (TYLENOL) 325 MG tablet Take 650 mg by mouth as needed for mild pain or headache.   anastrozole (ARIMIDEX) 1 MG tablet TAKE 1 TABLET BY MOUTH EVERY DAY   aspirin EC 81 MG tablet Take 1 tablet (81 mg total) by mouth every other day. Swallow whole.   cholecalciferol (VITAMIN D3) 25 MCG (1000 UNIT) tablet Take 1,000 Units by mouth daily.   esomeprazole (NEXIUM) 40 MG capsule Take 40 mg by mouth 2 (two) times daily before a meal.   famotidine (PEPCID) 20 MG tablet Take 20 mg by mouth 2 (two) times daily.   ferrous sulfate 325 (65 FE) MG EC tablet Take 1 tablet (325 mg total) by mouth every Monday, Wednesday, and Friday. Take one tablet twice a day. (Patient taking differently: Take 325 mg by mouth. Every M-W-Th-F)   metoprolol succinate (TOPROL-XL) 50 MG 24 hr tablet Take 50 mg by mouth daily.   rosuvastatin (CRESTOR) 20 MG tablet TAKE 1 TABLET BY MOUTH ONCE DAILY .   verapamil (VERELAN PM) 360 MG 24 hr capsule Take 360 mg by mouth daily.     Allergies:   Patient has no known allergies.   Social History  Socioeconomic History   Marital status: Married    Spouse name: Not on file   Number of children: Not on file   Years of education: Not on file   Highest education level: Not on file  Occupational History   Not on file  Tobacco Use   Smoking status: Former    Types: Cigarettes   Smokeless tobacco: Never  Vaping Use   Vaping Use: Never used  Substance and Sexual Activity   Alcohol use: Yes    Comment: occasional    Drug use: No   Sexual activity: Not on file  Other Topics Concern   Not on file  Social History Narrative   Not on file   Social Determinants of Health   Financial Resource Strain: Low Risk  (06/20/2021)   Overall Financial Resource Strain (CARDIA)    Difficulty of Paying Living Expenses: Not very hard  Food Insecurity: No Food Insecurity  (06/20/2021)   Hunger Vital Sign    Worried About Running Out of Food in the Last Year: Never true    Ran Out of Food in the Last Year: Never true  Transportation Needs: No Transportation Needs (06/20/2021)   PRAPARE - Hydrologist (Medical): No    Lack of Transportation (Non-Medical): No  Physical Activity: Not on file  Stress: Not on file  Social Connections: Not on file     Family History: The patient's family history includes Breast cancer in her maternal aunt; Cancer in her maternal aunt and maternal grandmother; Colon cancer in her maternal uncle; Lung cancer in her maternal uncle.  Mother did not have heart disease.  ROS:   Please see the history of present illness.    Denies any fevers chills nausea vomiting syncope bleeding.  Occasional heartburn noted.  All other systems reviewed and are negative.  EKGs/Labs/Other Studies Reviewed:    The following studies were reviewed today: EKG reviewed, outside office notes reviewed, outside labs reviewed  Cardiac CT 05/31/20:  IMPRESSION: 1. Coronary calcium score of 114. This was 53 percentile for age and sex matched control. (Calcified plaque in proximal LAD)   2. Normal coronary origin with right dominance.   3. There is a focal dense calcified proximal LAD plaque, 0-24% stenosis, non flow limiting.   4. Very small area of aortic root calcification/ atherosclerosis.   5. CAD-RADS 1. Minimal non-obstructive CAD (0-24%). Consider non-atherosclerotic causes of chest pain. Consider preventive therapy and risk factor modification  EKG: Today sinus rhythm 66 subtle nonspecific T wave changes.  Prior SB 57 no changes.   Recent Labs: 05/28/2022: ALT 13; BUN 22; Creatinine, Ser 0.85; Hemoglobin 10.5; Platelets 155; Potassium 4.3; Sodium 142  Recent Lipid Panel    Component Value Date/Time   CHOL 163 09/06/2020 1109   TRIG 85 09/06/2020 1109   HDL 102 09/06/2020 1109   CHOLHDL 1.6 09/06/2020 1109    LDLCALC 46 09/06/2020 1109    Physical Exam:    VS:  BP 110/70 (BP Location: Left Arm, Patient Position: Sitting, Cuff Size: Normal)   Pulse 66   Ht '5\' 5"'$  (1.651 m)   Wt 123 lb (55.8 kg)   BMI 20.47 kg/m     Wt Readings from Last 3 Encounters:  06/17/22 123 lb (55.8 kg)  05/28/22 122 lb 9.6 oz (55.6 kg)  05/20/22 120 lb 4 oz (54.5 kg)    GEN: Well nourished, well developed, in no acute distress HEENT: normal Neck: no JVD, carotid bruits, or  masses Cardiac: RRR; no murmurs, rubs, or gallops,no edema  Respiratory:  clear to auscultation bilaterally, normal work of breathing GI: soft, nontender, nondistended, + BS MS: no deformity or atrophy Skin: warm and dry, no rash Neuro:  Alert and Oriented x 3, Strength and sensation are intact Psych: euthymic mood, full affect    ASSESSMENT:    1. Coronary artery calcification seen on CT scan   2. Essential hypertension   3. Hyperlipidemia, unspecified hyperlipidemia type      PLAN:    In order of problems listed above:  Non obstructive mild CAD from CT of heart with prior chest discomfort  -Former smoker, quit 20 years ago.  No diabetes, no early family history of coronary artery disease. Coronary CT scan reviewed-- recommend 81 mg of aspirin, statin etc.QOD asa seems reasonable. Watch for any signs of bleeding.  --Crestor '20mg'$  PO QD - LDL 47. No myalgias. Doing well. -Continue with current goal-directed medical therapy.  Discussed plaque stabilization.  Asymptomatic.  Once again reviewed CT.  Essential hypertension -Has been on both metoprolol 50 ER and verapamil 240 ER.  Well-controlled.  Creat 0.85  Hyperlipidemia LDL now 46 on Crestor 20 mg.  Medication Adjustments/Labs and Tests Ordered: Current medicines are reviewed at length with the patient today.  Concerns regarding medicines are outlined above.  Orders Placed This Encounter  Procedures   EKG 12-Lead    No orders of the defined types were placed in this  encounter.    Patient Instructions  Medication Instructions:  The current medical regimen is effective;  continue present plan and medications.  *If you need a refill on your cardiac medications before your next appointment, please call your pharmacy*  Follow-Up: At [facility], you and your health needs are our priority.  As part of our continuing mission to provide you with exceptional heart care, we have created designated Provider Care Teams.  These Care Teams include your primary Cardiologist (physician) and Advanced Practice Providers (APPs -  Physician Assistants and Nurse Practitioners) who all work together to provide you with the care you need, when you need it.  We recommend signing up for the patient portal called "MyChart".  Sign up information is provided on this After Visit Summary.  MyChart is used to connect with patients for Virtual Visits (Telemedicine).  Patients are able to view lab/test results, encounter notes, upcoming appointments, etc.  Non-urgent messages can be sent to your provider as well.   To learn more about what you can do with MyChart, go to [email or web address].    Your next appointment:   1 year(s)  The format for your next appointment:   In Person  Provider:   Dr Candee Furbish     Important Information About Sugar         Signed, Candee Furbish, MD  06/17/2022 8:47 AM    [place]

## 2022-06-19 ENCOUNTER — Other Ambulatory Visit: Payer: Self-pay | Admitting: Cardiology

## 2022-06-19 DIAGNOSIS — E785 Hyperlipidemia, unspecified: Secondary | ICD-10-CM

## 2022-06-19 DIAGNOSIS — I2510 Atherosclerotic heart disease of native coronary artery without angina pectoris: Secondary | ICD-10-CM

## 2022-07-01 DIAGNOSIS — Z85048 Personal history of other malignant neoplasm of rectum, rectosigmoid junction, and anus: Secondary | ICD-10-CM | POA: Diagnosis not present

## 2022-07-01 DIAGNOSIS — K219 Gastro-esophageal reflux disease without esophagitis: Secondary | ICD-10-CM | POA: Diagnosis not present

## 2022-07-01 DIAGNOSIS — I10 Essential (primary) hypertension: Secondary | ICD-10-CM | POA: Diagnosis not present

## 2022-07-01 DIAGNOSIS — K5900 Constipation, unspecified: Secondary | ICD-10-CM | POA: Diagnosis not present

## 2022-07-01 DIAGNOSIS — R059 Cough, unspecified: Secondary | ICD-10-CM | POA: Diagnosis not present

## 2022-07-17 DIAGNOSIS — I10 Essential (primary) hypertension: Secondary | ICD-10-CM | POA: Diagnosis not present

## 2022-07-17 DIAGNOSIS — G9331 Postviral fatigue syndrome: Secondary | ICD-10-CM | POA: Diagnosis not present

## 2022-07-23 DIAGNOSIS — H2513 Age-related nuclear cataract, bilateral: Secondary | ICD-10-CM | POA: Diagnosis not present

## 2022-07-23 DIAGNOSIS — H524 Presbyopia: Secondary | ICD-10-CM | POA: Diagnosis not present

## 2022-07-23 DIAGNOSIS — H52223 Regular astigmatism, bilateral: Secondary | ICD-10-CM | POA: Diagnosis not present

## 2022-07-23 DIAGNOSIS — H5203 Hypermetropia, bilateral: Secondary | ICD-10-CM | POA: Diagnosis not present

## 2022-08-07 DIAGNOSIS — K644 Residual hemorrhoidal skin tags: Secondary | ICD-10-CM | POA: Diagnosis not present

## 2022-08-07 DIAGNOSIS — Z980 Intestinal bypass and anastomosis status: Secondary | ICD-10-CM | POA: Diagnosis not present

## 2022-08-07 DIAGNOSIS — Z08 Encounter for follow-up examination after completed treatment for malignant neoplasm: Secondary | ICD-10-CM | POA: Diagnosis not present

## 2022-08-07 DIAGNOSIS — Z85048 Personal history of other malignant neoplasm of rectum, rectosigmoid junction, and anus: Secondary | ICD-10-CM | POA: Diagnosis not present

## 2022-08-07 DIAGNOSIS — D125 Benign neoplasm of sigmoid colon: Secondary | ICD-10-CM | POA: Diagnosis not present

## 2022-08-09 DIAGNOSIS — D125 Benign neoplasm of sigmoid colon: Secondary | ICD-10-CM | POA: Diagnosis not present

## 2022-08-17 ENCOUNTER — Other Ambulatory Visit: Payer: Self-pay | Admitting: Hematology

## 2022-09-02 ENCOUNTER — Ambulatory Visit: Payer: Medicare HMO | Attending: Surgery

## 2022-09-02 VITALS — Wt 119.0 lb

## 2022-09-02 DIAGNOSIS — Z483 Aftercare following surgery for neoplasm: Secondary | ICD-10-CM | POA: Insufficient documentation

## 2022-09-02 NOTE — Therapy (Signed)
OUTPATIENT PHYSICAL THERAPY SOZO SCREENING NOTE   Patient Name: Tiffany Walsh MRN: [id number] DOB:Apr 30, 1956, 66 y.o., female Today's Date: 09/02/2022  PCP: Alroy Dust, L.Marlou Sa, MD REFERRING PROVIDER: Coralie Keens, MD   PT End of Session - 09/02/22 1053     Visit Number 6   #  unchanged due to screen only   PT Start Time 7034    PT Stop Time 1056    PT Time Calculation (min) 4 min    Activity Tolerance Patient tolerated treatment well    Behavior During Therapy WFL for tasks assessed/performed             Past Medical History:  Diagnosis Date   Abdominal pain, LLQ    Anemia    Arthritis of right knee    Cervical dysplasia    CIN I (cervical intraepithelial neoplasia I)    CIN II (cervical intraepithelial neoplasia II)    Colon cancer (HCC)    Epigastric pain    GERD (gastroesophageal reflux disease)    Heme positive stool    History of rectal cancer    Hx SBO    Hypercholesterolemia    Hypertension    Malignant neoplasm of rectum, rectosigmoid junction, and anus    Nausea with vomiting, unspecified    Peripheral neuropathy due to chemotherapy ([place])    Rectal cancer ([place])    stage 3   Vaginal delivery 0352,4818   Past Surgical History:  Procedure Laterality Date   BREAST LUMPECTOMY WITH RADIOACTIVE SEED AND SENTINEL LYMPH NODE BIOPSY Right 07/12/2021   Procedure: RIGHT BREAST LUMPECTOMY WITH RADIOACTIVE SEED AND SENTINEL LYMPH NODE BIOPSY;  Surgeon: Coralie Keens, MD;  Location: [place];  Service: General;  Laterality: Right;   CERVICAL CONIZATION W/BX N/A 04/04/2015   Procedure: CONIZATION CERVIX WITH BIOPSY;  Surgeon: Thurnell Lose, MD;  Location: [facility];  Service: Gynecology;  Laterality: N/A;   COLON SURGERY     MOUTH SURGERY     widom teeth      Patient Active Problem List   Diagnosis Date Noted   Genetic testing 06/27/2021   Family history of breast cancer 06/20/2021   Family history of colon cancer 06/20/2021   Malignant  neoplasm of upper-outer quadrant of right breast in female, estrogen receptor positive ([place]) 06/15/2021   Hypokalemia    SBO (small bowel obstruction) ([place]) 05/25/2019   Essential hypertension 05/25/2019    REFERRING DIAG: right breast cancer at risk for lymphedema  THERAPY DIAG: Aftercare following surgery for neoplasm  PERTINENT HISTORY: Patient was diagnosed on 05/23/2021 with right grade I invasive ductal carcinoma breast cancer.She underwent a right lumpectomy and sentinel node biopsy (5 negative nodes) on 07/12/2021. It is ER/PR positive and HER2 negative with a Ki67 of 10%.   PRECAUTIONS: right UE Lymphedema risk, None  SUBJECTIVE: Pt returns for her 3 month L-Dex screen.   PAIN:  Are you having pain? No  SOZO SCREENING: Patient was assessed today using the SOZO machine to determine the lymphedema index score. This was compared to her baseline score. It was determined that she is within the recommended range when compared to her baseline and no further action is needed at this time. She will continue SOZO screenings. These are done every 3 months for 2 years post operatively followed by every 6 months for 2 years, and then annually.   L-DEX FLOWSHEETS - 09/02/22 1000       L-DEX LYMPHEDEMA SCREENING   Measurement Type Unilateral    L-DEX  MEASUREMENT EXTREMITY Upper Extremity    POSITION  Standing    DOMINANT SIDE Right    At Risk Side Right    BASELINE SCORE (UNILATERAL) -1.7    L-DEX SCORE (UNILATERAL) -3    VALUE CHANGE (UNILAT) -1.3              Tiffany Walsh, PTA 09/02/2022, 10:54 AM

## 2022-09-17 ENCOUNTER — Other Ambulatory Visit: Payer: Self-pay | Admitting: Hematology

## 2022-09-29 NOTE — Assessment & Plan Note (Signed)
stage IA p(T1c, N0), ER+/PR+/Her2-, Grade 1, RS 16 -found on screening mammogram. Biopsy on 06/11/21 showed invasive ductal carcinoma, low grade. -right lumpectomy on 07/12/21 by Dr. Ninfa Linden. Pathology showed 1.5 cm invasive and in situ ductal carcinoma. Margins and lymph nodes negative. -she received radiation 11/20-12/21/22 under Dr. Isidore Moos -she started tamoxifen on 09/23/21

## 2022-09-30 ENCOUNTER — Encounter: Payer: Self-pay | Admitting: Hematology

## 2022-09-30 ENCOUNTER — Inpatient Hospital Stay: Payer: Medicare HMO | Attending: Hematology

## 2022-09-30 ENCOUNTER — Other Ambulatory Visit: Payer: Self-pay

## 2022-09-30 ENCOUNTER — Inpatient Hospital Stay (HOSPITAL_BASED_OUTPATIENT_CLINIC_OR_DEPARTMENT_OTHER): Payer: Medicare HMO | Admitting: Hematology

## 2022-09-30 VITALS — BP 133/76 | HR 57 | Temp 98.6°F | Resp 18 | Ht 65.0 in | Wt 120.3 lb

## 2022-09-30 DIAGNOSIS — Z170 Estrogen receptor positive status [ER+]: Secondary | ICD-10-CM | POA: Diagnosis not present

## 2022-09-30 DIAGNOSIS — C50411 Malignant neoplasm of upper-outer quadrant of right female breast: Secondary | ICD-10-CM | POA: Insufficient documentation

## 2022-09-30 DIAGNOSIS — D649 Anemia, unspecified: Secondary | ICD-10-CM | POA: Insufficient documentation

## 2022-09-30 DIAGNOSIS — Z79811 Long term (current) use of aromatase inhibitors: Secondary | ICD-10-CM | POA: Insufficient documentation

## 2022-09-30 LAB — CBC WITH DIFFERENTIAL/PLATELET
Abs Immature Granulocytes: 0.02 10*3/uL (ref 0.00–0.07)
Basophils Absolute: 0 10*3/uL (ref 0.0–0.1)
Basophils Relative: 0 %
Eosinophils Absolute: 0.1 10*3/uL (ref 0.0–0.5)
Eosinophils Relative: 2 %
HCT: 33.2 % — ABNORMAL LOW (ref 36.0–46.0)
Hemoglobin: 10.9 g/dL — ABNORMAL LOW (ref 12.0–15.0)
Immature Granulocytes: 0 %
Lymphocytes Relative: 9 %
Lymphs Abs: 0.5 10*3/uL — ABNORMAL LOW (ref 0.7–4.0)
MCH: 27.7 pg (ref 26.0–34.0)
MCHC: 32.8 g/dL (ref 30.0–36.0)
MCV: 84.3 fL (ref 80.0–100.0)
Monocytes Absolute: 0.6 10*3/uL (ref 0.1–1.0)
Monocytes Relative: 11 %
Neutro Abs: 4.6 10*3/uL (ref 1.7–7.7)
Neutrophils Relative %: 78 %
Platelets: 198 10*3/uL (ref 150–400)
RBC: 3.94 MIL/uL (ref 3.87–5.11)
RDW: 18.1 % — ABNORMAL HIGH (ref 11.5–15.5)
WBC: 5.9 10*3/uL (ref 4.0–10.5)
nRBC: 0 % (ref 0.0–0.2)

## 2022-09-30 LAB — COMPREHENSIVE METABOLIC PANEL
ALT: 11 U/L (ref 0–44)
AST: 16 U/L (ref 15–41)
Albumin: 4.1 g/dL (ref 3.5–5.0)
Alkaline Phosphatase: 77 U/L (ref 38–126)
Anion gap: 6 (ref 5–15)
BUN: 16 mg/dL (ref 8–23)
CO2: 29 mmol/L (ref 22–32)
Calcium: 9.4 mg/dL (ref 8.9–10.3)
Chloride: 105 mmol/L (ref 98–111)
Creatinine, Ser: 0.88 mg/dL (ref 0.44–1.00)
GFR, Estimated: >60 mL/min (ref >60)
Glucose, Bld: 86 mg/dL (ref 70–99)
Potassium: 4.1 mmol/L (ref 3.5–5.1)
Sodium: 140 mmol/L (ref 135–145)
Total Bilirubin: 0.5 mg/dL (ref 0.3–1.2)
Total Protein: 6.7 g/dL (ref 6.5–8.1)

## 2022-09-30 NOTE — Progress Notes (Signed)
[facility]   Telephone:(336) [phone] Fax:([phone]) [phone]   Clinic Follow up Note   Patient Care Team: Alroy Dust, L.Marlou Sa, MD as PCP - General (Family Medicine) Jerline Pain, MD as PCP - Cardiology (Cardiology) Mauro Kaufmann, RN as Oncology Nurse Navigator Rockwell Germany, RN as Oncology Nurse Navigator Coralie Keens, MD as Consulting Physician (General Surgery) Eppie Gibson, MD as Attending Physician (Radiation Oncology) Truitt Merle, MD as Attending Physician (Hematology and Oncology)  Date of Service:  09/30/2022  CHIEF COMPLAINT: f/u of  right breast cancer    CURRENT THERAPY:  Antiestrogen Therapy started 09/23/21, to start anastrozole 11/17/21.    ASSESSMENT:  Tiffany Walsh is a 67 y.o. female with   Malignant neoplasm of upper-outer quadrant of right breast in female, estrogen receptor positive ([place]) stage IA p(T1c, N0), ER+/PR+/Her2-, Grade 1, RS 16 -found on screening mammogram. Biopsy on 06/11/21 showed invasive ductal carcinoma, low grade. -right lumpectomy on 07/12/21 by Dr. Ninfa Linden. Pathology showed 1.5 cm invasive and in situ ductal carcinoma. Margins and lymph nodes negative. -she received radiation 11/20-12/21/22 under Dr. Isidore Moos -she started anastrazole in 10/2021, tolerating well so far  and was switched to anastrozole in February 2023 due to side effect, she is tolerating better with mild hot flashes. -Breast cancer surveillance.  PLAN -lab reviewed.  She is clinically doing well -Continue anastrozole -f/u  Tiffany Cause NP in 6 months  -Mammogram due in September 2024.  SUMMARY OF ONCOLOGIC HISTORY: Oncology History Overview Note   Cancer Staging  Malignant neoplasm of upper-outer quadrant of right breast in female, estrogen receptor positive ([place]) Staging form: Breast, AJCC 8th Edition - Clinical stage from 06/20/2021: Stage IA (cT1b, cN0, cM0, G1, ER+, PR+, HER2-) - Signed by Chauncey Cruel, MD on 06/20/2021 Stage prefix: Initial  diagnosis Histologic grading system: 3 grade system Laterality: Right Staged by: Pathologist and managing physician Stage used in treatment planning: Yes National guidelines used in treatment planning: Yes Type of national guideline used in treatment planning: NCCN     Malignant neoplasm of upper-outer quadrant of right breast in female, estrogen receptor positive ([place])  06/15/2021 Initial Diagnosis   Malignant neoplasm of upper-outer quadrant of right breast in female, estrogen receptor positive ([place])   06/20/2021 Cancer Staging   Staging form: Breast, AJCC 8th Edition - Clinical stage from 06/20/2021: Stage IA (cT1b, cN0, cM0, G1, ER+, PR+, HER2-) - Signed by Chauncey Cruel, MD on 06/20/2021 Stage prefix: Initial diagnosis Histologic grading system: 3 grade system Laterality: Right Staged by: Pathologist and managing physician Stage used in treatment planning: Yes National guidelines used in treatment planning: Yes Type of national guideline used in treatment planning: NCCN   07/09/2021 Genetic Testing   [facility] CustomNext Panel was negative. No pathogenic variants were identified in the 47 genes analyzed. A variant of uncertain significance was detected in the MSH6 gene. Report date is 07/09/2021.  The CustomNext-Cancer+RNAinsight panel offered by [facility] includes sequencing and rearrangement analysis for the following 47 genes:  APC, ATM, AXIN2, BARD1, BMPR1A, BRCA1, BRCA2, BRIP1, CDH1, CDK4, CDKN2A, CHEK2, DICER1, EPCAM, GREM1, HOXB13, MEN1, MLH1, MSH2, MSH3, MSH6, MUTYH, NBN, NF1, NF2, NTHL1, PALB2, PMS2, POLD1, POLE, PTEN, RAD51C, RAD51D, RECQL, RET, SDHA, SDHAF2, SDHB, SDHC, SDHD, SMAD4, SMARCA4, STK11, TP53, TSC1, TSC2, and VHL.  RNA data is routinely analyzed for use in variant interpretation for all genes.   07/12/2021 Surgery   right lumpectomy and sentinel lymph node sampling 07/12/2021 showed a pT1c pN0, stage IA invasive  ductal carcinoma, grade 1, with negative  margins.             (a) a total of 5 right axillary lymph nodes were removed      07/12/2021 Oncotype testing   Oncotype score of 16 predicts a risk of recurrence outside the breast within the next 9 years of 4% if the patient's only systemic therapy is antiestrogens for 5 years.  It also predicts no significant benefit from chemotherapy.   08/22/2021 - 09/12/2021 Radiation Therapy   08/22/2021 through 09/12/2021 Site Technique Total Dose (Gy) Dose per Fx (Gy) Completed Fx Beam Energies  Breast, Right: Breast_Rt 3D 42.56/42.56 2.66 16/16 6X, 10X    09/2021 -  Anti-estrogen oral therapy   Tamoxifen daily--d/c after 6 weeks due to vaginal discharge Changed to Anastrozole 11/17/2021      INTERVAL HISTORY:  ANJELICA GORNIAK is here for a follow up of right breast cancer  She was last seen by me  on 11/16/21 She presents to the clinic alone Pt has no concerns. Overall doing well. Py states she is taking Iron Pill 4 x a week for anemia.   All other systems were reviewed with the patient and are negative.  MEDICAL HISTORY:  Past Medical History:  Diagnosis Date   Abdominal pain, LLQ    Anemia    Arthritis of right knee    Cervical dysplasia    CIN I (cervical intraepithelial neoplasia I)    CIN II (cervical intraepithelial neoplasia II)    Colon cancer (HCC)    Epigastric pain    GERD (gastroesophageal reflux disease)    Heme positive stool    History of rectal cancer    Hx SBO    Hypercholesterolemia    Hypertension    Malignant neoplasm of rectum, rectosigmoid junction, and anus    Nausea with vomiting, unspecified    Peripheral neuropathy due to chemotherapy (HCC)    Rectal cancer (HCC)    stage 3   Vaginal delivery 2778,2423    SURGICAL HISTORY: Past Surgical History:  Procedure Laterality Date   BREAST LUMPECTOMY WITH RADIOACTIVE SEED AND SENTINEL LYMPH NODE BIOPSY Right 07/12/2021   Procedure: RIGHT BREAST LUMPECTOMY WITH RADIOACTIVE SEED AND SENTINEL LYMPH NODE  BIOPSY;  Surgeon: Coralie Keens, MD;  Location: [place];  Service: General;  Laterality: Right;   CERVICAL CONIZATION W/BX N/A 04/04/2015   Procedure: CONIZATION CERVIX WITH BIOPSY;  Surgeon: Thurnell Lose, MD;  Location: [facility];  Service: Gynecology;  Laterality: N/A;   COLON SURGERY     MOUTH SURGERY     widom teeth       I have reviewed the social history and family history with the patient and they are unchanged from previous note.  ALLERGIES:  has No Known Allergies.  MEDICATIONS:  Current Outpatient Medications  Medication Sig Dispense Refill   acetaminophen (TYLENOL) 325 MG tablet Take 650 mg by mouth as needed for mild pain or headache.     anastrozole (ARIMIDEX) 1 MG tablet TAKE 1 TABLET BY MOUTH EVERY DAY 30 tablet 0   aspirin EC 81 MG tablet Take 1 tablet (81 mg total) by mouth every other day. Swallow whole. 90 tablet 3   cholecalciferol (VITAMIN D3) 25 MCG (1000 UNIT) tablet Take 1,000 Units by mouth daily.     esomeprazole (NEXIUM) 40 MG capsule Take 40 mg by mouth 2 (two) times daily before a meal.     famotidine (PEPCID) 20 MG tablet Take 20  mg by mouth 2 (two) times daily.     ferrous sulfate 325 (65 FE) MG EC tablet Take 1 tablet (325 mg total) by mouth every Monday, Wednesday, and Friday. Take one tablet twice a day. (Patient taking differently: Take 325 mg by mouth. Every M-W-Th-F) 30 tablet 0   metoprolol succinate (TOPROL-XL) 50 MG 24 hr tablet Take 50 mg by mouth daily.     rosuvastatin (CRESTOR) 20 MG tablet Take 1 tablet by mouth once daily 90 tablet 3   verapamil (VERELAN PM) 360 MG 24 hr capsule Take 360 mg by mouth daily.     No current facility-administered medications for this visit.    PHYSICAL EXAMINATION: ECOG PERFORMANCE STATUS: 0 - Asymptomatic  Vitals:   09/30/22 1134  BP: 133/76  Pulse: (!) 57  Resp: 18  Temp: 98.6 F (37 C)  SpO2: 96%   Wt Readings from Last 3 Encounters:  09/30/22 120 lb 4.8 oz (54.6 kg)  09/02/22  119 lb (54 kg)  06/17/22 123 lb (55.8 kg)    GENERAL:alert, no distress and comfortable SKIN: skin color, texture, turgor are normal, no rashes or significant lesions EYES: normal, Conjunctiva are pink and non-injected, sclera clear NECK: supple, thyroid normal size, non-tender, without nodularity LYMPH: (-)  no palpable lymphadenopathy in the cervical, axillary  LUNGS: clear to auscultation and percussion with normal breathing effort HEART: regular rate & rhythm and no murmurs and no lower extremity edema ABDOMEN:abdomen soft, non-tender and normal bowel sounds Musculoskeletal:no cyanosis of digits and no clubbing  NEURO: alert & oriented x 3 with fluent speech, no focal motor/sensory deficits BREAST: RT breast mild lymphoedema, No palpable mass, Breast exam benign    LABORATORY DATA:  I have reviewed the data as listed    Latest Ref Rng & Units 09/30/2022   10:30 AM 05/28/2022   10:52 AM 11/16/2021    4:09 PM  CBC  WBC 4.0 - 10.5 K/uL 5.9  5.4  4.4   Hemoglobin 12.0 - 15.0 g/dL 10.9  10.5  11.1   Hematocrit 36.0 - 46.0 % 33.2  31.9  35.1   Platelets 150 - 400 K/uL 198  155  150         Latest Ref Rng & Units 09/30/2022   10:30 AM 05/28/2022   10:52 AM 11/16/2021    4:09 PM  CMP  Glucose 70 - 99 mg/dL 86  96  83   BUN 8 - 23 mg/dL '16  22  17   '$ Creatinine 0.44 - 1.00 mg/dL 0.88  0.85  0.86   Sodium 135 - 145 mmol/L 140  142  141   Potassium 3.5 - 5.1 mmol/L 4.1  4.3  3.5   Chloride 98 - 111 mmol/L 105  107  106   CO2 22 - 32 mmol/L '29  29  28   '$ Calcium 8.9 - 10.3 mg/dL 9.4  9.2  9.2   Total Protein 6.5 - 8.1 g/dL 6.7  7.0  7.5   Total Bilirubin 0.3 - 1.2 mg/dL 0.5  0.4  0.3   Alkaline Phos 38 - 126 U/L 77  70  72   AST 15 - 41 U/L '16  17  19   '$ ALT 0 - 44 U/L '11  13  14       '$ RADIOGRAPHIC STUDIES: I have personally reviewed the radiological images as listed and agreed with the findings in the report. No results found.    Orders Placed This Encounter  Procedures  Ferritin    Standing Status:   Future    Standing Expiration Date:   10/01/2023   Vitamin B12    Standing Status:   Future    Standing Expiration Date:   10/01/2023   Retic Panel    Standing Status:   Future    Standing Expiration Date:   10/01/2023   All questions were answered. The patient knows to call the clinic with any problems, questions or concerns. No barriers to learning was detected. The total time spent in the appointment was 20 minutes.     Truitt Merle, MD 09/30/2022   Felicity Coyer, CMA, am acting as scribe for Truitt Merle, MD.   I have reviewed the above documentation for accuracy and completeness, and I agree with the above.

## 2022-10-12 ENCOUNTER — Other Ambulatory Visit: Payer: Self-pay | Admitting: Hematology

## 2022-11-20 DIAGNOSIS — Z0000 Encounter for general adult medical examination without abnormal findings: Secondary | ICD-10-CM | POA: Diagnosis not present

## 2022-11-20 DIAGNOSIS — N871 Moderate cervical dysplasia: Secondary | ICD-10-CM | POA: Diagnosis not present

## 2022-11-20 DIAGNOSIS — Z85048 Personal history of other malignant neoplasm of rectum, rectosigmoid junction, and anus: Secondary | ICD-10-CM | POA: Diagnosis not present

## 2022-11-20 DIAGNOSIS — I10 Essential (primary) hypertension: Secondary | ICD-10-CM | POA: Diagnosis not present

## 2022-11-20 DIAGNOSIS — D649 Anemia, unspecified: Secondary | ICD-10-CM | POA: Diagnosis not present

## 2022-11-20 DIAGNOSIS — C50411 Malignant neoplasm of upper-outer quadrant of right female breast: Secondary | ICD-10-CM | POA: Diagnosis not present

## 2022-11-20 DIAGNOSIS — R32 Unspecified urinary incontinence: Secondary | ICD-10-CM | POA: Diagnosis not present

## 2022-11-20 DIAGNOSIS — E7800 Pure hypercholesterolemia, unspecified: Secondary | ICD-10-CM | POA: Diagnosis not present

## 2022-11-20 DIAGNOSIS — G620 Drug-induced polyneuropathy: Secondary | ICD-10-CM | POA: Diagnosis not present

## 2022-11-20 DIAGNOSIS — K219 Gastro-esophageal reflux disease without esophagitis: Secondary | ICD-10-CM | POA: Diagnosis not present

## 2022-11-20 DIAGNOSIS — E559 Vitamin D deficiency, unspecified: Secondary | ICD-10-CM | POA: Diagnosis not present

## 2022-11-20 DIAGNOSIS — K5900 Constipation, unspecified: Secondary | ICD-10-CM | POA: Diagnosis not present

## 2022-12-16 ENCOUNTER — Ambulatory Visit: Payer: Medicare HMO | Attending: Surgery

## 2022-12-16 VITALS — Wt 116.0 lb

## 2022-12-16 DIAGNOSIS — Z483 Aftercare following surgery for neoplasm: Secondary | ICD-10-CM | POA: Insufficient documentation

## 2022-12-16 NOTE — Therapy (Signed)
OUTPATIENT PHYSICAL THERAPY SOZO SCREENING NOTE   Patient Name: Tiffany Walsh MRN: [id number] DOB:28-Apr-1956, 67 y.o., female Today's Date: 12/16/2022  PCP: Alroy Dust, L.Marlou Sa, MD REFERRING PROVIDER: Coralie Keens, MD   PT End of Session - 12/16/22 1040     Visit Number 6   # unchanged due to screen only   PT Start Time 1038    PT Stop Time 1042    PT Time Calculation (min) 4 min    Activity Tolerance Patient tolerated treatment well    Behavior During Therapy WFL for tasks assessed/performed             Past Medical History:  Diagnosis Date   Abdominal pain, LLQ    Anemia    Arthritis of right knee    Cervical dysplasia    CIN I (cervical intraepithelial neoplasia I)    CIN II (cervical intraepithelial neoplasia II)    Colon cancer (HCC)    Epigastric pain    GERD (gastroesophageal reflux disease)    Heme positive stool    History of rectal cancer    Hx SBO    Hypercholesterolemia    Hypertension    Malignant neoplasm of rectum, rectosigmoid junction, and anus    Nausea with vomiting, unspecified    Peripheral neuropathy due to chemotherapy ([place])    Rectal cancer ([place])    stage 3   Vaginal delivery [id number]   Past Surgical History:  Procedure Laterality Date   BREAST LUMPECTOMY WITH RADIOACTIVE SEED AND SENTINEL LYMPH NODE BIOPSY Right 07/12/2021   Procedure: RIGHT BREAST LUMPECTOMY WITH RADIOACTIVE SEED AND SENTINEL LYMPH NODE BIOPSY;  Surgeon: Coralie Keens, MD;  Location: [facility];  Service: General;  Laterality: Right;   CERVICAL CONIZATION W/BX N/A 04/04/2015   Procedure: CONIZATION CERVIX WITH BIOPSY;  Surgeon: Thurnell Lose, MD;  Location: [facility];  Service: Gynecology;  Laterality: N/A;   COLON SURGERY     MOUTH SURGERY     widom teeth      Patient Active Problem List   Diagnosis Date Noted   Genetic testing 06/27/2021   Family history of breast cancer 06/20/2021   Family history of colon cancer 06/20/2021   Malignant  neoplasm of upper-outer quadrant of right breast in female, estrogen receptor positive ([place]) 06/15/2021   Hypokalemia    SBO (small bowel obstruction) ([place]) 05/25/2019   Essential hypertension 05/25/2019    REFERRING DIAG: right breast cancer at risk for lymphedema  THERAPY DIAG: Aftercare following surgery for neoplasm  PERTINENT HISTORY: Patient was diagnosed on 05/23/2021 with right grade I invasive ductal carcinoma breast cancer.She underwent a right lumpectomy and sentinel node biopsy (5 negative nodes) on 07/12/2021. It is ER/PR positive and HER2 negative with a Ki67 of 10%.   PRECAUTIONS: right UE Lymphedema risk, None  SUBJECTIVE: Pt returns for her 3 month L-Dex screen.   PAIN:  Are you having pain? No  SOZO SCREENING: Patient was assessed today using the SOZO machine to determine the lymphedema index score. This was compared to her baseline score. It was determined that she is within the recommended range when compared to her baseline and no further action is needed at this time. She will continue SOZO screenings. These are done every 3 months for 2 years post operatively followed by every 6 months for 2 years, and then annually.   L-DEX FLOWSHEETS - 12/16/22 1000       L-DEX LYMPHEDEMA SCREENING   Measurement Type Unilateral    L-DEX MEASUREMENT  EXTREMITY Upper Extremity    POSITION  Standing    DOMINANT SIDE Right    At Risk Side Right    BASELINE SCORE (UNILATERAL) -1.7    L-DEX SCORE (UNILATERAL) -4.3    VALUE CHANGE (UNILAT) -2.6              Otelia Limes, PTA 12/16/2022, 10:41 AM

## 2022-12-23 DIAGNOSIS — R319 Hematuria, unspecified: Secondary | ICD-10-CM | POA: Diagnosis not present

## 2023-01-07 ENCOUNTER — Ambulatory Visit: Payer: Medicare HMO | Admitting: Physical Therapy

## 2023-02-28 ENCOUNTER — Encounter: Payer: Self-pay | Admitting: Genetic Counselor

## 2023-02-28 NOTE — Progress Notes (Signed)
UPDATE: MSH6 p.T1100M VUS has been amended to Likely Benign. Amended report date is 09/12/2022. 

## 2023-03-17 ENCOUNTER — Ambulatory Visit: Payer: Medicare HMO | Attending: Surgery

## 2023-03-17 VITALS — Wt 114.5 lb

## 2023-03-17 DIAGNOSIS — Z483 Aftercare following surgery for neoplasm: Secondary | ICD-10-CM | POA: Insufficient documentation

## 2023-03-17 NOTE — Therapy (Signed)
OUTPATIENT PHYSICAL THERAPY SOZO SCREENING NOTE   Patient Name: Tiffany Walsh MRN: [id number] DOB:[birth year], 67 y.o., female Today's Date: 03/17/2023  PCP: Clovis Riley, L.[name], MD REFERRING PROVIDER: Abigail Miyamoto, MD   PT End of Session - 03/17/23 1058     Visit Number 6   # unchanged due to screen only   PT Start Time 1056    PT Stop Time 1059    PT Time Calculation (min) 3 min    Activity Tolerance Patient tolerated treatment well    Behavior During Therapy WFL for tasks assessed/performed             Past Medical History:  Diagnosis Date   Abdominal pain, LLQ    Anemia    Arthritis of right knee    Cervical dysplasia    CIN I (cervical intraepithelial neoplasia I)    CIN II (cervical intraepithelial neoplasia II)    Colon cancer (HCC)    Epigastric pain    GERD (gastroesophageal reflux disease)    Heme positive stool    History of rectal cancer    Hx SBO    Hypercholesterolemia    Hypertension    Malignant neoplasm of rectum, rectosigmoid junction, and anus    Nausea with vomiting, unspecified    Peripheral neuropathy due to chemotherapy ([facility])    Rectal cancer ([facility])    stage 3   Vaginal delivery 4098,1191   Past Surgical History:  Procedure Laterality Date   BREAST LUMPECTOMY WITH RADIOACTIVE SEED AND SENTINEL LYMPH NODE BIOPSY Right 07/12/2021   Procedure: RIGHT BREAST LUMPECTOMY WITH RADIOACTIVE SEED AND SENTINEL LYMPH NODE BIOPSY;  Surgeon: Abigail Miyamoto, MD;  Location: [facility];  Service: General;  Laterality: Right;   CERVICAL CONIZATION W/BX N/A 04/04/2015   Procedure: CONIZATION CERVIX WITH BIOPSY;  Surgeon: Geryl Rankins, MD;  Location: [facility];  Service: Gynecology;  Laterality: N/A;   COLON SURGERY     MOUTH SURGERY     widom teeth      Patient Active Problem List   Diagnosis Date Noted   Genetic testing 06/27/2021   Family history of breast cancer 06/20/2021   Family history of colon cancer 06/20/2021   Malignant  neoplasm of upper-outer quadrant of right breast in female, estrogen receptor positive ([facility]) 06/15/2021   Hypokalemia    SBO (small bowel obstruction) ([facility]) 05/25/2019   Essential hypertension 05/25/2019    REFERRING DIAG: right breast cancer at risk for lymphedema  THERAPY DIAG: Aftercare following surgery for neoplasm  PERTINENT HISTORY: Patient was diagnosed on 05/23/2021 with right grade I invasive ductal carcinoma breast cancer.She underwent a right lumpectomy and sentinel node biopsy (5 negative nodes) on 07/12/2021. It is ER/PR positive and HER2 negative with a Ki67 of 10%.   PRECAUTIONS: right UE Lymphedema risk, None  SUBJECTIVE: Pt returns for her 3 month L-Dex screen.   PAIN:  Are you having pain? No  SOZO SCREENING: Patient was assessed today using the SOZO machine to determine the lymphedema index score. This was compared to her baseline score. It was determined that she is within the recommended range when compared to her baseline and no further action is needed at this time. She will continue SOZO screenings. These are done every 3 months for 2 years post operatively followed by every 6 months for 2 years, and then annually.   L-DEX FLOWSHEETS - 03/17/23 1000       L-DEX LYMPHEDEMA SCREENING   Measurement Type Unilateral    L-DEX MEASUREMENT  EXTREMITY Upper Extremity    POSITION  Standing    DOMINANT SIDE Right    At Risk Side Right    BASELINE SCORE (UNILATERAL) -1.7    L-DEX SCORE (UNILATERAL) -4.4    VALUE CHANGE (UNILAT) -2.7            P: Begin 6 month L-Dex screens next.   Hermenia Bers, PTA 03/17/2023, 10:58 AM

## 2023-03-31 ENCOUNTER — Other Ambulatory Visit: Payer: Medicare HMO

## 2023-03-31 ENCOUNTER — Ambulatory Visit: Payer: Medicare HMO | Admitting: Adult Health

## 2023-04-07 ENCOUNTER — Other Ambulatory Visit: Payer: Self-pay | Admitting: Hematology

## 2023-04-07 ENCOUNTER — Other Ambulatory Visit: Payer: Self-pay

## 2023-04-07 DIAGNOSIS — Z170 Estrogen receptor positive status [ER+]: Secondary | ICD-10-CM

## 2023-04-08 ENCOUNTER — Inpatient Hospital Stay: Payer: Medicare HMO | Attending: Hematology

## 2023-04-08 ENCOUNTER — Other Ambulatory Visit: Payer: Self-pay | Admitting: *Deleted

## 2023-04-08 ENCOUNTER — Inpatient Hospital Stay: Payer: Medicare HMO | Admitting: Adult Health

## 2023-04-08 ENCOUNTER — Other Ambulatory Visit: Payer: Self-pay

## 2023-04-08 ENCOUNTER — Encounter: Payer: Self-pay | Admitting: Adult Health

## 2023-04-08 VITALS — BP 136/82 | HR 62 | Temp 97.7°F | Resp 18 | Ht 65.0 in | Wt 117.7 lb

## 2023-04-08 DIAGNOSIS — Z79811 Long term (current) use of aromatase inhibitors: Secondary | ICD-10-CM | POA: Insufficient documentation

## 2023-04-08 DIAGNOSIS — K219 Gastro-esophageal reflux disease without esophagitis: Secondary | ICD-10-CM | POA: Insufficient documentation

## 2023-04-08 DIAGNOSIS — Z170 Estrogen receptor positive status [ER+]: Secondary | ICD-10-CM | POA: Diagnosis not present

## 2023-04-08 DIAGNOSIS — C50411 Malignant neoplasm of upper-outer quadrant of right female breast: Secondary | ICD-10-CM | POA: Diagnosis not present

## 2023-04-08 DIAGNOSIS — G620 Drug-induced polyneuropathy: Secondary | ICD-10-CM | POA: Insufficient documentation

## 2023-04-08 DIAGNOSIS — Z13820 Encounter for screening for osteoporosis: Secondary | ICD-10-CM | POA: Diagnosis not present

## 2023-04-08 DIAGNOSIS — E7800 Pure hypercholesterolemia, unspecified: Secondary | ICD-10-CM | POA: Insufficient documentation

## 2023-04-08 DIAGNOSIS — D649 Anemia, unspecified: Secondary | ICD-10-CM

## 2023-04-08 LAB — CBC WITH DIFFERENTIAL (CANCER CENTER ONLY)
Abs Immature Granulocytes: 0.01 10*3/uL (ref 0.00–0.07)
Basophils Absolute: 0 10*3/uL (ref 0.0–0.1)
Basophils Relative: 1 %
Eosinophils Absolute: 0 10*3/uL (ref 0.0–0.5)
Eosinophils Relative: 1 %
HCT: 34.6 % — ABNORMAL LOW (ref 36.0–46.0)
Hemoglobin: 11.4 g/dL — ABNORMAL LOW (ref 12.0–15.0)
Immature Granulocytes: 0 %
Lymphocytes Relative: 14 %
Lymphs Abs: 0.6 10*3/uL — ABNORMAL LOW (ref 0.7–4.0)
MCH: 30.1 pg (ref 26.0–34.0)
MCHC: 32.9 g/dL (ref 30.0–36.0)
MCV: 91.3 fL (ref 80.0–100.0)
Monocytes Absolute: 0.3 10*3/uL (ref 0.1–1.0)
Monocytes Relative: 7 %
Neutro Abs: 3.3 10*3/uL (ref 1.7–7.7)
Neutrophils Relative %: 77 %
Platelet Count: 181 10*3/uL (ref 150–400)
RBC: 3.79 MIL/uL — ABNORMAL LOW (ref 3.87–5.11)
RDW: 14.2 % (ref 11.5–15.5)
WBC Count: 4.2 10*3/uL (ref 4.0–10.5)
nRBC: 0 % (ref 0.0–0.2)

## 2023-04-08 LAB — RETIC PANEL
Immature Retic Fract: 14.9 % (ref 2.3–15.9)
RBC.: 3.75 MIL/uL — ABNORMAL LOW (ref 3.87–5.11)
Retic Count, Absolute: 49.9 10*3/uL (ref 19.0–186.0)
Retic Ct Pct: 1.3 % (ref 0.4–3.1)
Reticulocyte Hemoglobin: 33.9 pg (ref >27.9)

## 2023-04-08 LAB — CMP (CANCER CENTER ONLY)
ALT: 13 U/L (ref 0–44)
AST: 21 U/L (ref 15–41)
Albumin: 4.3 g/dL (ref 3.5–5.0)
Alkaline Phosphatase: 86 U/L (ref 38–126)
Anion gap: 7 (ref 5–15)
BUN: 17 mg/dL (ref 8–23)
CO2: 28 mmol/L (ref 22–32)
Calcium: 9.2 mg/dL (ref 8.9–10.3)
Chloride: 108 mmol/L (ref 98–111)
Creatinine: 0.77 mg/dL (ref 0.44–1.00)
GFR, Estimated: >60 mL/min (ref >60)
Glucose, Bld: 87 mg/dL (ref 70–99)
Potassium: 3.6 mmol/L (ref 3.5–5.1)
Sodium: 143 mmol/L (ref 135–145)
Total Bilirubin: 0.5 mg/dL (ref 0.3–1.2)
Total Protein: 6.9 g/dL (ref 6.5–8.1)

## 2023-04-08 LAB — VITAMIN B12: Vitamin B-12: 3947 pg/mL — ABNORMAL HIGH (ref 180–914)

## 2023-04-08 LAB — FERRITIN: Ferritin: 177 ng/mL (ref 11–307)

## 2023-04-08 NOTE — Progress Notes (Signed)
[facility] Cancer Follow up:    Tiffany Walsh, L.Tiffany Saucer, MD [address] [place] [address]   DIAGNOSIS:  Cancer Staging  Malignant neoplasm of upper-outer quadrant of right breast in female, estrogen receptor positive (HCC) Staging form: Breast, AJCC 8th Edition - Clinical stage from 06/20/2021: Stage IA (cT1b, cN0, cM0, G1, ER+, PR+, HER2-) - Signed by Lowella Dell, MD on 06/20/2021 Stage prefix: Initial diagnosis Histologic grading system: 3 grade system Laterality: Right Staged by: Pathologist and managing physician Stage used in treatment planning: Yes National guidelines used in treatment planning: Yes Type of national guideline used in treatment planning: NCCN   SUMMARY OF ONCOLOGIC HISTORY: Oncology History Overview Note   Cancer Staging  Malignant neoplasm of upper-outer quadrant of right breast in female, estrogen receptor positive (HCC) Staging form: Breast, AJCC 8th Edition - Clinical stage from 06/20/2021: Stage IA (cT1b, cN0, cM0, G1, ER+, PR+, HER2-) - Signed by Lowella Dell, MD on 06/20/2021 Stage prefix: Initial diagnosis Histologic grading system: 3 grade system Laterality: Right Staged by: Pathologist and managing physician Stage used in treatment planning: Yes National guidelines used in treatment planning: Yes Type of national guideline used in treatment planning: NCCN     Malignant neoplasm of upper-outer quadrant of right breast in female, estrogen receptor positive (HCC)  06/15/2021 Initial Diagnosis   Malignant neoplasm of upper-outer quadrant of right breast in female, estrogen receptor positive (HCC)   06/20/2021 Cancer Staging   Staging form: Breast, AJCC 8th Edition - Clinical stage from 06/20/2021: Stage IA (cT1b, cN0, cM0, G1, ER+, PR+, HER2-) - Signed by Lowella Dell, MD on 06/20/2021 Stage prefix: Initial diagnosis Histologic grading system: 3 grade system Laterality: Right Staged by: Pathologist and  managing physician Stage used in treatment planning: Yes National guidelines used in treatment planning: Yes Type of national guideline used in treatment planning: NCCN   07/09/2021 Genetic Testing   [facility] CustomNext Panel was negative. No pathogenic variants were identified in the 47 genes analyzed. A variant of uncertain significance was detected in the MSH6 gene. Report date is 07/09/2021.  The CustomNext-Cancer+RNAinsight panel offered by [employer] includes sequencing and rearrangement analysis for the following 47 genes:  APC, ATM, AXIN2, BARD1, BMPR1A, BRCA1, BRCA2, BRIP1, CDH1, CDK4, CDKN2A, CHEK2, DICER1, EPCAM, GREM1, HOXB13, MEN1, MLH1, MSH2, MSH3, MSH6, MUTYH, NBN, NF1, NF2, NTHL1, PALB2, PMS2, POLD1, POLE, PTEN, RAD51C, RAD51D, RECQL, RET, SDHA, SDHAF2, SDHB, SDHC, SDHD, SMAD4, SMARCA4, STK11, TP53, TSC1, TSC2, and VHL.  RNA data is routinely analyzed for use in variant interpretation for all genes.  UPDATE: MSH6 p.T1100M VUS has been amended to Likely Benign. Amended report date is 09/12/2022.   07/12/2021 Surgery   right lumpectomy and sentinel lymph node sampling 07/12/2021 showed a pT1c pN0, stage IA invasive ductal carcinoma, grade 1, with negative margins.             (a) a total of 5 right axillary lymph nodes were removed      07/12/2021 Oncotype testing   Oncotype score of 16 predicts a risk of recurrence outside the breast within the next 9 years of 4% if the patient's only systemic therapy is antiestrogens for 5 years.  It also predicts no significant benefit from chemotherapy.   08/22/2021 - 09/12/2021 Radiation Therapy   08/22/2021 through 09/12/2021 Site Technique Total Dose (Gy) Dose per Fx (Gy) Completed Fx Beam Energies  Breast, Right: Breast_Rt 3D 42.56/42.56 2.66 16/16 6X, 10X     09/2021 -  Anti-estrogen oral therapy   Tamoxifen daily--d/c after 6 weeks due to vaginal discharge Changed to Anastrozole 11/17/2021     CURRENT THERAPY: Anastrozole  daily  INTERVAL HISTORY: Tiffany Walsh 67 y.o. female returns for f/u of her breast cancer on treatment with Anastrozole.  She is tolerating this well.  She is exercising regularly by walking.  Her most recent mammogram occurred on 06/04/2022 and demonstrated no mammographic evidence of malignancy and breast density category B.  Her most recent bone density testing occurred on 11/28/2021 and was normal.     Patient Active Problem List   Diagnosis Date Noted   Pure hypercholesterolemia 04/08/2023   Peripheral neuropathy due to chemotherapy (HCC) 04/08/2023   Gastro-esophageal reflux disease without esophagitis 04/08/2023   Genetic testing 06/27/2021   Family history of breast cancer 06/20/2021   Family history of colon cancer 06/20/2021   Malignant neoplasm of upper-outer quadrant of right breast in female, estrogen receptor positive (HCC) 06/15/2021   Hypokalemia    SBO (small bowel obstruction) (HCC) 05/25/2019   Essential hypertension 05/25/2019    has No Known Allergies.  MEDICAL HISTORY: Past Medical History:  Diagnosis Date   Abdominal pain, LLQ    Anemia    Arthritis of right knee    Cervical dysplasia    CIN I (cervical intraepithelial neoplasia I)    CIN II (cervical intraepithelial neoplasia II)    Colon cancer (HCC)    Epigastric pain    GERD (gastroesophageal reflux disease)    Heme positive stool    History of rectal cancer    Hx SBO    Hypercholesterolemia    Hypertension    Malignant neoplasm of rectum, rectosigmoid junction, and anus    Nausea with vomiting, unspecified    Peripheral neuropathy due to chemotherapy (HCC)    Rectal cancer (HCC)    stage 3   Vaginal delivery 9937,1696    SURGICAL HISTORY: Past Surgical History:  Procedure Laterality Date   BREAST LUMPECTOMY WITH RADIOACTIVE SEED AND SENTINEL LYMPH NODE BIOPSY Right 07/12/2021   Procedure: RIGHT BREAST LUMPECTOMY WITH RADIOACTIVE SEED AND SENTINEL LYMPH NODE BIOPSY;  Surgeon: Abigail Miyamoto, MD;  Location: [facility];  Service: General;  Laterality: Right;   CERVICAL CONIZATION W/BX N/A 04/04/2015   Procedure: CONIZATION CERVIX WITH BIOPSY;  Surgeon: Geryl Rankins, MD;  Location: [facility];  Service: Gynecology;  Laterality: N/A;   COLON SURGERY     MOUTH SURGERY     widom teeth       SOCIAL HISTORY: Social History   Socioeconomic History   Marital status: Married    Spouse name: Not on file   Number of children: Not on file   Years of education: Not on file   Highest education level: Not on file  Occupational History   Not on file  Tobacco Use   Smoking status: Former    Types: Cigarettes   Smokeless tobacco: Never  Vaping Use   Vaping status: Never Used  Substance and Sexual Activity   Alcohol use: Yes    Comment: occasional    Drug use: No   Sexual activity: Not on file  Other Topics Concern   Not on file  Social History Narrative   Not on file   Social Determinants of Health   Financial Resource Strain: Low Risk  (06/20/2021)   Overall Financial Resource Strain (CARDIA)    Difficulty of Paying Living Expenses: Not very hard  Food Insecurity: No Food Insecurity (06/20/2021)  Hunger Vital Sign    Worried About Running Out of Food in the Last Year: Never true    Ran Out of Food in the Last Year: Never true  Transportation Needs: No Transportation Needs (06/20/2021)   PRAPARE - Administrator, Civil Service (Medical): No    Lack of Transportation (Non-Medical): No  Physical Activity: Not on file  Stress: Not on file  Social Connections: Not on file  Intimate Partner Violence: Not on file    FAMILY HISTORY: Family History  Problem Relation Age of Onset   Cancer Maternal Aunt        unknown type   Breast cancer Maternal Aunt    Colon cancer Maternal Uncle    Lung cancer Maternal Uncle    Cancer Maternal Grandmother        maybe stomach?    Review of Systems  Constitutional:  Negative for appetite change,  chills, fatigue, fever and unexpected weight change.  HENT:   Negative for hearing loss, lump/mass and trouble swallowing.   Eyes:  Negative for eye problems and icterus.  Respiratory:  Negative for chest tightness, cough and shortness of breath.   Cardiovascular:  Negative for chest pain, leg swelling and palpitations.  Gastrointestinal:  Negative for abdominal distention, abdominal pain, constipation, diarrhea, nausea and vomiting.  Endocrine: Negative for hot flashes.  Genitourinary:  Negative for difficulty urinating.   Musculoskeletal:  Negative for arthralgias.  Skin:  Negative for itching and rash.  Neurological:  Negative for dizziness, extremity weakness, headaches and numbness.  Hematological:  Negative for adenopathy. Does not bruise/bleed easily.  Psychiatric/Behavioral:  Negative for depression. The patient is not nervous/anxious.       PHYSICAL EXAMINATION    Vitals:   04/08/23 1458  BP: 136/82  Pulse: 62  Resp: 18  Temp: 97.7 F (36.5 C)  SpO2: 99%    Physical Exam Constitutional:      General: She is not in acute distress.    Appearance: Normal appearance. She is not toxic-appearing.  HENT:     Head: Normocephalic and atraumatic.     Mouth/Throat:     Mouth: Mucous membranes are moist.     Pharynx: Oropharynx is clear. No oropharyngeal exudate or posterior oropharyngeal erythema.  Eyes:     General: No scleral icterus. Cardiovascular:     Rate and Rhythm: Normal rate and regular rhythm.     Pulses: Normal pulses.     Heart sounds: Normal heart sounds.  Pulmonary:     Effort: Pulmonary effort is normal.     Breath sounds: Normal breath sounds.  Chest:     Comments: Right breast s/p lumpectomy and radiation, no sign of local recurrence, left breast benign Abdominal:     General: Abdomen is flat. Bowel sounds are normal. There is no distension.     Palpations: Abdomen is soft.     Tenderness: There is no abdominal tenderness.  Musculoskeletal:         General: No swelling.     Cervical back: Neck supple.  Lymphadenopathy:     Cervical: No cervical adenopathy.  Skin:    General: Skin is warm and dry.     Findings: No rash.  Neurological:     General: No focal deficit present.     Mental Status: She is alert.  Psychiatric:        Mood and Affect: Mood normal.        Behavior: Behavior normal.     LABORATORY  DATA:  CBC    Component Value Date/Time   WBC 4.2 04/08/2023 1410   WBC 5.9 09/30/2022 1030   RBC 3.79 (L) 04/08/2023 1410   RBC 3.75 (L) 04/08/2023 1410   HGB 11.4 (L) 04/08/2023 1410   HGB 9.1 (L) 05/09/2015 0923   HCT 34.6 (L) 04/08/2023 1410   HCT 29.0 (L) 05/09/2015 0923   PLT 181 04/08/2023 1410   PLT 194 05/09/2015 0923   MCV 91.3 04/08/2023 1410   MCV 79.1 (L) 05/09/2015 0923   MCH 30.1 04/08/2023 1410   MCHC 32.9 04/08/2023 1410   RDW 14.2 04/08/2023 1410   RDW 17.1 (H) 05/09/2015 0923   LYMPHSABS 0.6 (L) 04/08/2023 1410   LYMPHSABS 1.0 05/09/2015 0923   MONOABS 0.3 04/08/2023 1410   MONOABS 0.3 05/09/2015 0923   EOSABS 0.0 04/08/2023 1410   EOSABS 0.1 05/09/2015 0923   BASOSABS 0.0 04/08/2023 1410   BASOSABS 0.0 05/09/2015 0923    CMP     Component Value Date/Time   NA 143 04/08/2023 1410   NA 144 05/26/2020 0939   NA 144 05/09/2015 0923   K 3.6 04/08/2023 1410   K 3.9 05/09/2015 0923   CL 108 04/08/2023 1410   CL 101 10/27/2012 0913   CO2 28 04/08/2023 1410   CO2 26 05/09/2015 0923   GLUCOSE 87 04/08/2023 1410   GLUCOSE 92 05/09/2015 0923   GLUCOSE 83 10/27/2012 0913   BUN 17 04/08/2023 1410   BUN 11 05/26/2020 0939   BUN 12.6 05/09/2015 0923   CREATININE 0.77 04/08/2023 1410   CREATININE 0.8 05/09/2015 0923   CALCIUM 9.2 04/08/2023 1410   CALCIUM 8.5 05/09/2015 0923   PROT 6.9 04/08/2023 1410   PROT 6.9 05/09/2015 0923   ALBUMIN 4.3 04/08/2023 1410   ALBUMIN 3.7 05/09/2015 0923   AST 21 04/08/2023 1410   AST 13 05/09/2015 0923   ALT 13 04/08/2023 1410   ALT 10 05/09/2015  0923   ALKPHOS 86 04/08/2023 1410   ALKPHOS 76 05/09/2015 0923   BILITOT 0.5 04/08/2023 1410   BILITOT 0.27 05/09/2015 0923   GFRNONAA >60 04/08/2023 1410   GFRAA >60 06/06/2020 2141      ASSESSMENT and THERAPY PLAN:   Malignant neoplasm of upper-outer quadrant of right breast in female, estrogen receptor positive (HCC) Tiffany Walsh is a 67 year old woman with stage Ia ER/PR positive right breast invasive ductal carcinoma diagnosed in September 2022 status post right lumpectomy followed by adjuvant radiation and antiestrogen with anastrozole which began in January 2023.  Stage Ia right-sided breast cancer: She has no clinical or radiographic signs of breast cancer recurrence.  She will continue on anastrozole daily since she is tolerating this well.  Her next mammogram is due in September 2024 at [facility]. Bone health: Her most recent bone density testing in March 2023 was normal.  Since she is taking an aromatase inhibitor she is recommended to undergo repeat testing every 2 years.  This will be due in March 2025. Health maintenance: I recommended continued follow-up with her primary care provider, and continued healthy diet and exercise as she has been doing.  Tiffany Walsh will return in 6 months for labs and follow-up with Dr. Mosetta Putt.  She knows to call for any questions or concerns that may arise between now and her next visit with [place].   All questions were answered. The patient knows to call the clinic with any problems, questions or concerns. We can certainly see the patient much sooner if necessary.  Total encounter time:20 minutes*in face-to-face visit time, chart review, lab review, care coordination, order entry, and documentation of the encounter time.   Lillard Anes, NP 04/08/23 3:28 PM Medical Oncology and Hematology [facility] [address] [place], [place] [address] Tel. [phone]    Fax. [phone]  *Total Encounter Time as defined by the Centers for Medicare  and Medicaid Services includes, in addition to the face-to-face time of a patient visit (documented in the note above) non-face-to-face time: obtaining and reviewing outside history, ordering and reviewing medications, tests or procedures, care coordination (communications with other health care professionals or caregivers) and documentation in the medical record.

## 2023-04-08 NOTE — Assessment & Plan Note (Signed)
Tiffany Walsh is a 67 year old woman with stage Ia ER/PR positive right breast invasive ductal carcinoma diagnosed in September 2022 status post right lumpectomy followed by adjuvant radiation and antiestrogen with anastrozole which began in January 2023.  Stage Ia right-sided breast cancer: She has no clinical or radiographic signs of breast cancer recurrence.  She will continue on anastrozole daily since she is tolerating this well.  Her next mammogram is due in September 2024 at [facility]. Bone health: Her most recent bone density testing in March 2023 was normal.  Since she is taking an aromatase inhibitor she is recommended to undergo repeat testing every 2 years.  This will be due in March 2025. Health maintenance: I recommended continued follow-up with her primary care provider, and continued healthy diet and exercise as she has been doing.  [name] will return in 6 months for labs and follow-up with Dr. Mosetta Putt.  She knows to call for any questions or concerns that may arise between now and her next visit with [place].

## 2023-04-09 ENCOUNTER — Other Ambulatory Visit: Payer: Self-pay

## 2023-04-10 ENCOUNTER — Other Ambulatory Visit: Payer: Self-pay

## 2023-04-16 ENCOUNTER — Encounter: Payer: Self-pay | Admitting: Genetic Counselor

## 2023-04-16 DIAGNOSIS — Z853 Personal history of malignant neoplasm of breast: Secondary | ICD-10-CM | POA: Diagnosis not present

## 2023-04-16 DIAGNOSIS — Z8742 Personal history of other diseases of the female genital tract: Secondary | ICD-10-CM | POA: Diagnosis not present

## 2023-04-16 DIAGNOSIS — Z85048 Personal history of other malignant neoplasm of rectum, rectosigmoid junction, and anus: Secondary | ICD-10-CM | POA: Diagnosis not present

## 2023-04-16 DIAGNOSIS — Z124 Encounter for screening for malignant neoplasm of cervix: Secondary | ICD-10-CM | POA: Diagnosis not present

## 2023-04-16 DIAGNOSIS — Z1151 Encounter for screening for human papillomavirus (HPV): Secondary | ICD-10-CM | POA: Diagnosis not present

## 2023-04-16 DIAGNOSIS — Z01419 Encounter for gynecological examination (general) (routine) without abnormal findings: Secondary | ICD-10-CM | POA: Diagnosis not present

## 2023-05-09 DIAGNOSIS — C50919 Malignant neoplasm of unspecified site of unspecified female breast: Secondary | ICD-10-CM | POA: Diagnosis not present

## 2023-05-09 DIAGNOSIS — L259 Unspecified contact dermatitis, unspecified cause: Secondary | ICD-10-CM | POA: Diagnosis not present

## 2023-05-23 DIAGNOSIS — E7800 Pure hypercholesterolemia, unspecified: Secondary | ICD-10-CM | POA: Diagnosis not present

## 2023-05-23 DIAGNOSIS — D649 Anemia, unspecified: Secondary | ICD-10-CM | POA: Diagnosis not present

## 2023-05-23 DIAGNOSIS — C50411 Malignant neoplasm of upper-outer quadrant of right female breast: Secondary | ICD-10-CM | POA: Diagnosis not present

## 2023-05-23 DIAGNOSIS — L259 Unspecified contact dermatitis, unspecified cause: Secondary | ICD-10-CM | POA: Diagnosis not present

## 2023-05-23 DIAGNOSIS — I10 Essential (primary) hypertension: Secondary | ICD-10-CM | POA: Diagnosis not present

## 2023-05-23 DIAGNOSIS — K219 Gastro-esophageal reflux disease without esophagitis: Secondary | ICD-10-CM | POA: Diagnosis not present

## 2023-05-23 DIAGNOSIS — E538 Deficiency of other specified B group vitamins: Secondary | ICD-10-CM | POA: Diagnosis not present

## 2023-06-10 DIAGNOSIS — Z853 Personal history of malignant neoplasm of breast: Secondary | ICD-10-CM | POA: Diagnosis not present

## 2023-06-10 DIAGNOSIS — R92323 Mammographic fibroglandular density, bilateral breasts: Secondary | ICD-10-CM | POA: Diagnosis not present

## 2023-06-11 ENCOUNTER — Encounter: Payer: Self-pay | Admitting: Hematology

## 2023-06-13 DIAGNOSIS — L821 Other seborrheic keratosis: Secondary | ICD-10-CM | POA: Diagnosis not present

## 2023-06-13 DIAGNOSIS — D492 Neoplasm of unspecified behavior of bone, soft tissue, and skin: Secondary | ICD-10-CM | POA: Diagnosis not present

## 2023-06-17 ENCOUNTER — Other Ambulatory Visit: Payer: Self-pay | Admitting: Cardiology

## 2023-06-17 DIAGNOSIS — K2940 Chronic atrophic gastritis without bleeding: Secondary | ICD-10-CM | POA: Diagnosis not present

## 2023-06-17 DIAGNOSIS — K31A19 Gastric intestinal metaplasia without dysplasia, unspecified site: Secondary | ICD-10-CM | POA: Diagnosis not present

## 2023-06-17 DIAGNOSIS — K2970 Gastritis, unspecified, without bleeding: Secondary | ICD-10-CM | POA: Diagnosis not present

## 2023-06-17 DIAGNOSIS — E785 Hyperlipidemia, unspecified: Secondary | ICD-10-CM

## 2023-06-17 DIAGNOSIS — D509 Iron deficiency anemia, unspecified: Secondary | ICD-10-CM | POA: Diagnosis not present

## 2023-06-17 DIAGNOSIS — I2510 Atherosclerotic heart disease of native coronary artery without angina pectoris: Secondary | ICD-10-CM

## 2023-06-17 DIAGNOSIS — K209 Esophagitis, unspecified without bleeding: Secondary | ICD-10-CM | POA: Diagnosis not present

## 2023-06-17 DIAGNOSIS — K219 Gastro-esophageal reflux disease without esophagitis: Secondary | ICD-10-CM | POA: Diagnosis not present

## 2023-06-17 DIAGNOSIS — K2930 Chronic superficial gastritis without bleeding: Secondary | ICD-10-CM | POA: Diagnosis not present

## 2023-06-18 ENCOUNTER — Ambulatory Visit: Payer: Medicare HMO | Attending: Surgery

## 2023-06-18 VITALS — Wt 116.4 lb

## 2023-06-18 DIAGNOSIS — Z483 Aftercare following surgery for neoplasm: Secondary | ICD-10-CM | POA: Insufficient documentation

## 2023-06-18 NOTE — Therapy (Signed)
OUTPATIENT PHYSICAL THERAPY SOZO SCREENING NOTE   Patient Name: Tiffany Walsh MRN: [id number] DOB:12-31-1955, 67 y.o., female Today's Date: 06/18/2023  PCP: Clovis Riley, L.[name], MD REFERRING PROVIDER: Abigail Miyamoto, MD   PT End of Session - 06/18/23 1536     Visit Number 6   # unchanged due to screen only   PT Start Time 1040    PT Stop Time 1043    PT Time Calculation (min) 3 min    Activity Tolerance Patient tolerated treatment well    Behavior During Therapy WFL for tasks assessed/performed             Past Medical History:  Diagnosis Date   Abdominal pain, LLQ    Anemia    Arthritis of right knee    Cervical dysplasia    CIN I (cervical intraepithelial neoplasia I)    CIN II (cervical intraepithelial neoplasia II)    Colon cancer (HCC)    Epigastric pain    GERD (gastroesophageal reflux disease)    Heme positive stool    History of rectal cancer    Hx SBO    Hypercholesterolemia    Hypertension    Malignant neoplasm of rectum, rectosigmoid junction, and anus    Nausea with vomiting, unspecified    Peripheral neuropathy due to chemotherapy (HCC)    Rectal cancer (HCC)    stage 3   Vaginal delivery 7829,5621   Past Surgical History:  Procedure Laterality Date   BREAST LUMPECTOMY WITH RADIOACTIVE SEED AND SENTINEL LYMPH NODE BIOPSY Right 07/12/2021   Procedure: RIGHT BREAST LUMPECTOMY WITH RADIOACTIVE SEED AND SENTINEL LYMPH NODE BIOPSY;  Surgeon: Abigail Miyamoto, MD;  Location: [facility];  Service: General;  Laterality: Right;   CERVICAL CONIZATION W/BX N/A 04/04/2015   Procedure: CONIZATION CERVIX WITH BIOPSY;  Surgeon: Geryl Rankins, MD;  Location: [facility];  Service: Gynecology;  Laterality: N/A;   COLON SURGERY     MOUTH SURGERY     widom teeth      Patient Active Problem List   Diagnosis Date Noted   Pure hypercholesterolemia 04/08/2023   Peripheral neuropathy due to chemotherapy (HCC) 04/08/2023   Gastro-esophageal reflux  disease without esophagitis 04/08/2023   Genetic testing 06/27/2021   Family history of breast cancer 06/20/2021   Family history of colon cancer 06/20/2021   Malignant neoplasm of upper-outer quadrant of right breast in female, estrogen receptor positive (HCC) 06/15/2021   Hypokalemia    SBO (small bowel obstruction) (HCC) 05/25/2019   Essential hypertension 05/25/2019    REFERRING DIAG: right breast cancer at risk for lymphedema  THERAPY DIAG: Aftercare following surgery for neoplasm  PERTINENT HISTORY: Patient was diagnosed on 05/23/2021 with right grade I invasive ductal carcinoma breast cancer.She underwent a right lumpectomy and sentinel node biopsy (5 negative nodes) on 07/12/2021. It is ER/PR positive and HER2 negative with a Ki67 of 10%.   PRECAUTIONS: right UE Lymphedema risk, None  SUBJECTIVE: Pt returns for her 3 month L-Dex screen.   PAIN:  Are you having pain? No  SOZO SCREENING: Patient was assessed today using the SOZO machine to determine the lymphedema index score. This was compared to her baseline score. It was determined that she is within the recommended range when compared to her baseline and no further action is needed at this time. She will continue SOZO screenings. These are done every 3 months for 2 years post operatively followed by every 6 months for 2 years, and then annually.   L-DEX FLOWSHEETS -  06/18/23 1500       L-DEX LYMPHEDEMA SCREENING   Measurement Type Unilateral    L-DEX MEASUREMENT EXTREMITY Upper Extremity    POSITION  Standing    DOMINANT SIDE Right    At Risk Side Right    BASELINE SCORE (UNILATERAL) -1.7    L-DEX SCORE (UNILATERAL) -4.1    VALUE CHANGE (UNILAT) -2.4             P: Begin 6 month L-Dex screens.     Hermenia Bers, PTA 06/18/2023, 3:38 PM

## 2023-06-24 DIAGNOSIS — K219 Gastro-esophageal reflux disease without esophagitis: Secondary | ICD-10-CM | POA: Diagnosis not present

## 2023-06-24 DIAGNOSIS — K31A19 Gastric intestinal metaplasia without dysplasia, unspecified site: Secondary | ICD-10-CM | POA: Diagnosis not present

## 2023-06-24 DIAGNOSIS — K2940 Chronic atrophic gastritis without bleeding: Secondary | ICD-10-CM | POA: Diagnosis not present

## 2023-07-03 ENCOUNTER — Ambulatory Visit: Payer: Medicare HMO | Attending: Cardiology | Admitting: Cardiology

## 2023-07-03 ENCOUNTER — Encounter: Payer: Self-pay | Admitting: Cardiology

## 2023-07-03 VITALS — BP 124/86 | HR 47 | Ht 65.0 in | Wt 119.2 lb

## 2023-07-03 DIAGNOSIS — I10 Essential (primary) hypertension: Secondary | ICD-10-CM

## 2023-07-03 DIAGNOSIS — I2510 Atherosclerotic heart disease of native coronary artery without angina pectoris: Secondary | ICD-10-CM

## 2023-07-03 DIAGNOSIS — E785 Hyperlipidemia, unspecified: Secondary | ICD-10-CM | POA: Diagnosis not present

## 2023-07-03 NOTE — Patient Instructions (Signed)

## 2023-07-03 NOTE — Progress Notes (Signed)
Cardiology Office Note:  .   Date:  07/03/2023  ID:  Tiffany Walsh, DOB 1955/11/11, MRN [id number] PCP: Irven Coe, MD  [facility] Providers Cardiologist:  Donato Schultz, MD     History of Present Illness: .   Tiffany Walsh is a 67 y.o. female Discussed with the use of AI scribe  History of Present Illness   The patient is a 67 year old female with a history of nonobstructive coronary artery disease, hypertension, and hyperlipidemia, who presents for a follow-up visit. The coronary artery disease was identified on a recent coronary CT scan, which showed a coronary calcium score of 114 (88th percentile). The patient had previously experienced intermediate-level, dull chest discomfort, sometimes while on a treadmill.  The patient's current medication regimen includes Crestor 20mg  daily for coronary artery disease, which is well-tolerated without any myalgias. The most recent LDL cholesterol level was 53, indicating excellent control. For hypertension, the patient is on Toprol 50mg  daily and Verapamil 360mg  daily. The patient also takes aspirin 81mg  daily.  The patient quit smoking twenty years ago. Recent lab results showed a creatinine of 0.7, HDL of 99, and Hemoglobin V2Z of 5.8. The patient's hemoglobin was mildly reduced at 10.6, which the patient reports is a chronic issue.  The patient reports feeling well overall, with no new or different issues. The patient is physically active, using a treadmill five days a week without any discomfort. The patient denies any fainting episodes or bleeding problems. The patient also reports having undergone recent endoscopy due to chronic indigestion, for which medication has been prescribed.          Studies Reviewed: .        Results LABS LDL: 53 Creatinine: 0.7 HDL: 99 Hemoglobin: 10.6 Hemoglobin A1c: 5.8  RADIOLOGY Coronary CT: Nonobstructive coronary artery disease, coronary calcium score 114, 88th percentile  (05/31/2020)  DIAGNOSTIC EKG: Sinus rhythm, subtle nonspecific ST-T wave changes Endoscopy: Chronic gastritis  Risk Assessment/Calculations:            Physical Exam:   VS:  BP 124/86   Pulse (!) 47   Ht 5\' 5"  (1.651 m)   Wt 119 lb 3.2 oz (54.1 kg)   SpO2 99%   BMI 19.84 kg/m    Wt Readings from Last 3 Encounters:  07/03/23 119 lb 3.2 oz (54.1 kg)  06/18/23 116 lb 6 oz (52.8 kg)  04/08/23 117 lb 11.2 oz (53.4 kg)    GEN: Thin, well developed in no acute distress NECK: No JVD; No carotid bruits CARDIAC: RRR, no murmurs, no rubs, no gallops RESPIRATORY:  Clear to auscultation without rales, wheezing or rhonchi  ABDOMEN: Soft, non-tender, non-distended EXTREMITIES:  No edema; No deformity   ASSESSMENT AND PLAN: .    Assessment and Plan    Nonobstructive Coronary Artery Disease Stable with no new chest discomfort. LDL at goal on Crestor 20mg  daily. -Continue Crestor 20mg  daily.  Hypertension Stable on current regimen. -Continue Toprol 50mg  daily and Verapamil 360mg  daily.  Mild Anemia Chronic, stable, and known to primary care. No new symptoms of anemia. -No changes to current management.  Gastrointestinal symptoms Chronic indigestion, recently had endoscopy. Follow-up with gastroenterologist planned. -No changes to current management.  General Health Maintenance Active lifestyle, no new health concerns. -Continue current exercise regimen. -Follow-up in 1 year.              Signed, Donato Schultz, MD

## 2023-07-16 ENCOUNTER — Other Ambulatory Visit: Payer: Self-pay | Admitting: Cardiology

## 2023-07-16 DIAGNOSIS — E785 Hyperlipidemia, unspecified: Secondary | ICD-10-CM

## 2023-07-16 DIAGNOSIS — I2510 Atherosclerotic heart disease of native coronary artery without angina pectoris: Secondary | ICD-10-CM

## 2023-07-25 DIAGNOSIS — H2513 Age-related nuclear cataract, bilateral: Secondary | ICD-10-CM | POA: Diagnosis not present

## 2023-07-25 DIAGNOSIS — H5203 Hypermetropia, bilateral: Secondary | ICD-10-CM | POA: Diagnosis not present

## 2023-07-28 DIAGNOSIS — D649 Anemia, unspecified: Secondary | ICD-10-CM | POA: Diagnosis not present

## 2023-09-15 ENCOUNTER — Ambulatory Visit: Payer: Medicare HMO

## 2023-09-29 ENCOUNTER — Inpatient Hospital Stay: Payer: Medicare HMO | Admitting: Hematology

## 2023-09-29 ENCOUNTER — Inpatient Hospital Stay: Payer: Medicare HMO

## 2023-09-29 IMAGING — US US CAROTID DUPLEX BILAT
1 series · 13 of 24 positions shown · non-contrast
Comparison: None.

CLINICAL DATA: 65-year-old female with a history of syncope

EXAM:
BILATERAL CAROTID DUPLEX ULTRASOUND
TECHNIQUE: Gray scale imaging, color Doppler and duplex ultrasound were
performed of bilateral carotid and vertebral arteries in the neck.

[Series 1: us carotid duplex bilat · 0.06mm/px · 13 of 73 slices shown]
[im 1/73]
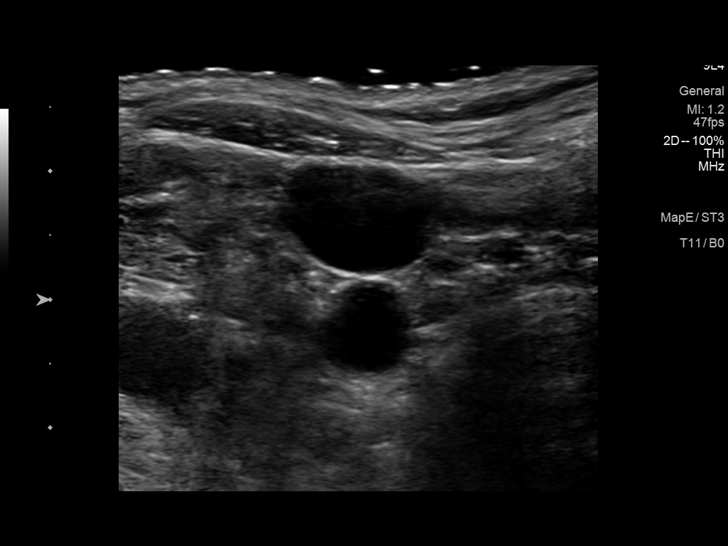
[im 7/73]
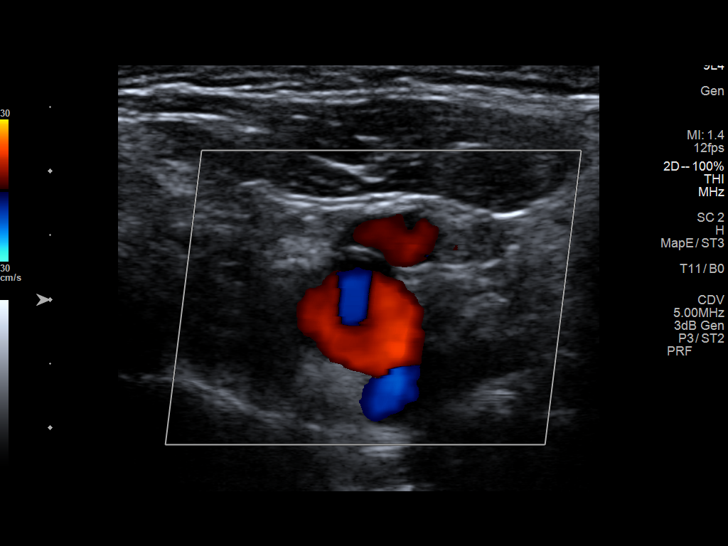
[im 13/73]
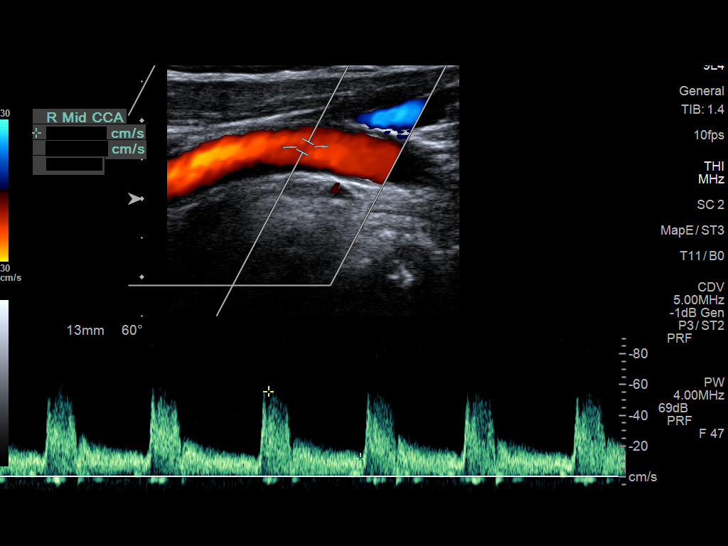
[im 19/73]
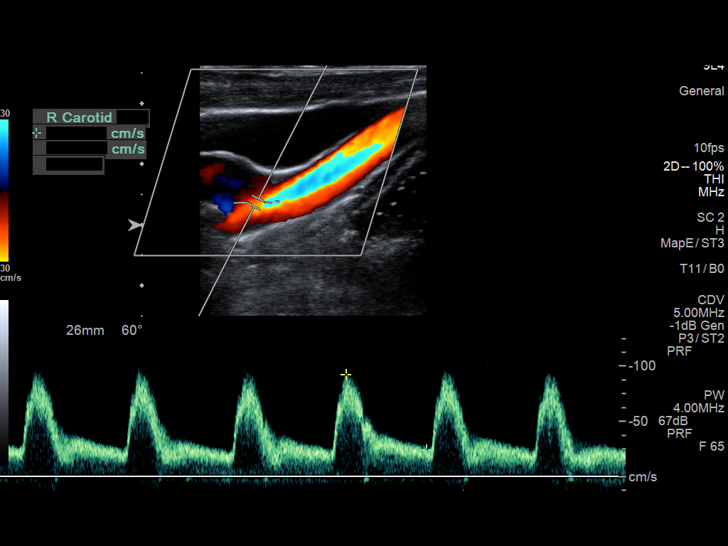
[im 26/73]
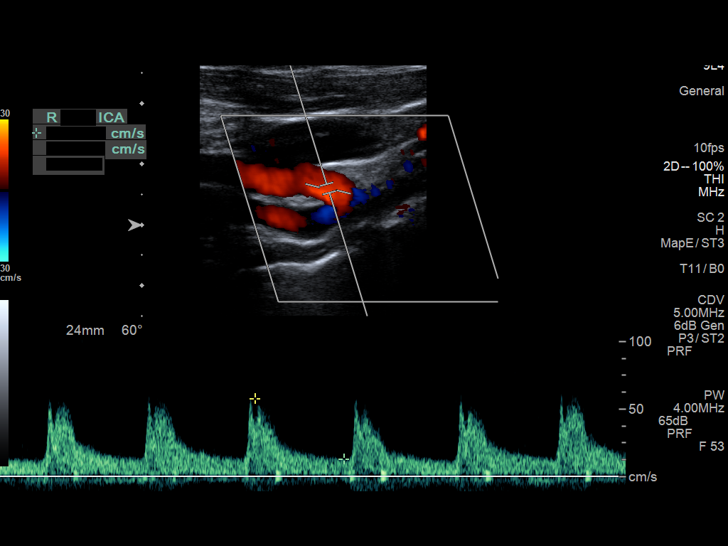
[im 32/73]
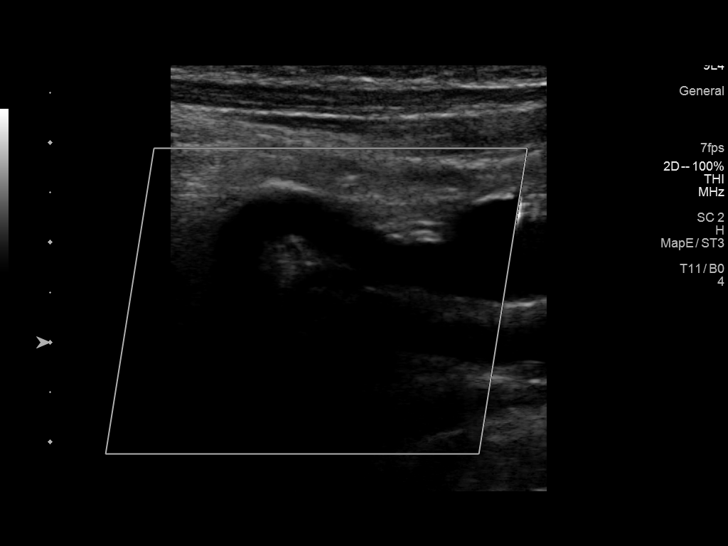
[im 38/73]
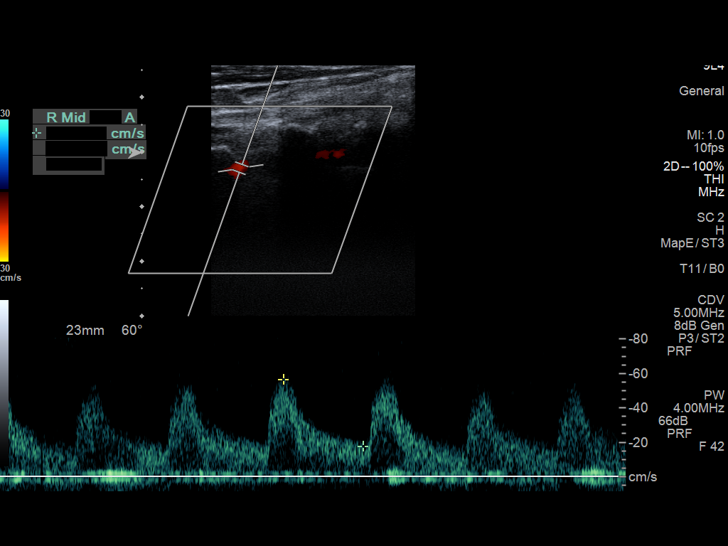
[im 41/73]
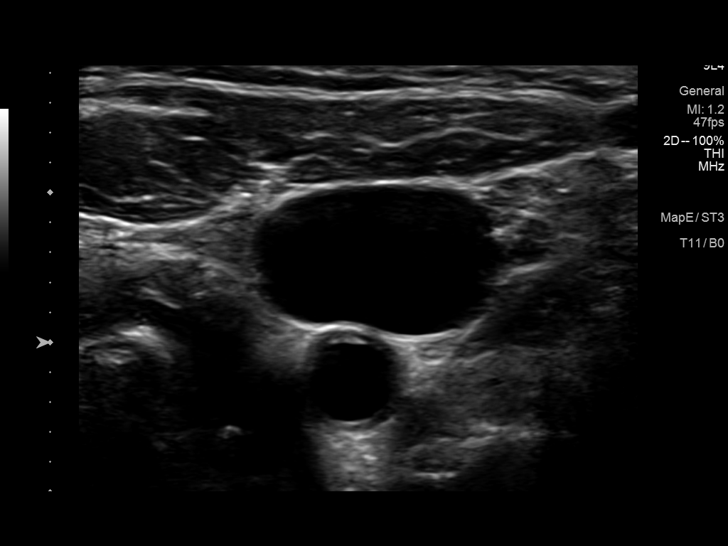
[im 47/73]
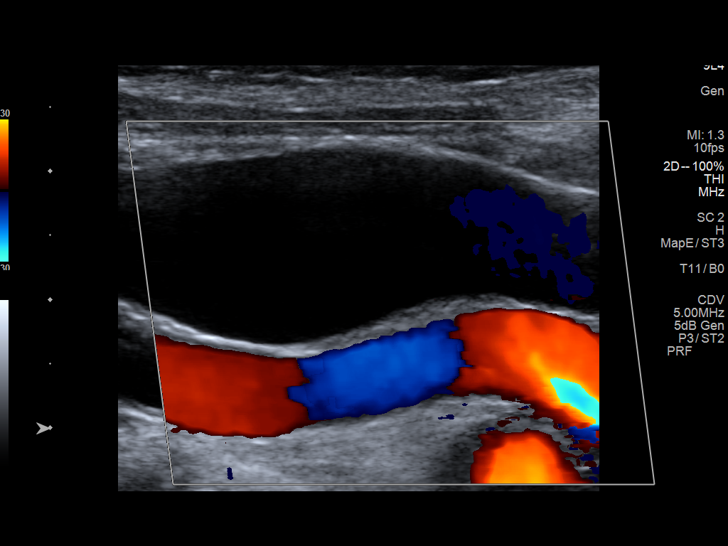
[im 54/73]
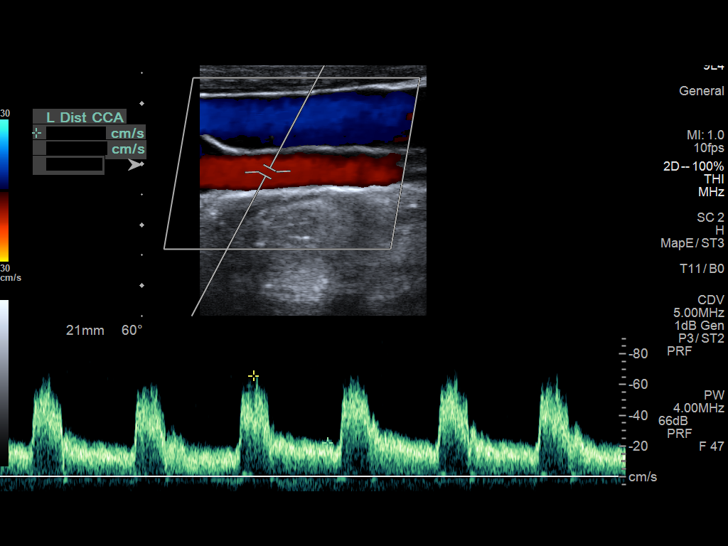
[im 60/73]
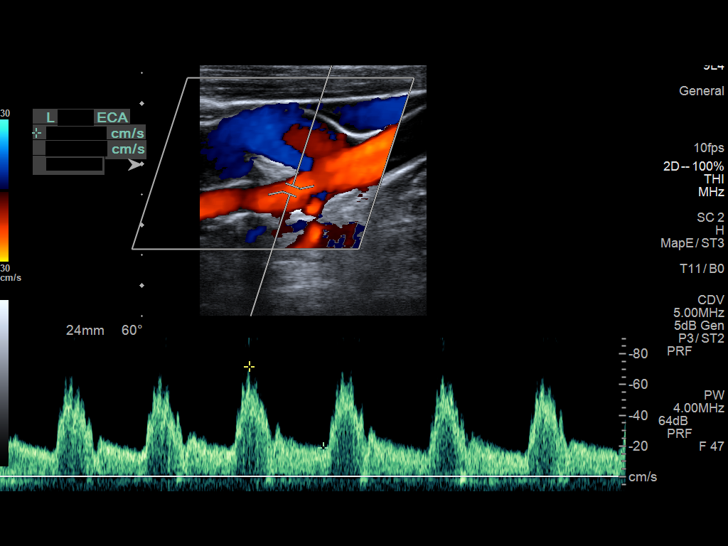
[im 66/73]
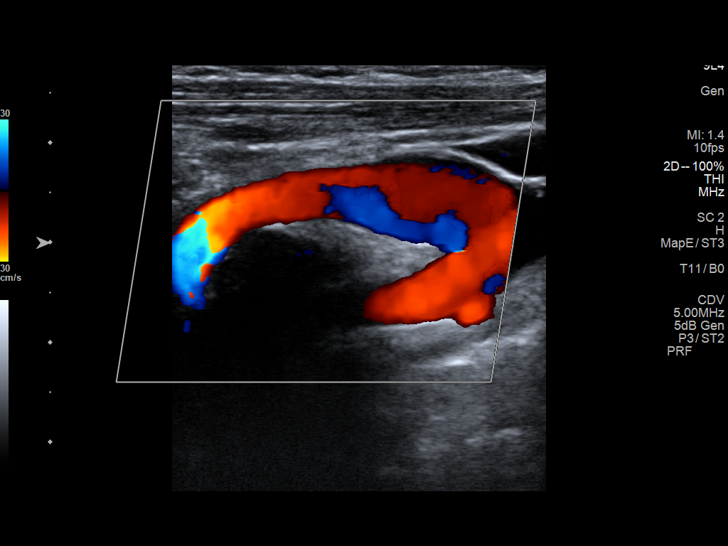
[im 73/73]
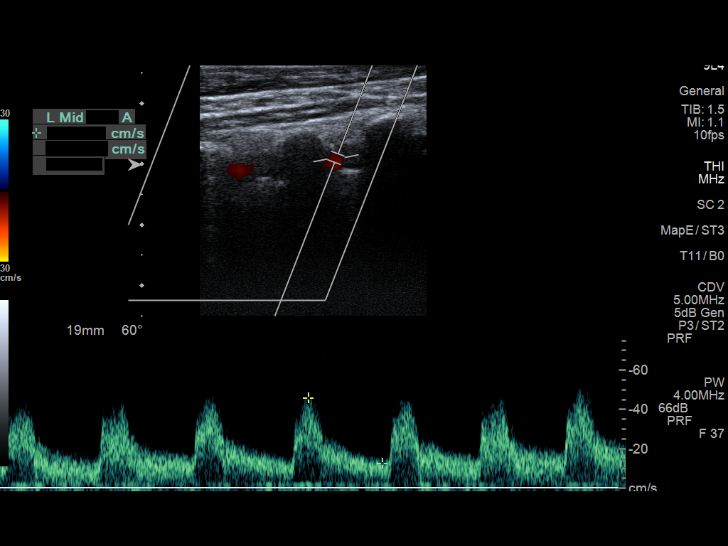

[13 of 24 positions shown; findings below may reference images not displayed]

FINDINGS: Criteria: Quantification of carotid stenosis is based on velocity
parameters that correlate the residual internal carotid diameter
with NASCET-based stenosis levels, using the diameter of the distal
internal carotid lumen as the denominator for stenosis measurement.

The following velocity measurements were obtained:

RIGHT

ICA:  Systolic 99 cm/sec, Diastolic 34 cm/sec

CCA:  67 cm/sec

SYSTOLIC ICA/CCA RATIO:

ECA:  78 cm/sec

LEFT

ICA:  Systolic 99 cm/sec, Diastolic 32 cm/sec

CCA:  77 cm/sec

SYSTOLIC ICA/CCA RATIO:

ECA:  72 cm/sec

Right Brachial SBP: Not acquired

Left Brachial SBP: 154

RIGHT CAROTID ARTERY: No significant calcified disease of the right
common carotid artery. Intermediate waveform maintained. Homogeneous
plaque without significant calcifications at the right carotid
bifurcation. Low resistance waveform of the right ICA. Tortuosity

RIGHT VERTEBRAL ARTERY: Antegrade flow with low resistance waveform.

LEFT CAROTID ARTERY: No significant calcified disease of the left
common carotid artery. Intermediate waveform maintained. Homogeneous
plaque at the left carotid bifurcation without significant
calcifications. Low resistance waveform of the left ICA. Tortuosity

LEFT VERTEBRAL ARTERY:  Antegrade flow with low resistance waveform.
IMPRESSION: Color duplex indicates minimal homogeneous plaque, with no
hemodynamically significant stenosis by duplex criteria in the
extracranial cerebrovascular circulation.

## 2023-10-03 ENCOUNTER — Inpatient Hospital Stay: Payer: Medicare HMO | Attending: Hematology

## 2023-10-03 ENCOUNTER — Inpatient Hospital Stay: Payer: Medicare HMO | Admitting: Hematology

## 2023-10-03 ENCOUNTER — Other Ambulatory Visit: Payer: Self-pay | Admitting: Nurse Practitioner

## 2023-10-03 VITALS — BP 158/92 | HR 62 | Temp 97.8°F | Resp 18 | Wt 120.3 lb

## 2023-10-03 DIAGNOSIS — D649 Anemia, unspecified: Secondary | ICD-10-CM | POA: Diagnosis not present

## 2023-10-03 DIAGNOSIS — K219 Gastro-esophageal reflux disease without esophagitis: Secondary | ICD-10-CM | POA: Diagnosis not present

## 2023-10-03 DIAGNOSIS — Z79811 Long term (current) use of aromatase inhibitors: Secondary | ICD-10-CM | POA: Insufficient documentation

## 2023-10-03 DIAGNOSIS — Z170 Estrogen receptor positive status [ER+]: Secondary | ICD-10-CM | POA: Insufficient documentation

## 2023-10-03 DIAGNOSIS — C50411 Malignant neoplasm of upper-outer quadrant of right female breast: Secondary | ICD-10-CM | POA: Diagnosis not present

## 2023-10-03 DIAGNOSIS — I10 Essential (primary) hypertension: Secondary | ICD-10-CM | POA: Diagnosis not present

## 2023-10-03 LAB — CBC WITH DIFFERENTIAL (CANCER CENTER ONLY)
Abs Immature Granulocytes: 0.02 10*3/uL (ref 0.00–0.07)
Basophils Absolute: 0 10*3/uL (ref 0.0–0.1)
Basophils Relative: 0 %
Eosinophils Absolute: 0.1 10*3/uL (ref 0.0–0.5)
Eosinophils Relative: 1 %
HCT: 34.1 % — ABNORMAL LOW (ref 36.0–46.0)
Hemoglobin: 11.2 g/dL — ABNORMAL LOW (ref 12.0–15.0)
Immature Granulocytes: 0 %
Lymphocytes Relative: 11 %
Lymphs Abs: 0.5 10*3/uL — ABNORMAL LOW (ref 0.7–4.0)
MCH: 29.6 pg (ref 26.0–34.0)
MCHC: 32.8 g/dL (ref 30.0–36.0)
MCV: 90.2 fL (ref 80.0–100.0)
Monocytes Absolute: 0.5 10*3/uL (ref 0.1–1.0)
Monocytes Relative: 9 %
Neutro Abs: 3.8 10*3/uL (ref 1.7–7.7)
Neutrophils Relative %: 79 %
Platelet Count: 210 10*3/uL (ref 150–400)
RBC: 3.78 MIL/uL — ABNORMAL LOW (ref 3.87–5.11)
RDW: 13.5 % (ref 11.5–15.5)
WBC Count: 4.9 10*3/uL (ref 4.0–10.5)
nRBC: 0 % (ref 0.0–0.2)

## 2023-10-03 LAB — CMP (CANCER CENTER ONLY)
ALT: 12 U/L (ref 0–44)
AST: 20 U/L (ref 15–41)
Albumin: 4.4 g/dL (ref 3.5–5.0)
Alkaline Phosphatase: 83 U/L (ref 38–126)
Anion gap: 8 (ref 5–15)
BUN: 12 mg/dL (ref 8–23)
CO2: 31 mmol/L (ref 22–32)
Calcium: 9.1 mg/dL (ref 8.9–10.3)
Chloride: 105 mmol/L (ref 98–111)
Creatinine: 0.7 mg/dL (ref 0.44–1.00)
GFR, Estimated: >60 mL/min (ref >60)
Glucose, Bld: 95 mg/dL (ref 70–99)
Potassium: 3.3 mmol/L — ABNORMAL LOW (ref 3.5–5.1)
Sodium: 144 mmol/L (ref 135–145)
Total Bilirubin: 0.5 mg/dL (ref 0.0–1.2)
Total Protein: 7.2 g/dL (ref 6.5–8.1)

## 2023-10-03 NOTE — Assessment & Plan Note (Signed)
 stage IA p(T1c, N0), ER+/PR+/Her2-, Grade 1, RS 16 -found on screening mammogram. Biopsy on 06/11/21 showed invasive ductal carcinoma, low grade. -right lumpectomy on 07/12/21 by Dr. Vernetta. Pathology showed 1.5 cm invasive and in situ ductal carcinoma. Margins and lymph nodes negative. -she received radiation 11/20-12/21/22 under Dr. Izell -she started anastrazole in 10/2021, tolerating well so far

## 2023-10-03 NOTE — Progress Notes (Signed)
 [facility]   Telephone:(336) [phone] [phone]   Clinic Follow up Note   Patient Care Team: [name], MD as PCP - General (Family Medicine) Jeffrie Oneil BROCKS, MD as PCP - Cardiology (Cardiology) Glean Stephane BROCKS, RN as Oncology Nurse Navigator Tyree Nanetta SAILOR, RN as Oncology Nurse Navigator Vernetta Berg, MD as Consulting Physician (General Surgery) Izell Domino, MD as Attending Physician (Radiation Oncology) Lanny Callander, MD as Attending Physician (Hematology and Oncology)  Date of Service:  10/03/2023  CHIEF COMPLAINT: f/u of right breast cancer  CURRENT THERAPY:  Anastrozole   Oncology History   Malignant neoplasm of upper-outer quadrant of right breast in female, estrogen receptor positive (HCC) stage IA p(T1c, N0), ER+/PR+/Her2-, Grade 1, RS 16 -found on screening mammogram. Biopsy on 06/11/21 showed invasive ductal carcinoma, low grade. -right lumpectomy on 07/12/21 by Dr. Vernetta. Pathology showed 1.5 cm invasive and in situ ductal carcinoma. Margins and lymph nodes negative. -she received radiation 11/20-12/21/22 under Dr. Izell -she started anastrazole in 10/2021, tolerating well so far    Assessment and Plan    Breast Cancer (Post-Surgery Follow-Up) 68 year old here for follow-up. No new concerns or symptoms. Mild scar tissue around the right breast incision site, no significant swelling or discomfort. Left breast examination normal. Last mammogram in September 2024, next scheduled for September 2025. Bone density scan in 2023 was normal, next scan in 2025. - Continue follow-up every six months until five years post-surgery, then annually - Schedule next mammogram for September 2025 - Schedule next bone density scan for 2025 - Perform breast exam today  Anemia Mild anemia with hemoglobin at 11.2. Managed by a gastroenterologist. Recent endoscopy showed changes in stomach lining, follow-up endoscopy in two years. Currently taking iron pills  four days a week. - Continue iron pills four days a week - Follow-up with gastroenterologist as scheduled  Hypertension On metoprolol  and verapamil. Blood pressure slightly high today, probably related to her recent stress from her husband's death and taking care of her mother - Check blood pressure at home, f/u with PCP   Gastroesophageal Reflux Disease (GERD) On Nexium and Pepcid . No new symptoms reported. - Continue Nexium and Pepcid   General Health Maintenance Exercises five days a week but limited by knee pain. Scheduled to see an orthopedist next Wednesday. Blood counts, kidney, and liver function tests are normal. - Follow-up with orthopedist for knee pain - Continue exercise regimen as tolerated  Plan -She is clinically doing well, no concern for recurrence -Continue anastrozole , she is tolerating well - Schedule next follow-up in six months with nurse practitioner.         SUMMARY OF ONCOLOGIC HISTORY: Oncology History Overview Note   Cancer Staging  Malignant neoplasm of upper-outer quadrant of right breast in female, estrogen receptor positive (HCC) Staging form: Breast, AJCC 8th Edition - Clinical stage from 06/20/2021: Stage IA (cT1b, cN0, cM0, G1, ER+, PR+, HER2-) - Signed by Layla Sandria BROCKS, MD on 06/20/2021 Stage prefix: Initial diagnosis Histologic grading system: 3 grade system Laterality: Right Staged by: Pathologist and managing physician Stage used in treatment planning: Yes National guidelines used in treatment planning: Yes Type of national guideline used in treatment planning: NCCN     Malignant neoplasm of upper-outer quadrant of right breast in female, estrogen receptor positive (HCC)  06/15/2021 Initial Diagnosis   Malignant neoplasm of upper-outer quadrant of right breast in female, estrogen receptor positive (HCC)   06/20/2021 Cancer Staging   Staging form: Breast, AJCC 8th Edition - Clinical  stage from 06/20/2021: Stage IA (cT1b, cN0, cM0,  G1, ER+, PR+, HER2-) - Signed by Layla Sandria BROCKS, MD on 06/20/2021 Stage prefix: Initial diagnosis Histologic grading system: 3 grade system Laterality: Right Staged by: Pathologist and managing physician Stage used in treatment planning: Yes National guidelines used in treatment planning: Yes Type of national guideline used in treatment planning: NCCN   07/09/2021 Genetic Testing   [facility] CustomNext Panel was negative. No pathogenic variants were identified in the 47 genes analyzed. A variant of uncertain significance was detected in the MSH6 gene. Report date is 07/09/2021.  The CustomNext-Cancer+RNAinsight panel offered by [employer] includes sequencing and rearrangement analysis for the following 47 genes:  APC, ATM, AXIN2, BARD1, BMPR1A, BRCA1, BRCA2, BRIP1, CDH1, CDK4, CDKN2A, CHEK2, DICER1, EPCAM, GREM1, HOXB13, MEN1, MLH1, MSH2, MSH3, MSH6, MUTYH, NBN, NF1, NF2, NTHL1, PALB2, PMS2, POLD1, POLE, PTEN, RAD51C, RAD51D, RECQL, RET, SDHA, SDHAF2, SDHB, SDHC, SDHD, SMAD4, SMARCA4, STK11, TP53, TSC1, TSC2, and VHL.  RNA data is routinely analyzed for use in variant interpretation for all genes.  UPDATE: MSH6 p.T1100M VUS has been amended to Likely Benign. Amended report date is 09/12/2022.   07/12/2021 Surgery   right lumpectomy and sentinel lymph node sampling 07/12/2021 showed a pT1c pN0, stage IA invasive ductal carcinoma, grade 1, with negative margins.             (a) a total of 5 right axillary lymph nodes were removed      07/12/2021 Oncotype testing   Oncotype score of 16 predicts a risk of recurrence outside the breast within the next 9 years of 4% if the patient's only systemic therapy is antiestrogens for 5 years.  It also predicts no significant benefit from chemotherapy.   08/22/2021 - 09/12/2021 Radiation Therapy   08/22/2021 through 09/12/2021 Site Technique Total Dose (Gy) Dose per Fx (Gy) Completed Fx Beam Energies  Breast, Right: Breast_Rt 3D 42.56/42.56 2.66 16/16  6X, 10X     09/2021 -  Anti-estrogen oral therapy   Tamoxifen  daily--d/c after 6 weeks due to vaginal discharge Changed to Anastrozole  11/17/2021      Discussed the use of AI scribe software for clinical note transcription with the patient, who gave verbal consent to proceed.  History of Present Illness   The patient, a 68 year old female with a history of breast cancer, presents for a routine follow-up. She reports feeling well and denies any new health issues or changes in her condition over the past year. She has not experienced any problems with her breasts or any discomfort since her surgery. She has been taking anastrozole  without any issues.  The patient has a history of intermittent anemia, which is currently being managed with iron pills four days a week. She has been under the care of a gastroenterologist for this issue. She recently had an endoscopy, which revealed changes in her stomach lining.  The patient also reports having acid reflux, which is being managed with Nexium and Pepcid . She is also on metoprolol , a cholesterol medication, verapamil, and a blood pressure medication.  The patient exercises five days a week but has recently had to pause due to a knee injury.         All other systems were reviewed with the patient and are negative.  MEDICAL HISTORY:  Past Medical History:  Diagnosis Date   Abdominal pain, LLQ    Anemia    Arthritis of right knee    Cervical dysplasia    CIN I (cervical intraepithelial neoplasia I)  CIN II (cervical intraepithelial neoplasia II)    Colon cancer (HCC)    Epigastric pain    GERD (gastroesophageal reflux disease)    Heme positive stool    History of rectal cancer    Hx SBO    Hypercholesterolemia    Hypertension    Malignant neoplasm of rectum, rectosigmoid junction, and anus    Nausea with vomiting, unspecified    Peripheral neuropathy due to chemotherapy [facility])    Rectal cancer (HCC)    stage 3   Vaginal delivery  8026,8024    SURGICAL HISTORY: Past Surgical History:  Procedure Laterality Date   BREAST LUMPECTOMY WITH RADIOACTIVE SEED AND SENTINEL LYMPH NODE BIOPSY Right 07/12/2021   Procedure: RIGHT BREAST LUMPECTOMY WITH RADIOACTIVE SEED AND SENTINEL LYMPH NODE BIOPSY;  Surgeon: Vernetta Berg, MD;  Location: [facility];  Service: General;  Laterality: Right;   CERVICAL CONIZATION W/BX N/A 04/04/2015   Procedure: CONIZATION CERVIX WITH BIOPSY;  Surgeon: Hargis Paradise, MD;  Location: [facility];  Service: Gynecology;  Laterality: N/A;   COLON SURGERY     MOUTH SURGERY     widom teeth       I have reviewed the social history and family history with the patient and they are unchanged from previous note.  ALLERGIES:  has no known allergies.  MEDICATIONS:  Current Outpatient Medications  Medication Sig Dispense Refill   acetaminophen  (TYLENOL ) 325 MG tablet Take 650 mg by mouth as needed for mild pain or headache.     anastrozole  (ARIMIDEX ) 1 MG tablet TAKE 1 TABLET BY MOUTH EVERY DAY 90 tablet 1   aspirin  EC 81 MG tablet Take 1 tablet (81 mg total) by mouth every other day. Swallow whole. 90 tablet 3   cholecalciferol (VITAMIN D3) 25 MCG (1000 UNIT) tablet Take 1,000 Units by mouth daily.     Cyanocobalamin  (VITAMIN B-12 PO) Take by mouth every other day.     esomeprazole (NEXIUM) 40 MG capsule Take 40 mg by mouth 2 (two) times daily before a meal.     famotidine  (PEPCID ) 20 MG tablet Take 20 mg by mouth 2 (two) times daily.     ferrous sulfate  325 (65 FE) MG EC tablet Take 1 tablet (325 mg total) by mouth every Monday, Wednesday, and Friday. Take one tablet twice a day. (Patient taking differently: Take 325 mg by mouth 4 (four) times a week. Every M-W-Th-F) 30 tablet 0   metoprolol  succinate (TOPROL -XL) 50 MG 24 hr tablet Take 50 mg by mouth daily.     rosuvastatin  (CRESTOR ) 20 MG tablet TAKE 1 TABLET BY MOUTH ONCE DAILY 90 tablet 3   verapamil (VERELAN PM) 360 MG 24 hr capsule  Take 360 mg by mouth daily.     No current facility-administered medications for this visit.    PHYSICAL EXAMINATION: ECOG PERFORMANCE STATUS: 0 - Asymptomatic  Vitals:   10/03/23 1030 10/03/23 1049  BP: (!) 145/83 (!) 158/92  Pulse: 60 62  Resp: 18   Temp: 97.8 F (36.6 C)   SpO2: 100%    Wt Readings from Last 3 Encounters:  10/03/23 120 lb 4.8 oz (54.6 kg)  07/03/23 119 lb 3.2 oz (54.1 kg)  06/18/23 116 lb 6 oz (52.8 kg)     GENERAL:alert, no distress and comfortable SKIN: skin color, texture, turgor are normal, no rashes or significant lesions EYES: normal, Conjunctiva are pink and non-injected, sclera clear NECK: supple, thyroid  normal size, non-tender, without nodularity LYMPH:  no palpable lymphadenopathy  in the cervical, axillary  LUNGS: clear to auscultation and percussion with normal breathing effort HEART: regular rate & rhythm and no murmurs and no lower extremity edema ABDOMEN:abdomen soft, non-tender and normal bowel sounds Musculoskeletal:no cyanosis of digits and no clubbing  NEURO: alert & oriented x 3 with fluent speech, no focal motor/sensory deficits Breast exam showed mild lymphedema of right breast, no palpable mass or adenopathy   LABORATORY DATA:  I have reviewed the data as listed    Latest Ref Rng & Units 10/03/2023   10:13 AM 04/08/2023    2:10 PM 09/30/2022   10:30 AM  CBC  WBC 4.0 - 10.5 K/uL 4.9  4.2  5.9   Hemoglobin 12.0 - 15.0 g/dL 88.7  88.5  89.0   Hematocrit 36.0 - 46.0 % 34.1  34.6  33.2   Platelets 150 - 400 K/uL 210  181  198         Latest Ref Rng & Units 10/03/2023   10:13 AM 04/08/2023    2:10 PM 09/30/2022   10:30 AM  CMP  Glucose 70 - 99 mg/dL 95  87  86   BUN 8 - 23 mg/dL 12  17  16    Creatinine 0.44 - 1.00 mg/dL 9.29  9.22  9.11   Sodium 135 - 145 mmol/L 144  143  140   Potassium 3.5 - 5.1 mmol/L 3.3  3.6  4.1   Chloride 98 - 111 mmol/L 105  108  105   CO2 22 - 32 mmol/L 31  28  29    Calcium  8.9 - 10.3 mg/dL 9.1  9.2   9.4   Total Protein 6.5 - 8.1 g/dL 7.2  6.9  6.7   Total Bilirubin 0.0 - 1.2 mg/dL 0.5  0.5  0.5   Alkaline Phos 38 - 126 U/L 83  86  77   AST 15 - 41 U/L 20  21  16    ALT 0 - 44 U/L 12  13  11        RADIOGRAPHIC STUDIES: I have personally reviewed the radiological images as listed and agreed with the findings in the report. No results found.    No orders of the defined types were placed in this encounter.  All questions were answered. The patient knows to call the clinic with any problems, questions or concerns. No barriers to learning was detected. The total time spent in the appointment was 25 minutes.     Onita Mattock, MD 10/03/2023

## 2023-10-06 ENCOUNTER — Telehealth: Payer: Self-pay | Admitting: Adult Health

## 2023-10-06 NOTE — Telephone Encounter (Signed)
 Patient is aware of scheduled appointment times/dates

## 2023-10-08 DIAGNOSIS — M1711 Unilateral primary osteoarthritis, right knee: Secondary | ICD-10-CM | POA: Diagnosis not present

## 2023-11-04 DIAGNOSIS — I10 Essential (primary) hypertension: Secondary | ICD-10-CM | POA: Diagnosis not present

## 2023-11-05 DIAGNOSIS — R899 Unspecified abnormal finding in specimens from other organs, systems and tissues: Secondary | ICD-10-CM | POA: Diagnosis not present

## 2023-11-06 ENCOUNTER — Encounter: Payer: Self-pay | Admitting: Gastroenterology

## 2023-11-07 ENCOUNTER — Other Ambulatory Visit: Payer: Self-pay | Admitting: Gastroenterology

## 2023-11-07 ENCOUNTER — Encounter: Payer: Self-pay | Admitting: Gastroenterology

## 2023-11-07 DIAGNOSIS — D649 Anemia, unspecified: Secondary | ICD-10-CM

## 2023-11-17 ENCOUNTER — Ambulatory Visit: Payer: Medicare HMO | Attending: Surgery | Admitting: Physical Therapy

## 2023-11-17 DIAGNOSIS — Z483 Aftercare following surgery for neoplasm: Secondary | ICD-10-CM | POA: Insufficient documentation

## 2023-11-25 DIAGNOSIS — R7303 Prediabetes: Secondary | ICD-10-CM | POA: Diagnosis not present

## 2023-11-25 DIAGNOSIS — I10 Essential (primary) hypertension: Secondary | ICD-10-CM | POA: Diagnosis not present

## 2023-11-25 DIAGNOSIS — E559 Vitamin D deficiency, unspecified: Secondary | ICD-10-CM | POA: Diagnosis not present

## 2023-11-25 DIAGNOSIS — E7800 Pure hypercholesterolemia, unspecified: Secondary | ICD-10-CM | POA: Diagnosis not present

## 2023-11-27 DIAGNOSIS — D649 Anemia, unspecified: Secondary | ICD-10-CM | POA: Diagnosis not present

## 2023-11-27 DIAGNOSIS — R7303 Prediabetes: Secondary | ICD-10-CM | POA: Diagnosis not present

## 2023-11-27 DIAGNOSIS — E7800 Pure hypercholesterolemia, unspecified: Secondary | ICD-10-CM | POA: Diagnosis not present

## 2023-11-27 DIAGNOSIS — Z8719 Personal history of other diseases of the digestive system: Secondary | ICD-10-CM | POA: Diagnosis not present

## 2023-11-27 DIAGNOSIS — K219 Gastro-esophageal reflux disease without esophagitis: Secondary | ICD-10-CM | POA: Diagnosis not present

## 2023-11-27 DIAGNOSIS — I10 Essential (primary) hypertension: Secondary | ICD-10-CM | POA: Diagnosis not present

## 2023-11-27 DIAGNOSIS — C50411 Malignant neoplasm of upper-outer quadrant of right female breast: Secondary | ICD-10-CM | POA: Diagnosis not present

## 2023-11-27 DIAGNOSIS — G620 Drug-induced polyneuropathy: Secondary | ICD-10-CM | POA: Diagnosis not present

## 2023-11-27 DIAGNOSIS — K5900 Constipation, unspecified: Secondary | ICD-10-CM | POA: Diagnosis not present

## 2023-11-27 DIAGNOSIS — E559 Vitamin D deficiency, unspecified: Secondary | ICD-10-CM | POA: Diagnosis not present

## 2023-11-27 DIAGNOSIS — Z85048 Personal history of other malignant neoplasm of rectum, rectosigmoid junction, and anus: Secondary | ICD-10-CM | POA: Diagnosis not present

## 2023-12-01 ENCOUNTER — Ambulatory Visit: Payer: Medicare HMO

## 2023-12-01 DIAGNOSIS — E2839 Other primary ovarian failure: Secondary | ICD-10-CM | POA: Diagnosis not present

## 2023-12-01 DIAGNOSIS — N958 Other specified menopausal and perimenopausal disorders: Secondary | ICD-10-CM | POA: Diagnosis not present

## 2023-12-04 ENCOUNTER — Ambulatory Visit
Admission: RE | Admit: 2023-12-04 | Discharge: 2023-12-04 | Disposition: A | Discharge status: Home / Self Care | Payer: Medicare HMO | Source: Ambulatory Visit | Attending: Gastroenterology | Admitting: Gastroenterology

## 2023-12-04 DIAGNOSIS — Z85038 Personal history of other malignant neoplasm of large intestine: Secondary | ICD-10-CM | POA: Diagnosis not present

## 2023-12-04 DIAGNOSIS — D649 Anemia, unspecified: Secondary | ICD-10-CM

## 2023-12-04 DIAGNOSIS — Z9049 Acquired absence of other specified parts of digestive tract: Secondary | ICD-10-CM | POA: Diagnosis not present

## 2023-12-04 MED ORDER — IOPAMIDOL (ISOVUE-300) INJECTION 61%
100.0000 mL | Freq: Once | INTRAVENOUS | Status: AC | PRN
  Administered 2023-12-04: 80 mL via INTRAVENOUS

## 2023-12-08 ENCOUNTER — Telehealth: Payer: Self-pay | Admitting: *Deleted

## 2023-12-08 NOTE — Telephone Encounter (Signed)
 Per Lillard Anes, called pt to make aware that bone density results were normal. Pt verbalized conversation and advised to f/u as scheduled.

## 2023-12-09 ENCOUNTER — Encounter: Payer: Self-pay | Admitting: Adult Health

## 2023-12-15 ENCOUNTER — Ambulatory Visit: Attending: Surgery

## 2023-12-15 DIAGNOSIS — Z483 Aftercare following surgery for neoplasm: Secondary | ICD-10-CM | POA: Insufficient documentation

## 2023-12-15 NOTE — Therapy (Signed)
 OUTPATIENT PHYSICAL THERAPY SOZO SCREENING NOTE   Patient Name: Tiffany Walsh MRN: [id number] DOB:09-03-1956, 68 y.o., female Today's Date: 12/15/2023  PCP: Irven Coe, MD REFERRING PROVIDER: Abigail Miyamoto, MD   PT End of Session - 12/15/23 1607     Visit Number 6   unchanged due to screen   PT Start Time 1608    PT Stop Time 1615    PT Time Calculation (min) 7 min    Activity Tolerance Patient tolerated treatment well    Behavior During Therapy WFL for tasks assessed/performed             Past Medical History:  Diagnosis Date   Abdominal pain, LLQ    Anemia    Arthritis of right knee    Cervical dysplasia    CIN I (cervical intraepithelial neoplasia I)    CIN II (cervical intraepithelial neoplasia II)    Colon cancer (HCC)    Epigastric pain    GERD (gastroesophageal reflux disease)    Heme positive stool    History of rectal cancer    Hx SBO    Hypercholesterolemia    Hypertension    Malignant neoplasm of rectum, rectosigmoid junction, and anus    Nausea with vomiting, unspecified    Peripheral neuropathy due to chemotherapy (HCC)    Rectal cancer (HCC)    stage 3   Vaginal delivery 5284,1324   Past Surgical History:  Procedure Laterality Date   BREAST LUMPECTOMY WITH RADIOACTIVE SEED AND SENTINEL LYMPH NODE BIOPSY Right 07/12/2021   Procedure: RIGHT BREAST LUMPECTOMY WITH RADIOACTIVE SEED AND SENTINEL LYMPH NODE BIOPSY;  Surgeon: Abigail Miyamoto, MD;  Location: [facility];  Service: General;  Laterality: Right;   CERVICAL CONIZATION W/BX N/A 04/04/2015   Procedure: CONIZATION CERVIX WITH BIOPSY;  Surgeon: Geryl Rankins, MD;  Location: [facility];  Service: Gynecology;  Laterality: N/A;   COLON SURGERY     MOUTH SURGERY     widom teeth      Patient Active Problem List   Diagnosis Date Noted   Pure hypercholesterolemia 04/08/2023   Peripheral neuropathy due to chemotherapy (HCC) 04/08/2023   Gastro-esophageal reflux disease without  esophagitis 04/08/2023   Genetic testing 06/27/2021   Family history of breast cancer 06/20/2021   Family history of colon cancer 06/20/2021   Malignant neoplasm of upper-outer quadrant of right breast in female, estrogen receptor positive (HCC) 06/15/2021   Hypokalemia    SBO (small bowel obstruction) (HCC) 05/25/2019   Essential hypertension 05/25/2019    REFERRING DIAG: right breast cancer at risk for lymphedema  THERAPY DIAG: Aftercare following surgery for neoplasm  PERTINENT HISTORY: Patient was diagnosed on 05/23/2021 with right grade I invasive ductal carcinoma breast cancer.She underwent a right lumpectomy and sentinel node biopsy (5 negative nodes) on 07/12/2021. It is ER/PR positive and HER2 negative with a Ki67 of 10%.   PRECAUTIONS: right UE Lymphedema risk, None  SUBJECTIVE: Pt returns for her 3 month L-Dex screen.   PAIN:  Are you having pain? No  SOZO SCREENING: Patient was assessed today using the SOZO machine to determine the lymphedema index score. This was compared to her baseline score. It was determined that she is within the recommended range when compared to her baseline and no further action is needed at this time. She will continue SOZO screenings. These are done every 3 months for 2 years post operatively followed by every 6 months for 2 years, and then annually.   L-DEX FLOWSHEETS - 12/15/23  1600       L-DEX LYMPHEDEMA SCREENING   Measurement Type Unilateral    L-DEX MEASUREMENT EXTREMITY Upper Extremity    POSITION  Standing    DOMINANT SIDE Right    At Risk Side Right    BASELINE SCORE (UNILATERAL) -1.7    L-DEX SCORE (UNILATERAL) -5.9    VALUE CHANGE (UNILAT) -4.2              P: Begin 6 month L-Dex screens.     Waynette Buttery, PT 12/15/2023, 4:16 PM

## 2024-03-22 DIAGNOSIS — E7800 Pure hypercholesterolemia, unspecified: Secondary | ICD-10-CM | POA: Diagnosis not present

## 2024-03-22 DIAGNOSIS — I10 Essential (primary) hypertension: Secondary | ICD-10-CM | POA: Diagnosis not present

## 2024-03-22 DIAGNOSIS — C50411 Malignant neoplasm of upper-outer quadrant of right female breast: Secondary | ICD-10-CM | POA: Diagnosis not present

## 2024-03-22 DIAGNOSIS — Z85048 Personal history of other malignant neoplasm of rectum, rectosigmoid junction, and anus: Secondary | ICD-10-CM | POA: Diagnosis not present

## 2024-03-30 ENCOUNTER — Telehealth: Payer: Self-pay | Admitting: Adult Health

## 2024-03-30 NOTE — Telephone Encounter (Signed)
 Rescheule appointment per provider pal.  Called left VM with changes made to the upcoming appointment.

## 2024-03-31 ENCOUNTER — Other Ambulatory Visit: Payer: Self-pay | Admitting: Nurse Practitioner

## 2024-04-05 ENCOUNTER — Inpatient Hospital Stay: Payer: Medicare HMO | Admitting: Adult Health

## 2024-04-05 ENCOUNTER — Inpatient Hospital Stay: Payer: Medicare HMO

## 2024-04-16 ENCOUNTER — Other Ambulatory Visit: Payer: Self-pay

## 2024-04-16 DIAGNOSIS — C50411 Malignant neoplasm of upper-outer quadrant of right female breast: Secondary | ICD-10-CM

## 2024-04-19 ENCOUNTER — Encounter: Payer: Self-pay | Admitting: Adult Health

## 2024-04-19 ENCOUNTER — Inpatient Hospital Stay: Attending: Adult Health

## 2024-04-19 ENCOUNTER — Inpatient Hospital Stay: Admitting: Adult Health

## 2024-04-19 VITALS — BP 135/57 | HR 53 | Temp 98.3°F | Resp 18 | Ht 65.0 in | Wt 117.5 lb

## 2024-04-19 DIAGNOSIS — Z170 Estrogen receptor positive status [ER+]: Secondary | ICD-10-CM

## 2024-04-19 DIAGNOSIS — C50411 Malignant neoplasm of upper-outer quadrant of right female breast: Secondary | ICD-10-CM

## 2024-04-19 DIAGNOSIS — Z923 Personal history of irradiation: Secondary | ICD-10-CM | POA: Insufficient documentation

## 2024-04-19 DIAGNOSIS — Z9221 Personal history of antineoplastic chemotherapy: Secondary | ICD-10-CM | POA: Diagnosis not present

## 2024-04-19 DIAGNOSIS — Z79811 Long term (current) use of aromatase inhibitors: Secondary | ICD-10-CM | POA: Diagnosis not present

## 2024-04-19 DIAGNOSIS — D649 Anemia, unspecified: Secondary | ICD-10-CM | POA: Insufficient documentation

## 2024-04-19 LAB — CMP (CANCER CENTER ONLY)
ALT: 14 U/L (ref 0–44)
AST: 19 U/L (ref 15–41)
Albumin: 4.4 g/dL (ref 3.5–5.0)
Alkaline Phosphatase: 79 U/L (ref 38–126)
Anion gap: 6 (ref 5–15)
BUN: 19 mg/dL (ref 8–23)
CO2: 28 mmol/L (ref 22–32)
Calcium: 9 mg/dL (ref 8.9–10.3)
Chloride: 108 mmol/L (ref 98–111)
Creatinine: 0.83 mg/dL (ref 0.44–1.00)
GFR, Estimated: >60 mL/min (ref >60)
Glucose, Bld: 96 mg/dL (ref 70–99)
Potassium: 3.9 mmol/L (ref 3.5–5.1)
Sodium: 142 mmol/L (ref 135–145)
Total Bilirubin: 0.5 mg/dL (ref 0.0–1.2)
Total Protein: 7.3 g/dL (ref 6.5–8.1)

## 2024-04-19 LAB — CBC WITH DIFFERENTIAL (CANCER CENTER ONLY)
Abs Immature Granulocytes: 0.01 K/uL (ref 0.00–0.07)
Basophils Absolute: 0 K/uL (ref 0.0–0.1)
Basophils Relative: 1 %
Eosinophils Absolute: 0.1 K/uL (ref 0.0–0.5)
Eosinophils Relative: 2 %
HCT: 31.8 % — ABNORMAL LOW (ref 36.0–46.0)
Hemoglobin: 10.4 g/dL — ABNORMAL LOW (ref 12.0–15.0)
Immature Granulocytes: 0 %
Lymphocytes Relative: 13 %
Lymphs Abs: 0.5 K/uL — ABNORMAL LOW (ref 0.7–4.0)
MCH: 28.7 pg (ref 26.0–34.0)
MCHC: 32.7 g/dL (ref 30.0–36.0)
MCV: 87.6 fL (ref 80.0–100.0)
Monocytes Absolute: 0.4 K/uL (ref 0.1–1.0)
Monocytes Relative: 11 %
Neutro Abs: 3 K/uL (ref 1.7–7.7)
Neutrophils Relative %: 73 %
Platelet Count: 134 K/uL — ABNORMAL LOW (ref 150–400)
RBC: 3.63 MIL/uL — ABNORMAL LOW (ref 3.87–5.11)
RDW: 14.9 % (ref 11.5–15.5)
WBC Count: 4.1 K/uL (ref 4.0–10.5)
nRBC: 0 % (ref 0.0–0.2)

## 2024-04-19 NOTE — Progress Notes (Signed)
 [facility] Cancer Follow up:    Tiffany Cole, MD [address] [place] [address]   DIAGNOSIS: Cancer Staging  Malignant neoplasm of upper-outer quadrant of right breast in female, estrogen receptor positive (HCC) Staging form: Breast, AJCC 8th Edition - Clinical stage from 06/20/2021: Stage IA (cT1b, cN0, cM0, G1, ER+, PR+, HER2-) - Signed by Tiffany Sandria BROCKS, MD on 06/20/2021 Stage prefix: Initial diagnosis Histologic grading system: 3 grade system Laterality: Right Staged by: Pathologist and managing physician Stage used in treatment planning: Yes National guidelines used in treatment planning: Yes Type of national guideline used in treatment planning: NCCN    SUMMARY OF ONCOLOGIC HISTORY: Oncology History Overview Note   Cancer Staging  Malignant neoplasm of upper-outer quadrant of right breast in female, estrogen receptor positive (HCC) Staging form: Breast, AJCC 8th Edition - Clinical stage from 06/20/2021: Stage IA (cT1b, cN0, cM0, G1, ER+, PR+, HER2-) - Signed by Tiffany Sandria BROCKS, MD on 06/20/2021 Stage prefix: Initial diagnosis Histologic grading system: 3 grade system Laterality: Right Staged by: Pathologist and managing physician Stage used in treatment planning: Yes National guidelines used in treatment planning: Yes Type of national guideline used in treatment planning: NCCN     Malignant neoplasm of upper-outer quadrant of right breast in female, estrogen receptor positive (HCC)  06/15/2021 Initial Diagnosis   Malignant neoplasm of upper-outer quadrant of right breast in female, estrogen receptor positive (HCC)   06/20/2021 Cancer Staging   Staging form: Breast, AJCC 8th Edition - Clinical stage from 06/20/2021: Stage IA (cT1b, cN0, cM0, G1, ER+, PR+, HER2-) - Signed by Tiffany Sandria BROCKS, MD on 06/20/2021 Stage prefix: Initial diagnosis Histologic grading system: 3 grade system Laterality: Right Staged by: Pathologist and  managing physician Stage used in treatment planning: Yes National guidelines used in treatment planning: Yes Type of national guideline used in treatment planning: NCCN   07/09/2021 Genetic Testing   [facility] CustomNext Panel was negative. No pathogenic variants were identified in the 47 genes analyzed. A variant of uncertain significance was detected in the MSH6 gene. Report date is 07/09/2021.  The CustomNext-Cancer+RNAinsight panel offered by [employer] [facility] includes sequencing and rearrangement analysis for the following 47 genes:  APC, ATM, AXIN2, BARD1, BMPR1A, BRCA1, BRCA2, BRIP1, CDH1, CDK4, CDKN2A, CHEK2, DICER1, EPCAM, GREM1, HOXB13, MEN1, MLH1, MSH2, MSH3, MSH6, MUTYH, NBN, NF1, NF2, NTHL1, PALB2, PMS2, POLD1, POLE, PTEN, RAD51C, RAD51D, RECQL, RET, SDHA, SDHAF2, SDHB, SDHC, SDHD, SMAD4, SMARCA4, STK11, TP53, TSC1, TSC2, and VHL.  RNA data is routinely analyzed for use in variant interpretation for all genes.  UPDATE: MSH6 p.T1100M VUS has been amended to Likely Benign. Amended report date is 09/12/2022.   07/12/2021 Surgery   right lumpectomy and sentinel lymph node sampling 07/12/2021 showed a pT1c pN0, stage IA invasive ductal carcinoma, grade 1, with negative margins.             (a) a total of 5 right axillary lymph nodes were removed      07/12/2021 Oncotype testing   Oncotype score of 16 predicts a risk of recurrence outside the breast within the next 9 years of 4% if the patient's only systemic therapy is antiestrogens for 5 years.  It also predicts no significant benefit from chemotherapy.   08/22/2021 - 09/12/2021 Radiation Therapy   08/22/2021 through 09/12/2021 Site Technique Total Dose (Gy) Dose per Fx (Gy) Completed Fx Beam Energies  Breast, Right: Breast_Rt 3D 42.56/42.56 2.66 16/16 6X, 10X     09/2021 -  Anti-estrogen oral therapy   Tamoxifen  daily--d/c after 6 weeks due to vaginal discharge Changed to Anastrozole  11/17/2021     CURRENT THERAPY:  Anastrozole   INTERVAL HISTORY:  Discussed the use of AI scribe software for clinical note transcription with the patient, who gave verbal consent to proceed.  History of Present Illness Tiffany Walsh is a 68 year old female with right-sided stage 1A ERPR positive breast cancer who presents for follow-up after treatment.  She was diagnosed with right-sided stage 1A ERP positive breast cancer in September 2022 and underwent a right lumpectomy followed by adjuvant radiation therapy. She has been on anastrozole  since January 2023, experiencing hot flashes, vaginal dryness, and joint aches, which she manages. Her most recent mammogram on June 10, 2023, showed no evidence of malignancy, with breast density categorized as B. She has not noticed any new breast changes, although one breast appears higher than the other.  She takes olmesartan for blood pressure and B12 vitamins. She takes iron pills four times a week for anemia, with hemoglobin levels fluctuating between 10 and 11, and platelets at 134. No cough, shortness of breath, chest pain, palpitations, or changes in bowel or bladder habits.     Patient Active Problem List   Diagnosis Date Noted   Pure hypercholesterolemia 04/08/2023   Peripheral neuropathy due to chemotherapy (HCC) 04/08/2023   Gastro-esophageal reflux disease without esophagitis 04/08/2023   Genetic testing 06/27/2021   Family history of breast cancer 06/20/2021   Family history of colon cancer 06/20/2021   Malignant neoplasm of upper-outer quadrant of right breast in female, estrogen receptor positive (HCC) 06/15/2021   Hypokalemia    SBO (small bowel obstruction) (HCC) 05/25/2019   Essential hypertension 05/25/2019    has no known allergies.  MEDICAL HISTORY: Past Medical History:  Diagnosis Date   Abdominal pain, LLQ    Anemia    Arthritis of right knee    Cervical dysplasia    CIN I (cervical intraepithelial neoplasia I)    CIN II (cervical  intraepithelial neoplasia II)    Colon cancer (HCC)    Epigastric pain    GERD (gastroesophageal reflux disease)    Heme positive stool    History of rectal cancer    Hx SBO    Hypercholesterolemia    Hypertension    Malignant neoplasm of rectum, rectosigmoid junction, and anus    Nausea with vomiting, unspecified    Peripheral neuropathy due to chemotherapy (HCC)    Rectal cancer (HCC)    stage 3   Vaginal delivery 8026,8024    SURGICAL HISTORY: Past Surgical History:  Procedure Laterality Date   BREAST LUMPECTOMY WITH RADIOACTIVE SEED AND SENTINEL LYMPH NODE BIOPSY Right 07/12/2021   Procedure: RIGHT BREAST LUMPECTOMY WITH RADIOACTIVE SEED AND SENTINEL LYMPH NODE BIOPSY;  Surgeon: Vernetta Berg, MD;  Location: [facility];  Service: General;  Laterality: Right;   CERVICAL CONIZATION W/BX N/A 04/04/2015   Procedure: CONIZATION CERVIX WITH BIOPSY;  Surgeon: Hargis Paradise, MD;  Location: [facility];  Service: Gynecology;  Laterality: N/A;   COLON SURGERY     MOUTH SURGERY     widom teeth       SOCIAL HISTORY: Social History   Socioeconomic History   Marital status: Married    Spouse name: Not on file   Number of children: Not on file   Years of education: Not on file   Highest education level: Not on file  Occupational History   Not on file  Tobacco  Use   Smoking status: Former    Types: Cigarettes   Smokeless tobacco: Never  Vaping Use   Vaping status: Never Used  Substance and Sexual Activity   Alcohol use: Yes    Comment: occasional    Drug use: No   Sexual activity: Not on file  Other Topics Concern   Not on file  Social History Narrative   Not on file   Social Drivers of Health   Financial Resource Strain: Low Risk  (06/20/2021)   Overall Financial Resource Strain (CARDIA)    Difficulty of Paying Living Expenses: Not very hard  Food Insecurity: No Food Insecurity (06/20/2021)   Hunger Vital Sign    Worried About Running Out of Food in the  Last Year: Never true    Ran Out of Food in the Last Year: Never true  Transportation Needs: No Transportation Needs (06/20/2021)   PRAPARE - Administrator, Civil Service (Medical): No    Lack of Transportation (Non-Medical): No  Physical Activity: Not on file  Stress: Not on file  Social Connections: Not on file  Intimate Partner Violence: Not on file    FAMILY HISTORY: Family History  Problem Relation Age of Onset   Cancer Maternal Aunt        unknown type   Breast cancer Maternal Aunt    Colon cancer Maternal Uncle    Lung cancer Maternal Uncle    Cancer Maternal Grandmother        maybe stomach?    Review of Systems  Constitutional:  Negative for appetite change, chills, fatigue, fever and unexpected weight change.  HENT:   Negative for hearing loss, lump/mass and trouble swallowing.   Eyes:  Negative for eye problems and icterus.  Respiratory:  Negative for chest tightness, cough and shortness of breath.   Cardiovascular:  Negative for chest pain, leg swelling and palpitations.  Gastrointestinal:  Negative for abdominal distention, abdominal pain, constipation, diarrhea, nausea and vomiting.  Endocrine: Negative for hot flashes.  Genitourinary:  Negative for difficulty urinating.   Musculoskeletal:  Negative for arthralgias.  Skin:  Negative for itching and rash.  Neurological:  Negative for dizziness, extremity weakness, headaches and numbness.  Hematological:  Negative for adenopathy. Does not bruise/bleed easily.  Psychiatric/Behavioral:  Negative for depression. The patient is not nervous/anxious.       PHYSICAL EXAMINATION    Vitals:   04/19/24 1035  BP: (!) 135/57  Pulse: (!) 53  Resp: 18  Temp: 98.3 F (36.8 C)  SpO2: 100%    Physical Exam Constitutional:      General: She is not in acute distress.    Appearance: Normal appearance. She is not toxic-appearing.  HENT:     Head: Normocephalic and atraumatic.     Mouth/Throat:      Mouth: Mucous membranes are moist.     Pharynx: Oropharynx is clear. No oropharyngeal exudate or posterior oropharyngeal erythema.  Eyes:     General: No scleral icterus. Cardiovascular:     Rate and Rhythm: Normal rate and regular rhythm.     Pulses: Normal pulses.     Heart sounds: Normal heart sounds.  Pulmonary:     Effort: Pulmonary effort is normal.     Breath sounds: Normal breath sounds.  Chest:     Comments: Right breast s/p lumpectomy and radiation, no sign of local recurrence, left breast benign Abdominal:     General: Abdomen is flat. Bowel sounds are normal. There is no distension.  Palpations: Abdomen is soft.     Tenderness: There is no abdominal tenderness.  Musculoskeletal:        General: No swelling.     Cervical back: Neck supple.  Lymphadenopathy:     Cervical: No cervical adenopathy.     Upper Body:     Right upper body: No supraclavicular or axillary adenopathy.     Left upper body: No supraclavicular or axillary adenopathy.  Skin:    General: Skin is warm and dry.     Findings: No rash.  Neurological:     General: No focal deficit present.     Mental Status: She is alert.  Psychiatric:        Mood and Affect: Mood normal.        Behavior: Behavior normal.     LABORATORY DATA:  CBC    Component Value Date/Time   WBC 4.1 04/19/2024 1017   WBC 5.9 09/30/2022 1030   RBC 3.63 (L) 04/19/2024 1017   HGB 10.4 (L) 04/19/2024 1017   HGB 9.1 (L) 05/09/2015 0923   HCT 31.8 (L) 04/19/2024 1017   HCT 29.0 (L) 05/09/2015 0923   PLT 134 (L) 04/19/2024 1017   PLT 194 05/09/2015 0923   MCV 87.6 04/19/2024 1017   MCV 79.1 (L) 05/09/2015 0923   MCH 28.7 04/19/2024 1017   MCHC 32.7 04/19/2024 1017   RDW 14.9 04/19/2024 1017   RDW 17.1 (H) 05/09/2015 0923   LYMPHSABS 0.5 (L) 04/19/2024 1017   LYMPHSABS 1.0 05/09/2015 0923   MONOABS 0.4 04/19/2024 1017   MONOABS 0.3 05/09/2015 0923   EOSABS 0.1 04/19/2024 1017   EOSABS 0.1 05/09/2015 0923   BASOSABS  0.0 04/19/2024 1017   BASOSABS 0.0 05/09/2015 0923    CMP     Component Value Date/Time   NA 144 10/03/2023 1013   NA 144 05/26/2020 0939   NA 144 05/09/2015 0923   K 3.3 (L) 10/03/2023 1013   K 3.9 05/09/2015 0923   CL 105 10/03/2023 1013   CL 101 10/27/2012 0913   CO2 31 10/03/2023 1013   CO2 26 05/09/2015 0923   GLUCOSE 95 10/03/2023 1013   GLUCOSE 92 05/09/2015 0923   GLUCOSE 83 10/27/2012 0913   BUN 12 10/03/2023 1013   BUN 11 05/26/2020 0939   BUN 12.6 05/09/2015 0923   CREATININE 0.70 10/03/2023 1013   CREATININE 0.8 05/09/2015 0923   CALCIUM  9.1 10/03/2023 1013   CALCIUM  8.5 05/09/2015 0923   PROT 7.2 10/03/2023 1013   PROT 6.9 05/09/2015 0923   ALBUMIN 4.4 10/03/2023 1013   ALBUMIN 3.7 05/09/2015 0923   AST 20 10/03/2023 1013   AST 13 05/09/2015 0923   ALT 12 10/03/2023 1013   ALT 10 05/09/2015 0923   ALKPHOS 83 10/03/2023 1013   ALKPHOS 76 05/09/2015 0923   BILITOT 0.5 10/03/2023 1013   BILITOT 0.27 05/09/2015 0923   GFRNONAA >60 10/03/2023 1013   GFRAA >60 06/06/2020 2141     ASSESSMENT and THERAPY PLAN:  Assessment and Plan Assessment & Plan Stage 1A ER-positive breast cancer 68 year old woman with right-sided stage 1A ER-positive breast cancer, post-lumpectomy and adjuvant radiation. On anastrozole  since January 2023. No mammographic evidence of malignancy as of September 2024. No new breast changes or concerns. - Continue anastrozole  therapy. - Perform annual mammogram in September. - Encourage diet with 4-6 servings of fruits and vegetables daily. - Recommend 150-300 minutes of exercise per week.  Chronic anemia Chronic anemia with hemoglobin levels between 10  and 11, currently 10.42. Platelet count is 134. Iron supplementation ongoing. GI follow-up well-managed. - Continue iron supplementation four times a week. - Follow up with GI specialist as needed.  RTC in 6 months for f/u with Dr. Lanny as scheduled.     All questions were answered.  The patient knows to call the clinic with any problems, questions or concerns. We can certainly see the patient much sooner if necessary.  Total encounter time:20 minutes*in face-to-face visit time, chart review, lab review, care coordination, order entry, and documentation of the encounter time.    Morna Kendall, NP 04/19/24 10:42 AM Medical Oncology and Hematology [facility] [address] [place], [place] [address] Tel. [phone]    Fax. [phone]  *Total Encounter Time as defined by the Centers for Medicare and Medicaid Services includes, in addition to the face-to-face time of a patient visit (documented in the note above) non-face-to-face time: obtaining and reviewing outside history, ordering and reviewing medications, tests or procedures, care coordination (communications with other health care professionals or caregivers) and documentation in the medical record.

## 2024-04-22 DIAGNOSIS — Z85048 Personal history of other malignant neoplasm of rectum, rectosigmoid junction, and anus: Secondary | ICD-10-CM | POA: Diagnosis not present

## 2024-04-22 DIAGNOSIS — C50411 Malignant neoplasm of upper-outer quadrant of right female breast: Secondary | ICD-10-CM | POA: Diagnosis not present

## 2024-04-22 DIAGNOSIS — I10 Essential (primary) hypertension: Secondary | ICD-10-CM | POA: Diagnosis not present

## 2024-04-22 DIAGNOSIS — E7800 Pure hypercholesterolemia, unspecified: Secondary | ICD-10-CM | POA: Diagnosis not present

## 2024-05-23 DIAGNOSIS — E7800 Pure hypercholesterolemia, unspecified: Secondary | ICD-10-CM | POA: Diagnosis not present

## 2024-05-23 DIAGNOSIS — Z85048 Personal history of other malignant neoplasm of rectum, rectosigmoid junction, and anus: Secondary | ICD-10-CM | POA: Diagnosis not present

## 2024-05-23 DIAGNOSIS — C50411 Malignant neoplasm of upper-outer quadrant of right female breast: Secondary | ICD-10-CM | POA: Diagnosis not present

## 2024-05-23 DIAGNOSIS — I10 Essential (primary) hypertension: Secondary | ICD-10-CM | POA: Diagnosis not present

## 2024-05-25 DIAGNOSIS — D649 Anemia, unspecified: Secondary | ICD-10-CM | POA: Diagnosis not present

## 2024-05-25 DIAGNOSIS — C50411 Malignant neoplasm of upper-outer quadrant of right female breast: Secondary | ICD-10-CM | POA: Diagnosis not present

## 2024-05-25 DIAGNOSIS — B351 Tinea unguium: Secondary | ICD-10-CM | POA: Diagnosis not present

## 2024-05-25 DIAGNOSIS — Z0000 Encounter for general adult medical examination without abnormal findings: Secondary | ICD-10-CM | POA: Diagnosis not present

## 2024-05-25 DIAGNOSIS — R7303 Prediabetes: Secondary | ICD-10-CM | POA: Diagnosis not present

## 2024-05-25 DIAGNOSIS — E538 Deficiency of other specified B group vitamins: Secondary | ICD-10-CM | POA: Diagnosis not present

## 2024-05-25 DIAGNOSIS — E7800 Pure hypercholesterolemia, unspecified: Secondary | ICD-10-CM | POA: Diagnosis not present

## 2024-05-25 DIAGNOSIS — E559 Vitamin D deficiency, unspecified: Secondary | ICD-10-CM | POA: Diagnosis not present

## 2024-05-25 DIAGNOSIS — K219 Gastro-esophageal reflux disease without esophagitis: Secondary | ICD-10-CM | POA: Diagnosis not present

## 2024-05-25 DIAGNOSIS — I10 Essential (primary) hypertension: Secondary | ICD-10-CM | POA: Diagnosis not present

## 2024-06-10 DIAGNOSIS — R92323 Mammographic fibroglandular density, bilateral breasts: Secondary | ICD-10-CM | POA: Diagnosis not present

## 2024-06-10 DIAGNOSIS — R928 Other abnormal and inconclusive findings on diagnostic imaging of breast: Secondary | ICD-10-CM | POA: Diagnosis not present

## 2024-06-14 ENCOUNTER — Ambulatory Visit: Attending: Surgery

## 2024-06-14 VITALS — Wt 117.4 lb

## 2024-06-14 DIAGNOSIS — Z483 Aftercare following surgery for neoplasm: Secondary | ICD-10-CM | POA: Insufficient documentation

## 2024-06-14 NOTE — Therapy (Signed)
 OUTPATIENT PHYSICAL THERAPY SOZO SCREENING NOTE   Patient Name: Tiffany Walsh MRN: [id number] DOB:Jun 20, 1956, 68 y.o., female Today's Date: 06/14/2024  PCP: [name], MD REFERRING PROVIDER: Vernetta Berg, MD   PT End of Session - 06/14/24 1040     Visit Number 6   # unchanged due to screen only   PT Start Time 1038    PT Stop Time 1042    PT Time Calculation (min) 4 min    Activity Tolerance Patient tolerated treatment well    Behavior During Therapy WFL for tasks assessed/performed          Past Medical History:  Diagnosis Date   Abdominal pain, LLQ    Anemia    Arthritis of right knee    Cervical dysplasia    CIN I (cervical intraepithelial neoplasia I)    CIN II (cervical intraepithelial neoplasia II)    Colon cancer (HCC)    Epigastric pain    GERD (gastroesophageal reflux disease)    Heme positive stool    History of rectal cancer    Hx SBO    Hypercholesterolemia    Hypertension    Malignant neoplasm of rectum, rectosigmoid junction, and anus    Nausea with vomiting, unspecified    Peripheral neuropathy due to chemotherapy (HCC)    Rectal cancer (HCC)    stage 3   Vaginal delivery 8026,8024   Past Surgical History:  Procedure Laterality Date   BREAST LUMPECTOMY WITH RADIOACTIVE SEED AND SENTINEL LYMPH NODE BIOPSY Right 07/12/2021   Procedure: RIGHT BREAST LUMPECTOMY WITH RADIOACTIVE SEED AND SENTINEL LYMPH NODE BIOPSY;  Surgeon: Vernetta Berg, MD;  Location: [facility];  Service: General;  Laterality: Right;   CERVICAL CONIZATION W/BX N/A 04/04/2015   Procedure: CONIZATION CERVIX WITH BIOPSY;  Surgeon: Hargis Paradise, MD;  Location: [facility];  Service: Gynecology;  Laterality: N/A;   COLON SURGERY     MOUTH SURGERY     widom teeth      Patient Active Problem List   Diagnosis Date Noted   Pure hypercholesterolemia 04/08/2023   Peripheral neuropathy due to chemotherapy (HCC) 04/08/2023   Gastro-esophageal reflux disease without  esophagitis 04/08/2023   Genetic testing 06/27/2021   Family history of breast cancer 06/20/2021   Family history of colon cancer 06/20/2021   Malignant neoplasm of upper-outer quadrant of right breast in female, estrogen receptor positive (HCC) 06/15/2021   Hypokalemia    SBO (small bowel obstruction) (HCC) 05/25/2019   Essential hypertension 05/25/2019    REFERRING DIAG: right breast cancer at risk for lymphedema  THERAPY DIAG: Aftercare following surgery for neoplasm  PERTINENT HISTORY: Patient was diagnosed on 05/23/2021 with right grade I invasive ductal carcinoma breast cancer.She underwent a right lumpectomy and sentinel node biopsy (5 negative nodes) on 07/12/2021. It is ER/PR positive and HER2 negative with a Ki67 of 10%.   PRECAUTIONS: right UE Lymphedema risk, None  SUBJECTIVE: Pt returns for her 3 month L-Dex screen.   PAIN:  Are you having pain? No  SOZO SCREENING: Patient was assessed today using the SOZO machine to determine the lymphedema index score. This was compared to her baseline score. It was determined that she is within the recommended range when compared to her baseline and no further action is needed at this time. She will continue SOZO screenings. These are done every 3 months for 2 years post operatively followed by every 6 months for 2 years, and then annually.   L-DEX FLOWSHEETS - 06/14/24 1000  L-DEX LYMPHEDEMA SCREENING   Measurement Type Unilateral    L-DEX MEASUREMENT EXTREMITY Upper Extremity    POSITION  Standing    DOMINANT SIDE Right    At Risk Side Right    BASELINE SCORE (UNILATERAL) -1.7    L-DEX SCORE (UNILATERAL) -1    VALUE CHANGE (UNILAT) 0.7           P: Cont 6 month L-Dex screens.     Aden Berwyn Caldron, PTA 06/14/2024, 10:41 AM

## 2024-06-18 ENCOUNTER — Encounter: Payer: Self-pay | Admitting: Hematology

## 2024-06-22 ENCOUNTER — Other Ambulatory Visit: Payer: Self-pay | Admitting: Cardiology

## 2024-06-22 DIAGNOSIS — Z85048 Personal history of other malignant neoplasm of rectum, rectosigmoid junction, and anus: Secondary | ICD-10-CM | POA: Diagnosis not present

## 2024-06-22 DIAGNOSIS — I10 Essential (primary) hypertension: Secondary | ICD-10-CM | POA: Diagnosis not present

## 2024-06-22 DIAGNOSIS — E7800 Pure hypercholesterolemia, unspecified: Secondary | ICD-10-CM | POA: Diagnosis not present

## 2024-06-22 DIAGNOSIS — C50411 Malignant neoplasm of upper-outer quadrant of right female breast: Secondary | ICD-10-CM | POA: Diagnosis not present

## 2024-06-22 DIAGNOSIS — E785 Hyperlipidemia, unspecified: Secondary | ICD-10-CM

## 2024-06-22 DIAGNOSIS — I2510 Atherosclerotic heart disease of native coronary artery without angina pectoris: Secondary | ICD-10-CM

## 2024-07-12 NOTE — Progress Notes (Unsigned)
 Cardiology Office Note   Date:  07/16/2024  ID:  Tiffany Walsh, DOB [birth year], MRN [id number] PCP: [name], MD  [facility] Providers Cardiologist:  Oneil Parchment, MD     History of Present Illness Tiffany Walsh is a 68 y.o. female with a past medical history of nonobstructive CAD, hypertension, GERD, history of breast cancer, dyslipidemia, prior tobacco abuse.  09/26/2021 echo EF 55 to 60%, grade 1 DD 09/19/2021 carotid duplex minimal plaque, near normal 05/31/2020 coronary CT calcium  score 114, 88th percentile, focal dense calcified plaque in the proximal LAD that is not flow-limiting  She established care with Dr. Parchment in 2021 at the behest of her PCP for evaluation of chest pain.  A coronary CTA was arranged revealing a calcium  score of 114, nonobstructive CAD.  In 2022 she had a carotid duplex revealing nearly normal carotid arteries.  Most recently she was evaluated by Dr. Parchment on 07/03/2023, she was stable from a cardiac perspective, very physically active working out most days of the week, no changes made to medications or plan of care and she was advised she can follow-up in 1 year.  She presents today for follow-up, she has no formal complaints from a cardiac perspective and has been doing well since last evaluated in our office.  She  continues to remain physically active, working out most days of the week.  She mention she was recently told maybe her A1c was increased and so she has been working on making lifestyle changes regarding diet.  Her blood pressure is well-controlled.  She is bradycardic on her EKG but this is overall unchanged and she is asymptomatic. She denies chest pain, palpitations, dyspnea, pnd, orthopnea, n, v, dizziness, syncope, edema, weight gain, or early satiety.   ROS: Review of Systems  All other systems reviewed and are negative.    Studies Reviewed EKG Interpretation Date/Time:  Friday July 16 2024 10:38:40 EDT Ventricular Rate:   48 PR Interval:  162 QRS Duration:  80 QT Interval:  474 QTC Calculation: 423 R Axis:   14  Text Interpretation: Sinus bradycardia with sinus arrhythmia When compared with ECG of 30-Mar-2015 13:27, T wave inversion less evident in Anterior leads Confirmed by Carlin Nest [phone]) on 07/16/2024 10:48:15 AM    Cardiac Studies & Procedures   ______________________________________________________________________________________________     ECHOCARDIOGRAM  ECHOCARDIOGRAM COMPLETE 09/26/2021  Narrative ECHOCARDIOGRAM REPORT    Patient Name:   Tiffany Walsh Date of Exam: 09/26/2021 Medical Rec #:  [id number]        Height:       65.0 in Accession #:    [id number]       Weight:       125.7 lb Date of Birth:  Feb 11, 1956         BSA:          1.624 m Patient Age:    65 years         BP:           122/68 mmHg Patient Gender: F                HR:           47 bpm. Exam Location:  Outpatient  Procedure: 2D Echo and 3D Echo  Indications:    Syncope R55  History:        Patient has no prior history of Echocardiogram examinations. Risk Factors:Hypertension.  Sonographer:    Augustin Seals RDCS Referring Phys: [phone] ELI  HAMMER  IMPRESSIONS   1. Left ventricular ejection fraction, by estimation, is 55 to 60%. Left ventricular ejection fraction by 3D volume is 59 %. The left ventricle has normal function. The left ventricle has no regional wall motion abnormalities. Left ventricular diastolic parameters are consistent with Grade I diastolic dysfunction (impaired relaxation). 2. Right ventricular systolic function is normal. The right ventricular size is normal. Tricuspid regurgitation signal is inadequate for assessing PA pressure. 3. The mitral valve is normal in structure. No evidence of mitral valve regurgitation. 4. The aortic valve is tricuspid. Aortic valve regurgitation is not visualized. No aortic stenosis is present. 5. The inferior vena cava is normal in size with greater  than 50% respiratory variability, suggesting right atrial pressure of 3 mmHg.  Comparison(s): No prior Echocardiogram.  FINDINGS Left Ventricle: Left ventricular ejection fraction, by estimation, is 55 to 60%. Left ventricular ejection fraction by 3D volume is 59 %. The left ventricle has normal function. The left ventricle has no regional wall motion abnormalities. The left ventricular internal cavity size was normal in size. There is no left ventricular hypertrophy. Left ventricular diastolic parameters are consistent with Grade I diastolic dysfunction (impaired relaxation).  Right Ventricle: The right ventricular size is normal. No increase in right ventricular wall thickness. Right ventricular systolic function is normal. Tricuspid regurgitation signal is inadequate for assessing PA pressure.  Left Atrium: Left atrial size was normal in size.  Right Atrium: Right atrial size was normal in size.  Pericardium: There is no evidence of pericardial effusion.  Mitral Valve: The mitral valve is normal in structure. No evidence of mitral valve regurgitation.  Tricuspid Valve: The tricuspid valve is normal in structure. Tricuspid valve regurgitation is trivial. No evidence of tricuspid stenosis.  Aortic Valve: The aortic valve is tricuspid. Aortic valve regurgitation is not visualized. No aortic stenosis is present.  Pulmonic Valve: The pulmonic valve was grossly normal. Pulmonic valve regurgitation is mild. No evidence of pulmonic stenosis.  Aorta: The aortic root and ascending aorta are structurally normal, with no evidence of dilitation.  Venous: The inferior vena cava is normal in size with greater than 50% respiratory variability, suggesting right atrial pressure of 3 mmHg.  IAS/Shunts: No atrial level shunt detected by color flow Doppler.   LEFT VENTRICLE PLAX 2D LVIDd:         4.90 cm         Diastology LVIDs:         3.00 cm         LV e' medial:    6.31 cm/s LV PW:         0.80  cm         LV E/e' medial:  14.6 LV IVS:        0.90 cm         LV e' lateral:   8.38 cm/s LVOT diam:     2.10 cm         LV E/e' lateral: 11.0 LV SV:         74 LV SV Index:   46 LVOT Area:     3.46 cm        3D Volume EF LV 3D EF:    Left ventricul ar ejection fraction by 3D volume is 59 %.  3D Volume EF: 3D EF:        59 % LV EDV:       116 ml LV ESV:       48 ml LV  SV:        68 ml  RIGHT VENTRICLE RV S prime:     12.10 cm/s  LEFT ATRIUM             Index        RIGHT ATRIUM           Index LA diam:        2.50 cm 1.54 cm/m   RA Area:     17.10 cm LA Vol (A2C):   49.9 ml 30.73 ml/m  RA Volume:   45.30 ml  27.90 ml/m LA Vol (A4C):   20.4 ml 12.56 ml/m LA Biplane Vol: 32.7 ml 20.14 ml/m AORTIC VALVE LVOT Vmax:   89.10 cm/s LVOT Vmean:  54.000 cm/s LVOT VTI:    0.214 m  AORTA Ao Root diam: 2.90 cm  MITRAL VALVE MV Area (PHT): 2.87 cm    SHUNTS MV Decel Time: 264 msec    Systemic VTI:  0.21 m MV E velocity: 92.20 cm/s  Systemic Diam: 2.10 cm MV A velocity: 96.90 cm/s MV E/A ratio:  0.95  Stanly Leavens MD Electronically signed by Stanly Leavens MD Signature Date/Time: 09/26/2021/4:03:36 PM    Final      CT SCANS  CT CORONARY MORPH W/CTA COR W/SCORE 05/31/2020  Addendum 05/31/2020  3:39 PM ADDENDUM REPORT: 05/31/2020 15:34  CLINICAL DATA:  68 year old female with chest pain  EXAM: Cardiac/Coronary  CTA  TECHNIQUE: The patient was scanned on a [employer].  FINDINGS: A 120 kV prospective scan was triggered in the descending thoracic aorta at 111 HU's. Axial non-contrast 3 mm slices were carried out through the heart. The data set was analyzed on a dedicated work station and scored using the Agatson method. Gantry rotation speed was 250 msecs and collimation was .6 mm. Beta blockade and 0.8 mg of sl NTG was given. The 3D data set was reconstructed in 5% intervals of the 67-82 % of the R-R cycle. Diastolic phases were  analyzed on a dedicated work station using MPR, MIP and VRT modes. The patient received 80 cc of contrast.  Aorta: Normal size. Very small area of aortic root calcification/ atherosclerosis. No dissection.  Aortic Valve:  Trileaflet.  No calcifications.  Coronary Arteries:  Normal coronary origin.  Right dominance.  RCA is a large dominant artery that gives rise to PDA and PLA. There is no plaque.  Left main is a large artery that gives rise to LAD and LCX arteries.  LAD is a large vessel with a focal dense calcified proximal LAD plaque, 0-24% stenosis, non flow limiting.  LCX is a non-dominant artery that gives rise to one large OM1 branch. There is no plaque.  Other findings:  Normal pulmonary vein drainage into the left atrium.  Normal left atrial appendage without a thrombus.  Normal size of the pulmonary artery.  Please see radiology report for non cardiac findings.  IMPRESSION: 1. Coronary calcium  score of 114. This was 78 percentile for age and sex matched control. (Calcified plaque in proximal LAD)  2. Normal coronary origin with right dominance.  3. There is a focal dense calcified proximal LAD plaque, 0-24% stenosis, non flow limiting.  4. Very small area of aortic root calcification/ atherosclerosis.  5. CAD-RADS 1. Minimal non-obstructive CAD (0-24%). Consider non-atherosclerotic causes of chest pain. Consider preventive therapy and risk factor modification.  Oneil Parchment, MD [facility]   Electronically Signed By: Oneil Parchment MD On: 05/31/2020 15:34  Narrative EXAM: OVER-READ  INTERPRETATION  CT CHEST  The following report is an over-read performed by radiologist Dr. Toribio Aye of [facility] on 05/31/2020. This over-read does not include interpretation of cardiac or coronary anatomy or pathology. The coronary calcium  score/coronary CTA interpretation by the cardiologist is attached.  COMPARISON:  None.  FINDINGS: Aortic  atherosclerosis. Within the visualized portions of the thorax there are no suspicious appearing pulmonary nodules or masses, there is no acute consolidative airspace disease, no pleural effusions, no pneumothorax and no lymphadenopathy. Visualized portions of the upper abdomen demonstrates a large 6.7 x 6.2 cm low-attenuation lesion in the right lobe of the liver, compatible with a simple cyst. There are no aggressive appearing lytic or blastic lesions noted in the visualized portions of the skeleton.  IMPRESSION: 1.  Aortic Atherosclerosis (ICD10-I70.0). 2. Large cyst in the right lobe of the liver incidentally noted.  Electronically Signed: By: Toribio Aye M.D. On: 05/31/2020 12:02     ______________________________________________________________________________________________      Risk Assessment/Calculations           Physical Exam VS:  BP 121/74   Pulse (!) 57   Ht 5' 5 (1.651 m)   Wt 118 lb 12.8 oz (53.9 kg)   BMI 19.77 kg/m        Wt Readings from Last 3 Encounters:  07/16/24 118 lb 12.8 oz (53.9 kg)  06/14/24 117 lb 6 oz (53.2 kg)  04/19/24 117 lb 8 oz (53.3 kg)    GEN: Fit, appears younger than stated age  NECK: No JVD; No carotid bruits CARDIAC: RRR, no murmurs, rubs, gallops RESPIRATORY:  Clear to auscultation without rales, wheezing or rhonchi  ABDOMEN: Soft, non-tender, non-distended EXTREMITIES:  No edema; No deformity   ASSESSMENT AND PLAN CAD - 2021 coronary CT calcium  score 114, 88th percentile, focal dense calcified plaque in the proximal LAD that is not flow-limiting.  Continue aspirin  81 mg daily, Crestor  20 mg daily, metoprolol  50 mg daily. Stable with no anginal symptoms. No indication for ischemic evaluation.  Heart healthy diet and regular cardiovascular exercise encouraged.    Bradycardia-heart rate is 57, EKG is unchanged and she is asymptomatic, continue metoprolol  50 mg daily.  Dyslipidemia -no lipid panel on file for review, will  repeat c-Met, fasting lipid panel as well as a LP(a), she is currently on Crestor  20 mg daily and if her LP(a) is elevated then we will plan on increasing her Crestor .  Hypertension -her blood pressure is well-controlled at 121/74, continue Benicar 20 mg daily.  Screening for diabetes - will check her A1C        Dispo: CMET, fasting lipid panel, LP(a), A1c, follow-up in 1 year with Dr. Jeffrie.  Signed, Delon JAYSON Hoover, NP

## 2024-07-16 ENCOUNTER — Ambulatory Visit: Attending: Cardiology | Admitting: Cardiology

## 2024-07-16 ENCOUNTER — Encounter: Payer: Self-pay | Admitting: Cardiology

## 2024-07-16 VITALS — BP 121/74 | HR 57 | Ht 65.0 in | Wt 118.8 lb

## 2024-07-16 DIAGNOSIS — I10 Essential (primary) hypertension: Secondary | ICD-10-CM | POA: Diagnosis not present

## 2024-07-16 DIAGNOSIS — Z131 Encounter for screening for diabetes mellitus: Secondary | ICD-10-CM

## 2024-07-16 DIAGNOSIS — E782 Mixed hyperlipidemia: Secondary | ICD-10-CM

## 2024-07-16 DIAGNOSIS — I2510 Atherosclerotic heart disease of native coronary artery without angina pectoris: Secondary | ICD-10-CM

## 2024-07-16 NOTE — Patient Instructions (Signed)
 Medication Instructions:   Your physician recommends that you continue on your current medications as directed. Please refer to the Current Medication list given to you today.  *If you need a refill on your cardiac medications before your next appointment, please call your pharmacy*  Lab Work:   PLEASE GO DOWN STAIRS  [facility]  FIRST FLOOR   ( GET OFF ELEVATORS WALK TOWARDS WAITING AREA LAB LOCATED BY PHARMACY):  CMET  LIPID LPA A1C  TODAY    If you have labs (blood work) drawn today and your tests are completely normal, you will receive your results only by: MyChart Message (if you have MyChart) OR A paper copy in the mail If you have any lab test that is abnormal or we need to change your treatment, we will call you to review the results.  Testing/Procedures: NONE ORDERED  TODAY    Follow-Up: At [facility], you and your health needs are our priority.  As part of our continuing mission to provide you with exceptional heart care, our providers are all part of one team.  This team includes your primary Cardiologist (physician) and Advanced Practice Providers or APPs (Physician Assistants and Nurse Practitioners) who all work together to provide you with the care you need, when you need it.  Your next appointment:   1 year(s)  Provider:   Oneil Parchment, MD    We recommend signing up for the patient portal called MyChart.  Sign up information is provided on this After Visit Summary.  MyChart is used to connect with patients for Virtual Visits (Telemedicine).  Patients are able to view lab/test results, encounter notes, upcoming appointments, etc.  Non-urgent messages can be sent to your provider as well.   To learn more about what you can do with MyChart, go to [email or web address].   Other Instructions

## 2024-07-19 ENCOUNTER — Ambulatory Visit: Payer: Self-pay | Admitting: Cardiology

## 2024-07-19 ENCOUNTER — Other Ambulatory Visit: Payer: Self-pay | Admitting: Cardiology

## 2024-07-19 DIAGNOSIS — E785 Hyperlipidemia, unspecified: Secondary | ICD-10-CM

## 2024-07-19 DIAGNOSIS — I2510 Atherosclerotic heart disease of native coronary artery without angina pectoris: Secondary | ICD-10-CM

## 2024-07-19 LAB — LIPID PANEL
Chol/HDL Ratio: 1.6 ratio (ref 0.0–4.4)
Cholesterol, Total: 147 mg/dL (ref 100–199)
HDL: 91 mg/dL
LDL Chol Calc (NIH): 45 mg/dL (ref 0–99)
Triglycerides: 47 mg/dL (ref 0–149)
VLDL Cholesterol Cal: 11 mg/dL (ref 5–40)

## 2024-07-19 LAB — LIPOPROTEIN A (LPA): Lipoprotein (a): 28.5 nmol/L

## 2024-07-19 LAB — HEMOGLOBIN A1C
Est. average glucose Bld gHb Est-mCnc: 105 mg/dL
Hgb A1c MFr Bld: 5.3 % (ref 4.8–5.6)

## 2024-07-23 DIAGNOSIS — Z85048 Personal history of other malignant neoplasm of rectum, rectosigmoid junction, and anus: Secondary | ICD-10-CM | POA: Diagnosis not present

## 2024-07-23 DIAGNOSIS — I10 Essential (primary) hypertension: Secondary | ICD-10-CM | POA: Diagnosis not present

## 2024-07-23 DIAGNOSIS — E7800 Pure hypercholesterolemia, unspecified: Secondary | ICD-10-CM | POA: Diagnosis not present

## 2024-07-23 DIAGNOSIS — C50411 Malignant neoplasm of upper-outer quadrant of right female breast: Secondary | ICD-10-CM | POA: Diagnosis not present

## 2024-07-28 DIAGNOSIS — H524 Presbyopia: Secondary | ICD-10-CM | POA: Diagnosis not present

## 2024-07-28 DIAGNOSIS — H5203 Hypermetropia, bilateral: Secondary | ICD-10-CM | POA: Diagnosis not present

## 2024-07-28 DIAGNOSIS — H52223 Regular astigmatism, bilateral: Secondary | ICD-10-CM | POA: Diagnosis not present

## 2024-07-29 DIAGNOSIS — E538 Deficiency of other specified B group vitamins: Secondary | ICD-10-CM | POA: Diagnosis not present

## 2024-07-29 DIAGNOSIS — K2100 Gastro-esophageal reflux disease with esophagitis, without bleeding: Secondary | ICD-10-CM | POA: Diagnosis not present

## 2024-07-29 DIAGNOSIS — D649 Anemia, unspecified: Secondary | ICD-10-CM | POA: Diagnosis not present

## 2024-07-29 DIAGNOSIS — K7689 Other specified diseases of liver: Secondary | ICD-10-CM | POA: Diagnosis not present

## 2024-07-29 DIAGNOSIS — K31A0 Gastric intestinal metaplasia, unspecified: Secondary | ICD-10-CM | POA: Diagnosis not present

## 2024-07-29 DIAGNOSIS — Z85048 Personal history of other malignant neoplasm of rectum, rectosigmoid junction, and anus: Secondary | ICD-10-CM | POA: Diagnosis not present

## 2024-07-29 DIAGNOSIS — Z860101 Personal history of adenomatous and serrated colon polyps: Secondary | ICD-10-CM | POA: Diagnosis not present

## 2024-08-22 DIAGNOSIS — E7800 Pure hypercholesterolemia, unspecified: Secondary | ICD-10-CM | POA: Diagnosis not present

## 2024-08-22 DIAGNOSIS — Z85048 Personal history of other malignant neoplasm of rectum, rectosigmoid junction, and anus: Secondary | ICD-10-CM | POA: Diagnosis not present

## 2024-08-22 DIAGNOSIS — C50411 Malignant neoplasm of upper-outer quadrant of right female breast: Secondary | ICD-10-CM | POA: Diagnosis not present

## 2024-08-22 DIAGNOSIS — I10 Essential (primary) hypertension: Secondary | ICD-10-CM | POA: Diagnosis not present

## 2024-08-24 DIAGNOSIS — Z85048 Personal history of other malignant neoplasm of rectum, rectosigmoid junction, and anus: Secondary | ICD-10-CM | POA: Diagnosis not present

## 2024-08-24 DIAGNOSIS — K529 Noninfective gastroenteritis and colitis, unspecified: Secondary | ICD-10-CM | POA: Diagnosis not present

## 2024-08-24 DIAGNOSIS — Z08 Encounter for follow-up examination after completed treatment for malignant neoplasm: Secondary | ICD-10-CM | POA: Diagnosis not present

## 2024-08-24 DIAGNOSIS — Z980 Intestinal bypass and anastomosis status: Secondary | ICD-10-CM | POA: Diagnosis not present

## 2024-08-24 DIAGNOSIS — K644 Residual hemorrhoidal skin tags: Secondary | ICD-10-CM | POA: Diagnosis not present

## 2024-08-24 DIAGNOSIS — K635 Polyp of colon: Secondary | ICD-10-CM | POA: Diagnosis not present

## 2024-08-24 DIAGNOSIS — K6389 Other specified diseases of intestine: Secondary | ICD-10-CM | POA: Diagnosis not present

## 2024-08-27 DIAGNOSIS — K635 Polyp of colon: Secondary | ICD-10-CM | POA: Diagnosis not present

## 2024-08-27 DIAGNOSIS — K529 Noninfective gastroenteritis and colitis, unspecified: Secondary | ICD-10-CM | POA: Diagnosis not present

## 2024-09-01 DIAGNOSIS — H25813 Combined forms of age-related cataract, bilateral: Secondary | ICD-10-CM | POA: Diagnosis not present

## 2024-09-22 ENCOUNTER — Other Ambulatory Visit: Payer: Self-pay | Admitting: Hematology

## 2024-10-04 ENCOUNTER — Ambulatory Visit: Payer: Medicare HMO | Admitting: Hematology

## 2024-10-04 ENCOUNTER — Other Ambulatory Visit: Payer: Medicare HMO

## 2024-10-19 ENCOUNTER — Other Ambulatory Visit: Payer: Self-pay | Admitting: Nurse Practitioner

## 2024-10-19 ENCOUNTER — Other Ambulatory Visit: Payer: Self-pay

## 2024-10-19 DIAGNOSIS — Z170 Estrogen receptor positive status [ER+]: Secondary | ICD-10-CM

## 2024-10-19 NOTE — Assessment & Plan Note (Addendum)
 stage IA p(T1c, N0), ER+/PR+/Her2-, Grade 1, RS 16 -found on screening mammogram. Biopsy on 06/11/21 showed invasive ductal carcinoma, low grade. -right lumpectomy on 07/12/21 by Dr. Vernetta. Pathology showed 1.5 cm invasive and in situ ductal carcinoma. Margins and lymph nodes negative. -she received radiation 11/20-12/21/22 under Dr. Izell -she started anastrazole in 10/2021, continues to tolerate well. -Diagnostic mammogram in September 2025 was benign. - Continue breast cancer surveillance with labs and follow-up in 6 months, sooner if needed.

## 2024-10-20 ENCOUNTER — Inpatient Hospital Stay: Admitting: Nurse Practitioner

## 2024-10-20 ENCOUNTER — Inpatient Hospital Stay: Attending: Nurse Practitioner

## 2024-10-20 VITALS — BP 129/80 | HR 63 | Temp 96.8°F | Resp 16 | Wt 115.9 lb

## 2024-10-20 DIAGNOSIS — Z170 Estrogen receptor positive status [ER+]: Secondary | ICD-10-CM

## 2024-10-20 DIAGNOSIS — C50411 Malignant neoplasm of upper-outer quadrant of right female breast: Secondary | ICD-10-CM | POA: Diagnosis not present

## 2024-10-20 LAB — CMP (CANCER CENTER ONLY)
ALT: 15 U/L (ref 0–44)
AST: 22 U/L (ref 15–41)
Albumin: 4.7 g/dL (ref 3.5–5.0)
Alkaline Phosphatase: 78 U/L (ref 38–126)
Anion gap: 11 (ref 5–15)
BUN: 18 mg/dL (ref 8–23)
CO2: 25 mmol/L (ref 22–32)
Calcium: 9.4 mg/dL (ref 8.9–10.3)
Chloride: 107 mmol/L (ref 98–111)
Creatinine: 0.73 mg/dL (ref 0.44–1.00)
GFR, Estimated: >60 mL/min
Glucose, Bld: 101 mg/dL — ABNORMAL HIGH (ref 70–99)
Potassium: 3.9 mmol/L (ref 3.5–5.1)
Sodium: 144 mmol/L (ref 135–145)
Total Bilirubin: 0.6 mg/dL (ref 0.0–1.2)
Total Protein: 7.4 g/dL (ref 6.5–8.1)

## 2024-10-20 LAB — CBC (CANCER CENTER ONLY)
HCT: 33.6 % — ABNORMAL LOW (ref 36.0–46.0)
Hemoglobin: 10.9 g/dL — ABNORMAL LOW (ref 12.0–15.0)
MCH: 28.5 pg (ref 26.0–34.0)
MCHC: 32.4 g/dL (ref 30.0–36.0)
MCV: 88 fL (ref 80.0–100.0)
Platelet Count: 155 10*3/uL (ref 150–400)
RBC: 3.82 MIL/uL — ABNORMAL LOW (ref 3.87–5.11)
RDW: 15.2 % (ref 11.5–15.5)
WBC Count: 3.8 10*3/uL — ABNORMAL LOW (ref 4.0–10.5)
nRBC: 0 % (ref 0.0–0.2)

## 2024-10-24 ENCOUNTER — Encounter: Payer: Self-pay | Admitting: Nurse Practitioner

## 2024-12-13 ENCOUNTER — Ambulatory Visit: Attending: Surgery

## 2025-04-19 ENCOUNTER — Inpatient Hospital Stay: Admitting: Hematology

## 2025-04-19 ENCOUNTER — Inpatient Hospital Stay
